# Patient Record
Sex: Female | Born: 1955 | Race: White | Hispanic: No | Smoking: Former smoker
Health system: Southern US, Community
[De-identification: ages and names within clinical notes are randomized; demographics above are authoritative.]

## PROBLEM LIST (undated history)

## (undated) DIAGNOSIS — E119 Type 2 diabetes mellitus without complications: Secondary | ICD-10-CM

## (undated) DIAGNOSIS — B019 Varicella without complication: Secondary | ICD-10-CM

## (undated) DIAGNOSIS — F32A Depression, unspecified: Secondary | ICD-10-CM

## (undated) DIAGNOSIS — I10 Essential (primary) hypertension: Secondary | ICD-10-CM

## (undated) DIAGNOSIS — E785 Hyperlipidemia, unspecified: Secondary | ICD-10-CM

## (undated) DIAGNOSIS — R32 Unspecified urinary incontinence: Secondary | ICD-10-CM

## (undated) DIAGNOSIS — I639 Cerebral infarction, unspecified: Secondary | ICD-10-CM

## (undated) DIAGNOSIS — K219 Gastro-esophageal reflux disease without esophagitis: Secondary | ICD-10-CM

## (undated) DIAGNOSIS — Z85528 Personal history of other malignant neoplasm of kidney: Secondary | ICD-10-CM

## (undated) DIAGNOSIS — F329 Major depressive disorder, single episode, unspecified: Secondary | ICD-10-CM

## (undated) DIAGNOSIS — C801 Malignant (primary) neoplasm, unspecified: Secondary | ICD-10-CM

## (undated) DIAGNOSIS — K635 Polyp of colon: Secondary | ICD-10-CM

## (undated) HISTORY — DX: Hyperlipidemia, unspecified: E78.5

## (undated) HISTORY — DX: Malignant (primary) neoplasm, unspecified: C80.1

## (undated) HISTORY — DX: Gastro-esophageal reflux disease without esophagitis: K21.9

## (undated) HISTORY — DX: Varicella without complication: B01.9

## (undated) HISTORY — DX: Type 2 diabetes mellitus without complications: E11.9

## (undated) HISTORY — DX: Depression, unspecified: F32.A

## (undated) HISTORY — DX: Unspecified urinary incontinence: R32

## (undated) HISTORY — DX: Major depressive disorder, single episode, unspecified: F32.9

## (undated) HISTORY — DX: Personal history of other malignant neoplasm of kidney: Z85.528

## (undated) HISTORY — DX: Polyp of colon: K63.5

## (undated) HISTORY — DX: Essential (primary) hypertension: I10

## (undated) HISTORY — PX: NISSEN FUNDOPLICATION: SHX2091

## (undated) HISTORY — DX: Cerebral infarction, unspecified: I63.9

## (undated) HISTORY — PX: KNEE SURGERY: SHX244

---

## 1999-09-03 HISTORY — PX: CHOLECYSTECTOMY: SHX55

## 2000-08-05 ENCOUNTER — Inpatient Hospital Stay (HOSPITAL_COMMUNITY)
Admission: RE | Admit: 2000-08-05 | Discharge: 2000-08-12 | Payer: Self-pay | Admitting: Physical Medicine & Rehabilitation

## 2003-07-14 ENCOUNTER — Other Ambulatory Visit: Payer: Self-pay

## 2004-12-19 ENCOUNTER — Ambulatory Visit: Payer: Self-pay

## 2005-02-06 ENCOUNTER — Ambulatory Visit: Payer: Self-pay

## 2005-02-21 ENCOUNTER — Ambulatory Visit: Payer: Self-pay

## 2005-03-29 ENCOUNTER — Other Ambulatory Visit: Payer: Self-pay

## 2005-04-05 ENCOUNTER — Ambulatory Visit: Payer: Self-pay | Admitting: General Practice

## 2005-07-17 ENCOUNTER — Ambulatory Visit: Payer: Self-pay

## 2005-08-16 ENCOUNTER — Ambulatory Visit: Payer: Self-pay | Admitting: General Practice

## 2006-01-27 ENCOUNTER — Ambulatory Visit: Payer: Self-pay | Admitting: Gastroenterology

## 2006-07-10 ENCOUNTER — Ambulatory Visit: Payer: Self-pay

## 2006-08-11 ENCOUNTER — Inpatient Hospital Stay: Payer: Self-pay | Admitting: General Practice

## 2006-09-04 ENCOUNTER — Encounter: Payer: Self-pay | Admitting: General Practice

## 2007-07-01 ENCOUNTER — Ambulatory Visit: Payer: Self-pay

## 2007-07-14 ENCOUNTER — Ambulatory Visit: Payer: Self-pay | Admitting: Internal Medicine

## 2007-08-28 ENCOUNTER — Ambulatory Visit: Payer: Self-pay | Admitting: General Practice

## 2007-09-01 ENCOUNTER — Ambulatory Visit: Payer: Self-pay | Admitting: General Practice

## 2007-09-08 ENCOUNTER — Inpatient Hospital Stay: Payer: Self-pay | Admitting: General Practice

## 2007-09-30 ENCOUNTER — Encounter: Payer: Self-pay | Admitting: General Practice

## 2007-10-04 ENCOUNTER — Encounter: Payer: Self-pay | Admitting: General Practice

## 2007-11-01 ENCOUNTER — Encounter: Payer: Self-pay | Admitting: General Practice

## 2008-04-06 ENCOUNTER — Ambulatory Visit: Payer: Self-pay | Admitting: Internal Medicine

## 2008-08-30 ENCOUNTER — Ambulatory Visit: Payer: Self-pay | Admitting: Internal Medicine

## 2010-05-16 ENCOUNTER — Ambulatory Visit: Payer: Self-pay | Admitting: Family Medicine

## 2010-07-16 ENCOUNTER — Ambulatory Visit: Payer: Self-pay | Admitting: Gastroenterology

## 2010-07-18 LAB — PATHOLOGY REPORT

## 2011-06-25 ENCOUNTER — Ambulatory Visit: Payer: Self-pay | Admitting: Family Medicine

## 2012-06-26 ENCOUNTER — Ambulatory Visit (INDEPENDENT_AMBULATORY_CARE_PROVIDER_SITE_OTHER): Payer: 59 | Admitting: Internal Medicine

## 2012-06-26 ENCOUNTER — Encounter: Payer: Self-pay | Admitting: Internal Medicine

## 2012-06-26 VITALS — BP 140/80 | HR 64 | Temp 98.7°F | Ht 69.0 in | Wt 259.5 lb

## 2012-06-26 DIAGNOSIS — K589 Irritable bowel syndrome without diarrhea: Secondary | ICD-10-CM

## 2012-06-26 DIAGNOSIS — R739 Hyperglycemia, unspecified: Secondary | ICD-10-CM

## 2012-06-26 DIAGNOSIS — Z139 Encounter for screening, unspecified: Secondary | ICD-10-CM

## 2012-06-26 DIAGNOSIS — E119 Type 2 diabetes mellitus without complications: Secondary | ICD-10-CM

## 2012-06-26 DIAGNOSIS — I10 Essential (primary) hypertension: Secondary | ICD-10-CM

## 2012-06-26 DIAGNOSIS — R5383 Other fatigue: Secondary | ICD-10-CM

## 2012-06-26 DIAGNOSIS — K219 Gastro-esophageal reflux disease without esophagitis: Secondary | ICD-10-CM

## 2012-06-26 DIAGNOSIS — E7800 Pure hypercholesterolemia, unspecified: Secondary | ICD-10-CM

## 2012-06-26 DIAGNOSIS — R7309 Other abnormal glucose: Secondary | ICD-10-CM

## 2012-06-26 DIAGNOSIS — R5381 Other malaise: Secondary | ICD-10-CM

## 2012-06-26 DIAGNOSIS — Z23 Encounter for immunization: Secondary | ICD-10-CM

## 2012-06-26 NOTE — Patient Instructions (Signed)
It was nice meeting you today.  We are going to get an appt scheduled to follow up with Dr. Bluford Kaufmann.  Also, we will get your labs scheduled.  We will notify you of the results once they become available.

## 2012-06-28 ENCOUNTER — Encounter: Payer: Self-pay | Admitting: Internal Medicine

## 2012-06-28 DIAGNOSIS — E7800 Pure hypercholesterolemia, unspecified: Secondary | ICD-10-CM | POA: Insufficient documentation

## 2012-06-28 DIAGNOSIS — E119 Type 2 diabetes mellitus without complications: Secondary | ICD-10-CM | POA: Insufficient documentation

## 2012-06-28 DIAGNOSIS — K589 Irritable bowel syndrome without diarrhea: Secondary | ICD-10-CM | POA: Insufficient documentation

## 2012-06-28 DIAGNOSIS — K219 Gastro-esophageal reflux disease without esophagitis: Secondary | ICD-10-CM | POA: Insufficient documentation

## 2012-06-28 DIAGNOSIS — I10 Essential (primary) hypertension: Secondary | ICD-10-CM | POA: Insufficient documentation

## 2012-06-28 NOTE — Assessment & Plan Note (Signed)
With persistent diarrhea.  States bowels are stable.  Continues follow up with Dr Bluford Kaufmann.

## 2012-06-28 NOTE — Assessment & Plan Note (Signed)
Blood pressure elevated.  Have her spot check her pressures.  Get her back in soon to reassess.  If persistent elevation - will adjust meds.  Check metabolic panel.

## 2012-06-28 NOTE — Assessment & Plan Note (Signed)
States she is a "pre diabetic".  Low carb diet.  Weight loss.  Check met b and a1c.

## 2012-06-28 NOTE — Assessment & Plan Note (Signed)
Severe reflux and regurgitation - persistent.  Has seen Dr Bluford Kaufmann.  Has desired to postpone surgery.  Discussed at length with her today.  She desires referral back to Dr Bluford Kaufmann to discuss treatment.  Continues on Protonix.  Referral made.

## 2012-06-28 NOTE — Progress Notes (Signed)
Subjective:    Patient ID: Jacqueline Nichols, female    DOB: 04-08-1956, 56 y.o.   MRN: [id number]  HPI 56 year old female with past history of brainstem CVA, hypertension, "borderline diabetes", GERD and hypercholesterolemia who comes in today to follow up on these issues as well as to establish care.  She was previously being followed at [facility] by Gearldine Bienenstock.  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  Still some residual right sided changes, but overall has done well.  Has a lot of trouble with GERD and regurgitation.  Has been worked up extensively by Dr Bluford Kaufmann.  Needs surgery.  She has elected to postpone.  States the problem occurs with most every meal.  A lot of trouble with food regurgitation.  Also is lactose intolerant and has issues with IBS.  States has diarrhea - with 4-5 stools per day.  Unchanged.   Also sees Dr Ernest Pine.  Has had multiple knee surgeries.  Takes Mobic daily.  Discussed need to stop antiinflammatories.  Discussed using Tylenol instead.  She also describes a left side pain - upper left abdomen/flank.  States has been present since 1990.  Past Medical History  Diagnosis Date  . Cancer     skin  . Depression   . Diabetes mellitus   . GERD (gastroesophageal reflux disease)   . Hypertension   . Hyperlipidemia   . Chicken pox   . Colon polyps   . Stroke     brainstem  . Urinary incontinence     Review of Systems Patient denies any headache, lightheadedness or dizziness.  No chest pain, tightness or palpitations. No increased shortness of breath, cough or congestion.  No nausea or vomiting, but does have increased acid reflux and regurgitation as outlined above.  Abdomen and left side pain as outlined.  Loose stool and bowel issues as outlined.  No urine change.   Last period in her late 13s.  Is due for mammogram in November.  No previous abnormal paps or mammograms.       Objective:   Physical Exam Filed Vitals:   06/26/12 1521  BP: 140/80  Pulse:  64  Temp: 98.7 F (10.19 C)   56 year old female in no acute distress.   HEENT:  Nares - clear.  OP- without lesions or erythema.  NECK:  Supple, nontender.  No audible carotid bruit.   HEART:  Appears to be regular. LUNGS:  Without crackles or wheezing audible.  Respirations even and unlabored.   RADIAL PULSE:  Equal bilaterally.  ABDOMEN:  Soft, nontender.  No audible abdominal bruit.  No significant pain to palpation left side.   EXTREMITIES:  No increased edema to be present.                     Assessment & Plan:  GYN.  Postmenopausal.  Up to date with pap smears.  Will schedule for a pelvic/pap next visit.   ORTHO.  Knee pain as outlined.  Sees Dr Ernest Pine.  Is s/p eight knee surgeries.  Takes Mobic.  Discussed need to stop antiinflammatories.  Tylenol prn as directed.    LEFT SIDE PAIN.  No abnormality noted on exam.  Present for years.  Discussed possible abdominal ultrasound.  Will follow.  Hold on further w/up at this point.  Check routine labs.    FATIGUE.  Check cbc, met c and tsh.   HEALTH MAINTENANCE.  Schedule a physical for next visit.  Check cholesterol.  Schedule a mammogram.  States up to date with her colonoscopy.

## 2012-06-28 NOTE — Assessment & Plan Note (Signed)
On Simvastatin.  Check lipid panel and liver function.  Low cholesterol diet.

## 2012-06-30 ENCOUNTER — Telehealth: Payer: Self-pay | Admitting: Internal Medicine

## 2012-06-30 NOTE — Telephone Encounter (Signed)
Refill request for clonazepam 0.5 mg tab #60 Sig: take one tablet twice daily as needed Patient was seen on 06/26/12

## 2012-06-30 NOTE — Telephone Encounter (Signed)
Ok x 2  

## 2012-07-01 ENCOUNTER — Other Ambulatory Visit: Payer: Self-pay | Admitting: *Deleted

## 2012-07-01 MED ORDER — CLONAZEPAM 0.5 MG PO TABS: 0.5000 mg | ORAL_TABLET | Freq: Two times a day (BID) | ORAL | Status: DC | PRN

## 2012-07-01 NOTE — Telephone Encounter (Signed)
Refill request complete 

## 2012-07-02 ENCOUNTER — Telehealth: Payer: Self-pay | Admitting: *Deleted

## 2012-07-02 NOTE — Telephone Encounter (Signed)
Called prescription in to patient's regular pharmacy [facility] [address]. However Patient wanted her prescription for clonazepam called in to [facility]. I canceled with [facility] and called into [facility]. Patient informed.

## 2012-07-02 NOTE — Telephone Encounter (Signed)
Called patient to let her know that we are mailing her [facility] form to her home address.

## 2012-07-07 ENCOUNTER — Other Ambulatory Visit: Payer: Self-pay | Admitting: Internal Medicine

## 2012-07-08 LAB — COMPREHENSIVE METABOLIC PANEL
ALT: 15 IU/L (ref 0–32)
Albumin/Globulin Ratio: 1.7 (ref 1.1–2.5)
CO2: 22 mmol/L (ref 19–28)
Calcium: 9.1 mg/dL (ref 8.7–10.2)
Chloride: 100 mmol/L (ref 97–108)
GFR calc non Af Amer: 78 mL/min/{1.73_m2} (ref >59)
Glucose: 98 mg/dL (ref 65–99)
Potassium: 4 mmol/L (ref 3.5–5.2)
Total Protein: 6.3 g/dL (ref 6.0–8.5)

## 2012-07-08 LAB — CBC WITH DIFFERENTIAL
Basos: 0 % (ref 0–3)
Eosinophils Absolute: 0.1 10*3/uL (ref 0.0–0.4)
Hemoglobin: 10.5 g/dL — ABNORMAL LOW (ref 11.1–15.9)
Immature Grans (Abs): 0 10*3/uL (ref 0.0–0.1)
Immature Granulocytes: 0 % (ref 0–2)
Lymphs: 25 % (ref 14–46)
MCHC: 31.4 g/dL — ABNORMAL LOW (ref 31.5–35.7)
Neutrophils Relative %: 69 % (ref 40–74)
Platelets: 276 10*3/uL (ref 155–379)
RBC: 4.67 x10E6/uL (ref 3.77–5.28)

## 2012-07-08 LAB — TSH: TSH: 1.18 u[IU]/mL (ref 0.450–4.500)

## 2012-07-08 LAB — MICROALBUMIN / CREATININE URINE RATIO
Microalb Creat Ratio: 5.9 mg/g creat (ref 0.0–30.0)
Microalbumin, Urine: 9.6 ug/mL (ref 0.0–17.0)

## 2012-07-08 LAB — LIPID PANEL W/O CHOL/HDL RATIO
Cholesterol, Total: 242 mg/dL — ABNORMAL HIGH (ref 100–199)
HDL: 41 mg/dL (ref >39)
VLDL Cholesterol Cal: 33 mg/dL (ref 5–40)

## 2012-07-08 LAB — URINALYSIS, ROUTINE W REFLEX MICROSCOPIC
Bilirubin, UA: NEGATIVE
Nitrite, UA: NEGATIVE
Specific Gravity, UA: 1.022 (ref 1.005–1.030)
Urobilinogen, Ur: 0.2 mg/dL (ref 0.0–1.9)
pH, UA: 8.5 — ABNORMAL HIGH (ref 5.0–7.5)

## 2012-07-08 LAB — MICROSCOPIC EXAMINATION: Bacteria, UA: NONE SEEN

## 2012-07-11 ENCOUNTER — Other Ambulatory Visit: Payer: Self-pay | Admitting: Internal Medicine

## 2012-07-11 DIAGNOSIS — D649 Anemia, unspecified: Secondary | ICD-10-CM

## 2012-07-13 ENCOUNTER — Telehealth: Payer: Self-pay | Admitting: Internal Medicine

## 2012-07-13 MED ORDER — SIMVASTATIN 20 MG PO TABS: 20.0000 mg | ORAL_TABLET | Freq: Every day | ORAL | Status: DC

## 2012-07-13 NOTE — Telephone Encounter (Signed)
Pt takes 20 mg's once daily of the Symbestatin ??

## 2012-07-13 NOTE — Telephone Encounter (Signed)
Added med to med list.  Pt states she did not need refills.

## 2012-07-14 ENCOUNTER — Ambulatory Visit: Payer: Self-pay | Admitting: Internal Medicine

## 2012-07-17 ENCOUNTER — Ambulatory Visit: Payer: Self-pay | Admitting: Gastroenterology

## 2012-07-22 ENCOUNTER — Telehealth: Payer: Self-pay | Admitting: Internal Medicine

## 2012-07-22 NOTE — Telephone Encounter (Signed)
Lab order form completed and placed on your desk.  Also notify pt that I received her mammogram results and it is ok.

## 2012-07-22 NOTE — Telephone Encounter (Signed)
Patient needing an order for lab work at [facility].

## 2012-07-22 NOTE — Telephone Encounter (Signed)
Called patient to let her know. Patient wants [place] to fax lab to [facility]. Faxed and mailed a copy to patients home address. Patient notified.

## 2012-07-24 ENCOUNTER — Telehealth: Payer: Self-pay | Admitting: *Deleted

## 2012-07-24 ENCOUNTER — Other Ambulatory Visit: Payer: Self-pay | Admitting: Internal Medicine

## 2012-07-24 DIAGNOSIS — D649 Anemia, unspecified: Secondary | ICD-10-CM

## 2012-07-24 NOTE — Telephone Encounter (Signed)
Patient called and stated that [facility] did not receive fax and patient has not received in mail as of today

## 2012-07-31 ENCOUNTER — Encounter: Payer: Self-pay | Admitting: Internal Medicine

## 2012-08-04 NOTE — Telephone Encounter (Signed)
Encounter complete. 

## 2012-08-18 ENCOUNTER — Encounter: Payer: 59 | Admitting: Internal Medicine

## 2012-08-19 ENCOUNTER — Telehealth: Payer: Self-pay | Admitting: Internal Medicine

## 2012-08-19 NOTE — Telephone Encounter (Signed)
Notify pt hgb is stable.  Still low but stable.  Iron low.  Needs to start iron supplements if not already taking.  Can start ferrous sulfate 325mg  q day.  Would like to recheck hgb and ferritin in one month.   I will sign order for lab.  She gets her labs at [facility].

## 2012-08-20 ENCOUNTER — Ambulatory Visit: Payer: Self-pay | Admitting: Internal Medicine

## 2012-08-20 ENCOUNTER — Telehealth: Payer: Self-pay | Admitting: Internal Medicine

## 2012-08-20 NOTE — Telephone Encounter (Signed)
Called patient to let her know. Will mail lab request to patient

## 2012-08-20 NOTE — Telephone Encounter (Signed)
Plavix 75 mg  90 supply

## 2012-08-21 MED ORDER — CLOPIDOGREL BISULFATE 75 MG PO TABS: 75.0000 mg | ORAL_TABLET | Freq: Every day | ORAL | Status: DC

## 2012-08-21 NOTE — Telephone Encounter (Signed)
Called prescription in to pharmacy 

## 2012-09-09 ENCOUNTER — Ambulatory Visit: Payer: Self-pay | Admitting: Surgery

## 2012-09-22 ENCOUNTER — Emergency Department: Payer: Self-pay | Admitting: Emergency Medicine

## 2012-09-22 LAB — COMPREHENSIVE METABOLIC PANEL
Albumin: 3.9 g/dL (ref 3.4–5.0)
Alkaline Phosphatase: 136 U/L (ref 50–136)
Anion Gap: 9 (ref 7–16)
BUN: 18 mg/dL (ref 7–18)
Bilirubin,Total: 0.5 mg/dL (ref 0.2–1.0)
Calcium, Total: 8.9 mg/dL (ref 8.5–10.1)
Chloride: 102 mmol/L (ref 98–107)
Creatinine: 0.87 mg/dL (ref 0.60–1.30)
Osmolality: 271 (ref 275–301)
Total Protein: 7.6 g/dL (ref 6.4–8.2)

## 2012-09-22 LAB — URINALYSIS, COMPLETE
Bilirubin,UR: NEGATIVE
Nitrite: NEGATIVE
Protein: NEGATIVE
RBC,UR: <1 /HPF (ref 0–5)
Specific Gravity: 1.003 (ref 1.003–1.030)
Transitional Epi: <1
WBC UR: NONE SEEN /HPF (ref 0–5)

## 2012-09-22 LAB — CK TOTAL AND CKMB (NOT AT ARMC)
CK, Total: 91 U/L (ref 21–215)
CK-MB: <0.5 ng/mL — ABNORMAL LOW (ref 0.5–3.6)

## 2012-09-22 LAB — CBC
HCT: 35.6 % (ref 35.0–47.0)
Platelet: 361 10*3/uL (ref 150–440)
RDW: 17.7 % — ABNORMAL HIGH (ref 11.5–14.5)

## 2012-10-21 ENCOUNTER — Other Ambulatory Visit (HOSPITAL_COMMUNITY)
Admission: RE | Admit: 2012-10-21 | Discharge: 2012-10-21 | Disposition: A | Discharge status: Home / Self Care | Payer: 59 | Source: Ambulatory Visit | Attending: Internal Medicine | Admitting: Internal Medicine

## 2012-10-21 ENCOUNTER — Ambulatory Visit (INDEPENDENT_AMBULATORY_CARE_PROVIDER_SITE_OTHER): Payer: 59 | Admitting: Internal Medicine

## 2012-10-21 ENCOUNTER — Encounter: Payer: Self-pay | Admitting: Internal Medicine

## 2012-10-21 VITALS — BP 128/70 | HR 55 | Temp 98.3°F | Ht 69.0 in | Wt 253.2 lb

## 2012-10-21 DIAGNOSIS — K219 Gastro-esophageal reflux disease without esophagitis: Secondary | ICD-10-CM

## 2012-10-21 DIAGNOSIS — I10 Essential (primary) hypertension: Secondary | ICD-10-CM

## 2012-10-21 DIAGNOSIS — N841 Polyp of cervix uteri: Secondary | ICD-10-CM

## 2012-10-21 DIAGNOSIS — R000 Tachycardia, unspecified: Secondary | ICD-10-CM

## 2012-10-21 DIAGNOSIS — E119 Type 2 diabetes mellitus without complications: Secondary | ICD-10-CM

## 2012-10-21 DIAGNOSIS — D649 Anemia, unspecified: Secondary | ICD-10-CM

## 2012-10-21 DIAGNOSIS — Z1151 Encounter for screening for human papillomavirus (HPV): Secondary | ICD-10-CM | POA: Insufficient documentation

## 2012-10-21 DIAGNOSIS — K589 Irritable bowel syndrome without diarrhea: Secondary | ICD-10-CM

## 2012-10-21 DIAGNOSIS — Z0000 Encounter for general adult medical examination without abnormal findings: Secondary | ICD-10-CM

## 2012-10-21 DIAGNOSIS — Z01419 Encounter for gynecological examination (general) (routine) without abnormal findings: Secondary | ICD-10-CM | POA: Insufficient documentation

## 2012-10-21 DIAGNOSIS — E7800 Pure hypercholesterolemia, unspecified: Secondary | ICD-10-CM

## 2012-10-21 DIAGNOSIS — N390 Urinary tract infection, site not specified: Secondary | ICD-10-CM

## 2012-10-21 LAB — POCT URINALYSIS DIPSTICK
Blood, UA: NEGATIVE
Glucose, UA: NEGATIVE
Nitrite, UA: NEGATIVE
Protein, UA: NEGATIVE
Spec Grav, UA: 1.015
Urobilinogen, UA: 1
pH, UA: 7

## 2012-10-21 MED ORDER — CLOTRIMAZOLE-BETAMETHASONE 1-0.05 % EX CREA: TOPICAL_CREAM | Freq: Two times a day (BID) | CUTANEOUS | Status: DC

## 2012-10-23 ENCOUNTER — Ambulatory Visit: Payer: Self-pay | Admitting: Internal Medicine

## 2012-10-25 ENCOUNTER — Encounter: Payer: Self-pay | Admitting: Internal Medicine

## 2012-10-25 DIAGNOSIS — D649 Anemia, unspecified: Secondary | ICD-10-CM | POA: Insufficient documentation

## 2012-10-25 DIAGNOSIS — R000 Tachycardia, unspecified: Secondary | ICD-10-CM | POA: Insufficient documentation

## 2012-10-25 NOTE — Assessment & Plan Note (Signed)
Low carb diet.  Weight loss.  A1c 07/07/12 - 5.5.  Follow.

## 2012-10-25 NOTE — Assessment & Plan Note (Signed)
Severe reflux and regurgitation - persistent.  Has seen Dr Bluford Kaufmann.  Referred to Dr Katrinka Blazing.  See above.  Planning for lap hernia repair and fundoplication.  Awaiting manometry results.  Continue protonix.

## 2012-10-25 NOTE — Assessment & Plan Note (Signed)
Fasting lipid profile 07/07/12 revealed total cholesterol 242, triglycerides 167, HDL 41 and LDL 168.  Back on cholesterol medication now.  Follow lipid profile and liver function.

## 2012-10-25 NOTE — Assessment & Plan Note (Signed)
Blood pressure doing well now.  Continue to monitor.  Same medication regimen.

## 2012-10-25 NOTE — Assessment & Plan Note (Signed)
Last hgb 10.5.  Recheck cbc and iron studies to confirm stable.

## 2012-10-25 NOTE — Assessment & Plan Note (Signed)
Increased heart rate as outlined.  Seeing Dr Juliann Pares.  Obtain records.  Planning for stress test - end of this week.  Continue metoprolol.

## 2012-10-25 NOTE — Progress Notes (Signed)
Subjective:    Patient ID: Jacqueline Nichols, female    DOB: 1956-08-26, 57 y.o.   MRN: [id number]  HPI 57 year old female with past history of brainstem CVA, hypertension, "borderline diabetes", GERD and hypercholesterolemia who comes in today to follow up on these issues as well as for her complete physical exam.   Has had a lot of issues with GERD and swallowing.  See last note for details.  Was referred back to GI.  They referred her to Dr Katrinka Blazing.  Dr Katrinka Blazing ordered an UGI which revealed a small hiatal hernia and no acid reflux.  Gastric emptying was normal.  He recommended a lap hernia repair with fundoplication.  Scheduled a pre op manometry and a gastric emptying study.   She has had both.  Do not have results.  States she is waiting to hear from the manometry.  Does plan to follow through with the surgery.  On protonix.  Breathing stable.  Bowels stable.  No vaginal bleeding or problems.     Past Medical History  Diagnosis Date  . Cancer     skin  . Depression   . Diabetes mellitus   . GERD (gastroesophageal reflux disease)   . Hypertension   . Hyperlipidemia   . Chicken pox   . Colon polyps   . Stroke     brainstem  . Urinary incontinence     Current Outpatient Prescriptions on File Prior to Visit  Medication Sig Dispense Refill  . bisoprolol-hydrochlorothiazide (ZIAC) 2.5-6.25 MG per tablet Take 1 tablet by mouth daily.      . citalopram (CELEXA) 40 MG tablet Take 40 mg by mouth daily.      . clonazePAM (KLONOPIN) 0.5 MG tablet Take 1 tablet (0.5 mg total) by mouth 2 (two) times daily as needed.  30 tablet  2  . clopidogrel (PLAVIX) 75 MG tablet Take 1 tablet (75 mg total) by mouth daily.  90 tablet  1  . fluticasone (VERAMYST) 27.5 MCG/SPRAY nasal spray Place 2 sprays into the nose as needed.      . Misc Natural Products (ALLERGY RELEAF SYSTEM PO) Take by mouth. 24 hour relief      . pantoprazole (PROTONIX) 40 MG tablet Take 40 mg by mouth 2 (two) times daily.      . simvastatin  (ZOCOR) 20 MG tablet Take 1 tablet (20 mg total) by mouth at bedtime.  90 tablet  3  . torsemide (DEMADEX) 20 MG tablet Take 20 mg by mouth daily. As needed       No current facility-administered medications on file prior to visit.    Review of Systems Patient denies any headache, lightheadedness or dizziness.  While at work recently, she noticed increased palpitations.  Couldn't get a deep breath.  Lasted for a few hours.  Started feeling some heaviness.  Jaw discomfort.  Went to ER.  They got her rate under control  (rate on arrival - 160).  Sounds like she was given adenosine.  Followed up with Dr Juliann Pares.  Had ECHO.  Due to have stress test at the end of this week.   Started on metoprolol.  Having no acute symptoms now.  No increased shortness of breath now.  No cough or congestion.  No nausea or vomiting, but does have increased acid reflux and regurgitation as outlined above.  Bowels - she feels are stable.  No urine change.   Last period in her late 30s.  No vaginal bleeding or problems.  No previous abnormal paps or mammograms.       Objective:   Physical Exam  Filed Vitals:   10/21/12 1004  BP: 128/70  Pulse: 55  Temp: 98.3 F (36.8 C)   Blood pressure recheck:  122/72, pulse 63-30  57 year old female in no acute distress.   HEENT:  Nares- clear.  Oropharynx - without lesions. NECK:  Supple.  Nontender.  No audible bruit.  HEART:  Appears to be regular. LUNGS:  No crackles or wheezing audible.  Respirations even and unlabored.  RADIAL PULSE:  Equal bilaterally.    BREASTS:  No nipple discharge or nipple retraction present.  Could not appreciate any distinct nodules or axillary adenopathy.  ABDOMEN:  Soft, nontender.  Bowel sounds present and normal.  No audible abdominal bruit.  GU:  Normal external genitalia.  Vaginal vault without lesions.  Cervix identified.  Pap performed. Cervical polyp present.  Could not appreciate any adnexal masses or tenderness.   RECTAL:  Heme  negative.   EXTREMITIES:  No increased edema present.  DP pulses palpable and equal bilaterally.            Assessment & Plan:  GYN.  Postmenopausal.  No vaginal bleeding or spotting.  Pap today.  Cervical polyp present.  Refer to gyn for removal.     ORTHO.  Knee pain as outlined in previous note.  Sees Dr Ernest Pine.  Is s/p eight knee surgeries.  Appears to be off mobic.  Tylenol prn as directed.      HEALTH MAINTENANCE.  Physical today.  Pap today.  Mammogram 07/14/12 - BiRADS II.  States up to date with her colonoscopy.

## 2012-10-25 NOTE — Assessment & Plan Note (Signed)
Bowels stable currently.  Follow.  Sees GI.

## 2012-10-27 ENCOUNTER — Telehealth: Payer: Self-pay | Admitting: *Deleted

## 2012-10-27 MED ORDER — AMOXICILLIN 875 MG PO TABS: 875.0000 mg | ORAL_TABLET | Freq: Two times a day (BID) | ORAL | Status: DC

## 2012-10-27 NOTE — Telephone Encounter (Signed)
Patient informed of lab results and rx sent to pharmacy on file per patient request.

## 2012-10-27 NOTE — Telephone Encounter (Signed)
Message copied by Theola Sequin on Tue Oct 27, 2012  4:27 PM ------      Message from: Charm Barges      Created: Mon Oct 26, 2012  3:17 AM       Notify pt urine cx positive.  Confirm no allergy to penicillin or amoxicillin.  If no, then amoxicillin 875mg  bid x 1 weeks.  Also pap ok. ------

## 2012-11-03 ENCOUNTER — Telehealth: Payer: Self-pay | Admitting: Internal Medicine

## 2012-11-03 NOTE — Telephone Encounter (Signed)
simvastatin (ZOCOR) 20 MG tablet  #90   torsemide (DEMADEX) 20 MG tablet  #90

## 2012-11-04 ENCOUNTER — Telehealth: Payer: Self-pay | Admitting: Internal Medicine

## 2012-11-04 ENCOUNTER — Other Ambulatory Visit: Payer: Self-pay | Admitting: *Deleted

## 2012-11-04 MED ORDER — TORSEMIDE 20 MG PO TABS: 20.0000 mg | ORAL_TABLET | Freq: Every day | ORAL | Status: DC

## 2012-11-04 MED ORDER — SIMVASTATIN 20 MG PO TABS: 20.0000 mg | ORAL_TABLET | Freq: Every day | ORAL | Status: DC

## 2012-11-04 NOTE — Telephone Encounter (Signed)
Called to confirm Rx for simvastatin and torsemide.  Did confirm from med list.

## 2012-11-04 NOTE — Telephone Encounter (Signed)
Already sent in to pharmacy. 

## 2012-11-04 NOTE — Telephone Encounter (Signed)
Script for zocor and torsemide filled

## 2012-11-04 NOTE — Telephone Encounter (Signed)
Noted  

## 2012-11-12 ENCOUNTER — Encounter: Payer: Self-pay | Admitting: Internal Medicine

## 2012-11-18 ENCOUNTER — Telehealth: Payer: Self-pay | Admitting: Internal Medicine

## 2012-11-18 ENCOUNTER — Other Ambulatory Visit: Payer: Self-pay | Admitting: Internal Medicine

## 2012-11-18 NOTE — Telephone Encounter (Signed)
Pt states she was seen a couple weeks ago and has a UTI.  Pt states she has finished her antibiotic but does not think it worked.  Pt asking if we can call in something different to [facility] today because this has been going on for a good while.

## 2012-11-18 NOTE — Telephone Encounter (Signed)
The urine culture was obtained on 2/19.  This has been a month.  If she is still having symptoms - she needs reevaluated.  If urgent, sx - eval today.  If not, will need to find a work in spot where I can see her for this problem.

## 2012-11-19 NOTE — Telephone Encounter (Signed)
Pt returned call 3/19 at 5:pm.  Pt states she cannot come in until Friday and has to be 1:30 p.m. Or later.   Please advise if Dr. Lorin Picket can work her in and I can add her to the schedule.

## 2012-11-19 NOTE — Telephone Encounter (Signed)
Please advise 

## 2012-11-19 NOTE — Telephone Encounter (Signed)
I can see her at 1:30 tomorrow - Friday (11/20/12).  Work in for this.  Will need to give urine sample prior to me seeing

## 2012-11-19 NOTE — Telephone Encounter (Signed)
Pt aware of appointment 

## 2012-11-20 ENCOUNTER — Encounter: Payer: Self-pay | Admitting: Internal Medicine

## 2012-11-20 ENCOUNTER — Ambulatory Visit (INDEPENDENT_AMBULATORY_CARE_PROVIDER_SITE_OTHER): Payer: 59 | Admitting: Internal Medicine

## 2012-11-20 VITALS — BP 110/70 | HR 56 | Temp 98.6°F | Ht 69.0 in | Wt 256.5 lb

## 2012-11-20 DIAGNOSIS — K219 Gastro-esophageal reflux disease without esophagitis: Secondary | ICD-10-CM

## 2012-11-20 DIAGNOSIS — I10 Essential (primary) hypertension: Secondary | ICD-10-CM

## 2012-11-20 DIAGNOSIS — E119 Type 2 diabetes mellitus without complications: Secondary | ICD-10-CM

## 2012-11-20 DIAGNOSIS — N390 Urinary tract infection, site not specified: Secondary | ICD-10-CM

## 2012-11-20 DIAGNOSIS — L989 Disorder of the skin and subcutaneous tissue, unspecified: Secondary | ICD-10-CM

## 2012-11-20 DIAGNOSIS — D649 Anemia, unspecified: Secondary | ICD-10-CM

## 2012-11-20 LAB — POCT URINALYSIS DIPSTICK
Bilirubin, UA: NEGATIVE
Ketones, UA: NEGATIVE
Protein, UA: NEGATIVE
pH, UA: 6.5

## 2012-11-20 MED ORDER — NITROFURANTOIN MONOHYD MACRO 100 MG PO CAPS: 100.0000 mg | ORAL_CAPSULE | Freq: Two times a day (BID) | ORAL | Status: DC

## 2012-11-22 ENCOUNTER — Encounter: Payer: Self-pay | Admitting: Internal Medicine

## 2012-11-22 LAB — URINE CULTURE

## 2012-11-22 NOTE — Assessment & Plan Note (Signed)
Severe reflux and regurgitation - persistent.  Has seen Dr Bluford Kaufmann.  Referred to Dr Katrinka Blazing.  See above.  Planning for lap hernia repair and fundoplication.   Continue protonix.

## 2012-11-22 NOTE — Assessment & Plan Note (Signed)
Blood pressure doing well now.  Continue to monitor.  Same medication regimen.

## 2012-11-22 NOTE — Assessment & Plan Note (Signed)
Low carb diet.  Weight loss.  A1c 07/07/12 - 5.5.  Follow.

## 2012-11-22 NOTE — Assessment & Plan Note (Signed)
Last hgb 10.5.  Recheck cbc and iron studies to confirm stable.

## 2012-11-22 NOTE — Progress Notes (Addendum)
Subjective:    Patient ID: Jacqueline Nichols, female    DOB: [birth year], 57 y.o.   MRN: [id number]  Urinary Tract Infection   57 year old female with past history of brainstem CVA, hypertension, "borderline diabetes", GERD and hypercholesterolemia who comes in today as a work in with concerns regarding a urinary tract infection.  Evaluated by Dr Katrinka Blazing previously.  He recommended a lap hernia repair with fundoplication. On protonix.  Breathing stable.  Bowels stable.  No vaginal bleeding or problems.  She does report that starting approximately five days ago she noticed increased odor.  Urine cloudy.  Then developed dysuria.  Some increased discomfort over her bladder.  Drinking cranberry juice.  Has been a little better the last couple of days.  Is planning for the upcoming surgery and wants to get this cleared prior to the surgery.    Past Medical History  Diagnosis Date  . Cancer     skin  . Depression   . Diabetes mellitus   . GERD (gastroesophageal reflux disease)   . Hypertension   . Hyperlipidemia   . Chicken pox   . Colon polyps   . Stroke     brainstem  . Urinary incontinence     Current Outpatient Prescriptions on File Prior to Visit  Medication Sig Dispense Refill  . amoxicillin (AMOXIL) 875 MG tablet Take 1 tablet (875 mg total) by mouth 2 (two) times daily. Take 1 tablet twice a day for 7 days  14 tablet  0  . bisoprolol-hydrochlorothiazide (ZIAC) 2.5-6.25 MG per tablet Take 1 tablet by mouth daily.      . citalopram (CELEXA) 40 MG tablet Take 40 mg by mouth daily.      . clonazePAM (KLONOPIN) 0.5 MG tablet Take 1 tablet (0.5 mg total) by mouth 2 (two) times daily as needed.  30 tablet  2  . clopidogrel (PLAVIX) 75 MG tablet Take 1 tablet (75 mg total) by mouth daily.  90 tablet  1  . clotrimazole-betamethasone (LOTRISONE) cream Apply topically 2 (two) times daily.  30 g  0  . fluticasone (VERAMYST) 27.5 MCG/SPRAY nasal spray Place 2 sprays into the nose as needed.      .  Misc Natural Products (ALLERGY RELEAF SYSTEM PO) Take by mouth. 24 hour relief      . pantoprazole (PROTONIX) 40 MG tablet Take 40 mg by mouth 2 (two) times daily.      . simvastatin (ZOCOR) 20 MG tablet Take 1 tablet (20 mg total) by mouth at bedtime.  90 tablet  3  . torsemide (DEMADEX) 20 MG tablet Take 1 tablet (20 mg total) by mouth daily. As needed  30 tablet  2   No current facility-administered medications on file prior to visit.    Review of Systems Patient denies any headache, lightheadedness or dizziness.  No sinus or allergy symptoms.  Seeing Dr Juliann Pares for her palpitations.  See last note for details.  States her cardiac w/up checked out fine.  Does report the urinary symptoms as outlined.  No vaginal itching or discharge.  Now bowel change.  No abdominal pain or cramping.  She does report the suprapubic discomfort.  Better after AZO.  No back pain.       Objective:   Physical Exam  Filed Vitals:   11/20/12 1337  BP: 110/70  Pulse: 56  Temp: 98.6 F (98 C)   57 year old female in no acute distress. HEART:  Appears to be regular. LUNGS:  No crackles or wheezing audible.  Respirations even and unlabored.  RADIAL PULSE:  Equal bilaterally.     ABDOMEN:  Soft, nontender.  Bowel sounds present and normal.  No audible abdominal bruit.  BACK:  No CVA tenderness.  No back pain with palpation.   EXTREMITIES:  No increased edema present.  DP pulses palpable and equal bilaterally.            Assessment & Plan:  UTI.  Some changes on the urine dip c/w uti.   Stay hydrated.  Send for culture.  Treat with macrobid.  Follow.  Discussed possible need for urology referral if persistent reoccurring urinary tract infections.   GYN.  Postmenopausal.  No vaginal bleeding or spotting.  Was referred to GYN for cervical polyp.  Obtain records.    ORTHO.  Knee pain as outlined in previous note.  Sees Dr Ernest Pine.  Is s/p eight knee surgeries.  Appears to be off mobic.  Tylenol prn as directed.     DERMATOLOGY.  Persistent right leg lesion.  Refer to dermatology for evaluation.   HEALTH MAINTENANCE.  Physical last visit.  Mammogram 07/14/12 - BiRADS II.  States up to date with her colonoscopy.

## 2012-11-23 ENCOUNTER — Other Ambulatory Visit: Payer: Self-pay | Admitting: Internal Medicine

## 2012-11-23 DIAGNOSIS — N390 Urinary tract infection, site not specified: Secondary | ICD-10-CM

## 2012-11-23 NOTE — Progress Notes (Signed)
Order placed for follow up urine 

## 2012-12-04 ENCOUNTER — Other Ambulatory Visit (INDEPENDENT_AMBULATORY_CARE_PROVIDER_SITE_OTHER): Payer: 59

## 2012-12-04 DIAGNOSIS — N390 Urinary tract infection, site not specified: Secondary | ICD-10-CM

## 2012-12-04 LAB — POCT URINALYSIS DIPSTICK
Nitrite, UA: NEGATIVE
Protein, UA: NEGATIVE
Urobilinogen, UA: 0.2
pH, UA: 5.5

## 2012-12-06 LAB — URINE CULTURE: Organism ID, Bacteria: NO GROWTH

## 2012-12-14 ENCOUNTER — Telehealth: Payer: Self-pay

## 2012-12-14 NOTE — Telephone Encounter (Signed)
Patient was notified about her lab results... Hemoglobin has improved and to continue iron. Total and Body Cholesterol increased and patient stated that she is still taking her simvastatin 20 mg. Dr. Lorin Picket did you want to change to Lipitor 20 mg q day. She also wanted to know if we could send her lab results to Dr. Malissa Hippo at the [facility] office patient is suppose to have surgery. She uses [facility]

## 2012-12-15 MED ORDER — ATORVASTATIN CALCIUM 20 MG PO TABS: 20.0000 mg | ORAL_TABLET | Freq: Every day | ORAL | Status: DC

## 2012-12-15 NOTE — Telephone Encounter (Signed)
Notify pt I sent a prescription to [facility] for generic lipitor.  I want her to take this instead of her simvastatin.  Will need a follow up liver panel check in 6 weeks since starting a new cholesterol medication. [facility] form on desk for order.

## 2012-12-15 NOTE — Telephone Encounter (Signed)
Pt was told about order and she said she would like for me to mail it to her.

## 2012-12-15 NOTE — Addendum Note (Signed)
Addended by: Charm Barges on: 12/15/2012 04:04 AM   Modules accepted: Orders, Medications

## 2012-12-15 NOTE — Telephone Encounter (Signed)
Order on your desk to send to her or for her to pick up.

## 2012-12-15 NOTE — Telephone Encounter (Signed)
Called pt to let her know her medication of the Lipitor has been sent to her pharmacy [facility]. And i also told her that she will need a liver panel check in 6 weeks, the patient said she would set up her own lab appointment.

## 2013-01-07 ENCOUNTER — Encounter: Payer: Self-pay | Admitting: Internal Medicine

## 2013-01-19 ENCOUNTER — Ambulatory Visit: Payer: Self-pay | Admitting: Surgery

## 2013-02-01 ENCOUNTER — Other Ambulatory Visit: Payer: Self-pay | Admitting: Internal Medicine

## 2013-02-01 ENCOUNTER — Other Ambulatory Visit: Payer: Self-pay | Admitting: *Deleted

## 2013-02-01 MED ORDER — PANTOPRAZOLE SODIUM 40 MG PO TBEC: 40.0000 mg | DELAYED_RELEASE_TABLET | Freq: Two times a day (BID) | ORAL | Status: DC

## 2013-02-01 MED ORDER — CLONAZEPAM 0.5 MG PO TABS: 0.5000 mg | ORAL_TABLET | Freq: Two times a day (BID) | ORAL | Status: DC | PRN

## 2013-02-01 NOTE — Telephone Encounter (Signed)
Ok to refill 

## 2013-02-01 NOTE — Telephone Encounter (Signed)
Refilled clonazepam .5mg #30 with one refill 

## 2013-02-01 NOTE — Telephone Encounter (Signed)
Rx faxed to pharmacy  

## 2013-02-09 ENCOUNTER — Ambulatory Visit: Payer: Self-pay | Admitting: Surgery

## 2013-02-26 ENCOUNTER — Ambulatory Visit: Payer: 59 | Admitting: Internal Medicine

## 2013-04-16 ENCOUNTER — Ambulatory Visit (INDEPENDENT_AMBULATORY_CARE_PROVIDER_SITE_OTHER): Payer: 59 | Admitting: Internal Medicine

## 2013-04-16 ENCOUNTER — Other Ambulatory Visit: Payer: Self-pay | Admitting: Internal Medicine

## 2013-04-16 ENCOUNTER — Encounter: Payer: Self-pay | Admitting: Internal Medicine

## 2013-04-16 VITALS — BP 130/80 | HR 59 | Temp 98.8°F | Ht 69.0 in | Wt 255.2 lb

## 2013-04-16 DIAGNOSIS — K589 Irritable bowel syndrome without diarrhea: Secondary | ICD-10-CM

## 2013-04-16 DIAGNOSIS — I10 Essential (primary) hypertension: Secondary | ICD-10-CM

## 2013-04-16 DIAGNOSIS — R000 Tachycardia, unspecified: Secondary | ICD-10-CM

## 2013-04-16 DIAGNOSIS — K219 Gastro-esophageal reflux disease without esophagitis: Secondary | ICD-10-CM

## 2013-04-16 DIAGNOSIS — E119 Type 2 diabetes mellitus without complications: Secondary | ICD-10-CM

## 2013-04-16 DIAGNOSIS — D649 Anemia, unspecified: Secondary | ICD-10-CM

## 2013-04-16 DIAGNOSIS — E7800 Pure hypercholesterolemia, unspecified: Secondary | ICD-10-CM

## 2013-04-16 DIAGNOSIS — R197 Diarrhea, unspecified: Secondary | ICD-10-CM

## 2013-04-18 ENCOUNTER — Encounter: Payer: Self-pay | Admitting: Internal Medicine

## 2013-04-18 DIAGNOSIS — R197 Diarrhea, unspecified: Secondary | ICD-10-CM | POA: Insufficient documentation

## 2013-04-18 NOTE — Assessment & Plan Note (Signed)
Recheck cbc and iron studies to confirm stable.

## 2013-04-18 NOTE — Assessment & Plan Note (Signed)
Low carb diet.  Weight loss.  A1c 07/07/12 - 5.5.  Follow.

## 2013-04-18 NOTE — Assessment & Plan Note (Signed)
Blood pressure doing well now.  Continue to monitor.  Same medication regimen.

## 2013-04-18 NOTE — Assessment & Plan Note (Signed)
Seeing Dr Juliann Pares.  Continue metoprolol.  Stable.

## 2013-04-18 NOTE — Assessment & Plan Note (Signed)
S/p surgery.  Acid reflux and swallowing better.  Continue protonix.   

## 2013-04-18 NOTE — Assessment & Plan Note (Signed)
Persistent.  Check stool for C. Diff, O&P, routine culture and wbc's.  Also check routine labs including liver panel, cbc and metabolic panel.  Refer back to GI for evaluation.

## 2013-04-18 NOTE — Progress Notes (Signed)
Subjective:    Patient ID: Jacqueline Nichols, female    DOB: 1956/07/03, 57 y.o.   MRN: [id number]  HPI 57 year old female with past history of brainstem CVA, hypertension, "borderline diabetes", GERD and hypercholesterolemia who comes in today for a scheduled follow up.  Has had a lot of issues with GERD and swallowing.  See previous notes for details.  Was referred back to GI.  They referred her to Dr Katrinka Blazing.  Dr Katrinka Blazing ordered an UGI which revealed a small hiatal hernia and no acid reflux.  Gastric emptying was normal.  He recommended a lap hernia repair with fundoplication.  She is s/p surgery - 6/14.  She states she has had increased pain and nausea.  This has improved.  Still will occasionally feel things are getting stuck, but is better.  Her main complaint is that of diarrhea.  Persistent.  Described as watery diarrhea.  Multiple episodes per day.  Is eating and drinking.  No vomiting.  Taking immodium.  Dr Katrinka Blazing aware.  Gave her dicyclomine.  Helps some to "relax" her bowel.      Past Medical History  Diagnosis Date  . Cancer     skin  . Depression   . Diabetes mellitus   . GERD (gastroesophageal reflux disease)   . Hypertension   . Hyperlipidemia   . Chicken pox   . Colon polyps   . Stroke     brainstem  . Urinary incontinence     Current Outpatient Prescriptions on File Prior to Visit  Medication Sig Dispense Refill  . atorvastatin (LIPITOR) 20 MG tablet Take 1 tablet (20 mg total) by mouth daily.  30 tablet  2  . bisoprolol-hydrochlorothiazide (ZIAC) 2.5-6.25 MG per tablet Take 1 tablet by mouth daily.      . citalopram (CELEXA) 40 MG tablet Take 40 mg by mouth daily.      . clonazePAM (KLONOPIN) 0.5 MG tablet Take 1 tablet (0.5 mg total) by mouth 2 (two) times daily as needed.  30 tablet  1  . clopidogrel (PLAVIX) 75 MG tablet TAKE ONE (1) TABLET EACH DAY  90 tablet  1  . fluticasone (VERAMYST) 27.5 MCG/SPRAY nasal spray Place 2 sprays into the nose as needed.      . metoprolol  succinate (TOPROL-XL) 25 MG 24 hr tablet Take 25 mg by mouth 2 (two) times daily.      . Misc Natural Products (ALLERGY RELEAF SYSTEM PO) Take by mouth. 24 hour relief      . pantoprazole (PROTONIX) 40 MG tablet Take 1 tablet (40 mg total) by mouth 2 (two) times daily.  60 tablet  5  . torsemide (DEMADEX) 20 MG tablet TAKE ONE (1) TABLET EACH DAY AS NEEDED  90 tablet  1   No current facility-administered medications on file prior to visit.    Review of Systems Patient denies any headache, lightheadedness or dizziness. No sinus or allergy symptoms.  No chest pain, tightness or palpitations.  No increased shortness of breath, cough or congestion.  Reflux has improved.  Swallowing has improved.  See above.  No significant abdominal pain or cramping.  Does report increased persistent watery diarrhea.  No BRBPR or melana.  No urine change.        Objective:   Physical Exam  Filed Vitals:   04/16/13 1159  BP: 130/80  Pulse: 59  Temp: 98.8 F (12.33 C)   57 year old female in no acute distress.   HEENT:  Nares-  clear.  Oropharynx - without lesions. NECK:  Supple.  Nontender.  No audible bruit.  HEART:  Appears to be regular. LUNGS:  No crackles or wheezing audible.  Respirations even and unlabored.  RADIAL PULSE:  Equal bilaterally.  ABDOMEN:  Soft, nontender.  Bowel sounds present and normal.  No audible abdominal bruit.    EXTREMITIES:  No increased edema present.  DP pulses palpable and equal bilaterally.            Assessment & Plan:  GYN.  Postmenopausal.  No vaginal bleeding or spotting.  Pap last visit.  Cervical polyp present.  Referred to gyn for removal.     ORTHO.  Knee pain as outlined in previous note.  Sees Dr Ernest Pine.  Is s/p eight knee surgeries.  Appears to be off mobic.  Tylenol prn as directed.      HEALTH MAINTENANCE.  Physical last visit.  Mammogram 07/14/12 - BiRADS II.  States up to date with her colonoscopy.

## 2013-04-18 NOTE — Assessment & Plan Note (Signed)
Diarrhea as outlined.  W/up planned as outlined.  Refer back to GI.

## 2013-04-18 NOTE — Assessment & Plan Note (Signed)
Low cholesterol diet.  Back on cholesterol medication now.  Follow lipid profile and liver function.

## 2013-04-19 LAB — BASIC METABOLIC PANEL
BUN: 11 mg/dL (ref 6–24)
Calcium: 9.5 mg/dL (ref 8.7–10.2)
Chloride: 101 mmol/L (ref 97–108)
GFR calc non Af Amer: 91 mL/min/{1.73_m2} (ref >59)
Glucose: 89 mg/dL (ref 65–99)
Potassium: 4.3 mmol/L (ref 3.5–5.2)

## 2013-04-19 LAB — CBC WITH DIFFERENTIAL
Basos: 0 % (ref 0–3)
Eosinophils Absolute: 0.2 10*3/uL (ref 0.0–0.4)
Hemoglobin: 13.1 g/dL (ref 11.1–15.9)
Immature Grans (Abs): 0 10*3/uL (ref 0.0–0.1)
Lymphs: 38 % (ref 14–46)
MCHC: 34.5 g/dL (ref 31.5–35.7)
Monocytes: 7 % (ref 4–12)
Neutrophils Relative %: 52 % (ref 40–74)
RBC: 4.75 x10E6/uL (ref 3.77–5.28)
WBC: 6.4 10*3/uL (ref 3.4–10.8)

## 2013-04-19 LAB — HEPATIC FUNCTION PANEL
ALT: 16 IU/L (ref 0–32)
Alkaline Phosphatase: 133 IU/L — ABNORMAL HIGH (ref 39–117)
Bilirubin, Direct: 0.13 mg/dL (ref 0.00–0.40)

## 2013-04-19 LAB — TSH: TSH: 1.16 u[IU]/mL (ref 0.450–4.500)

## 2013-04-20 ENCOUNTER — Encounter: Payer: Self-pay | Admitting: *Deleted

## 2013-04-26 ENCOUNTER — Telehealth: Payer: Self-pay | Admitting: Internal Medicine

## 2013-04-26 NOTE — Telephone Encounter (Signed)
She needs eval if weak and going 17 times.  Probably needs fluids.  Needs to let Dr Katrinka Blazing know - since occurred s/p her surgery.  I have not seen her lab results.

## 2013-04-26 NOTE — Telephone Encounter (Signed)
Pt notified of lab results

## 2013-04-26 NOTE — Telephone Encounter (Signed)
Spoke with pt & she stated that she has been to the restroom 17 times since yesterday & really needs some relief. So feels weak. Pt states that she is staying hydrated. She also was hoping to get the results from her stool test done through [facility] (I have not seen them & told her I would check with you before requesting them)

## 2013-04-26 NOTE — Telephone Encounter (Signed)
The patient is wanting a call from Dr. Lorin Picket.

## 2013-04-26 NOTE — Telephone Encounter (Signed)
Also aware that we are waiting on the final results from the C. Diff test. Pt also informed of Dr. Marina Goodell recommendations

## 2013-04-28 ENCOUNTER — Telehealth: Payer: Self-pay | Admitting: *Deleted

## 2013-04-28 NOTE — Telephone Encounter (Signed)
Pt informed that C-diff was not ran & that she needs to have it done. She would like to have ot done through [facility] again. New order on your desk

## 2013-04-28 NOTE — Telephone Encounter (Signed)
See lab note.  They ran all other stool tests and these were negative.  C. Diff not run.  She does need to have this test done.  Does she want to get here?  Let me know and I will place order.

## 2013-04-28 NOTE — Telephone Encounter (Signed)
Final report received and placed on your desk

## 2013-04-28 NOTE — Telephone Encounter (Signed)
Order signed and placed in your box.   

## 2013-04-29 LAB — OVA AND PARASITE EXAMINATION

## 2013-04-29 LAB — FECAL LEUKOCYTES

## 2013-04-29 LAB — CLOSTRIDIUM DIFFICILE BY PCR

## 2013-04-29 NOTE — Telephone Encounter (Signed)
Faxed to [address]

## 2013-04-30 NOTE — Telephone Encounter (Signed)
Error, note already started

## 2013-05-05 ENCOUNTER — Encounter: Payer: Self-pay | Admitting: *Deleted

## 2013-05-05 NOTE — Telephone Encounter (Signed)
Noted.  I called GI and they are gong to see her next Tuesday (I think pt is already aware).  Let me know if she needs anything more from me.

## 2013-05-05 NOTE — Telephone Encounter (Signed)
Pt asking for call from Dr. Lorin Picket.  States she received her results via email, negative.  Pt states something is wrong with her.  Pt states she cannot eat anything without having to run to the bathroom with diarrhea.

## 2013-05-05 NOTE — Telephone Encounter (Signed)
Gave pt number to try check & see if they have had any cancellations. If no luck, pt advised to call us back

## 2013-05-05 NOTE — Telephone Encounter (Signed)
appt is not until 9/15 @ 9am with GI-pt will try calling today to move appt up, if not luck she will give [place] another call to see if we can move it up

## 2013-05-05 NOTE — Telephone Encounter (Signed)
See below

## 2013-05-05 NOTE — Telephone Encounter (Signed)
Please notify pt that I am not in the office this pm.  I do not mind calling her.  Need to know if she has seen GI yet.  We referred her for persistent diarrhea.  Thanks.  Would like to know this before calling her.

## 2013-05-14 ENCOUNTER — Ambulatory Visit: Payer: Self-pay | Admitting: Gastroenterology

## 2013-05-17 LAB — PATHOLOGY REPORT

## 2013-05-19 ENCOUNTER — Encounter: Payer: Self-pay | Admitting: Internal Medicine

## 2013-05-21 ENCOUNTER — Encounter: Payer: Self-pay | Admitting: Internal Medicine

## 2013-05-21 DIAGNOSIS — K589 Irritable bowel syndrome without diarrhea: Secondary | ICD-10-CM

## 2013-05-31 ENCOUNTER — Telehealth: Payer: Self-pay | Admitting: *Deleted

## 2013-05-31 MED ORDER — CITALOPRAM HYDROBROMIDE 40 MG PO TABS: 40.0000 mg | ORAL_TABLET | Freq: Every day | ORAL | Status: DC

## 2013-05-31 MED ORDER — BISOPROLOL-HYDROCHLOROTHIAZIDE 2.5-6.25 MG PO TABS: 1.0000 | ORAL_TABLET | Freq: Every day | ORAL | Status: DC

## 2013-05-31 NOTE — Telephone Encounter (Signed)
I am confused.  Need to clarify with pharmacy.  This is first message I can find for refill.  Also, it appears that some of these (i.e. Plavix) that she has refills.  I do not prescribe the meloxicam.  She is on plavix and has a lot of GI issues and I do not recommend her taking the mobic.  Would use tylenol instead.  If needs refills I am ok with refilling the other three medications x 4 months.

## 2013-05-31 NOTE — Telephone Encounter (Signed)
Pharmacist stated they have faxed the office 3 times with no response for refill on the following medications.   Plavix  Meloxicam   Citalopram  Bisoprolol-HCTZ

## 2013-05-31 NOTE — Telephone Encounter (Signed)
Spoke with pharmacist, pt picked up Plavix on 9/25 #90. Sent in a refill on the Citalopram & Bisoprolol. Did not send in Meloxicam (see below for reason)

## 2013-06-01 ENCOUNTER — Encounter: Payer: Self-pay | Admitting: *Deleted

## 2013-06-03 ENCOUNTER — Encounter: Payer: Self-pay | Admitting: Internal Medicine

## 2013-06-03 DIAGNOSIS — K589 Irritable bowel syndrome without diarrhea: Secondary | ICD-10-CM

## 2013-06-04 ENCOUNTER — Encounter: Payer: Self-pay | Admitting: Internal Medicine

## 2013-07-08 ENCOUNTER — Other Ambulatory Visit: Payer: Self-pay

## 2013-07-21 ENCOUNTER — Ambulatory Visit: Payer: 59 | Admitting: Internal Medicine

## 2013-08-20 ENCOUNTER — Ambulatory Visit (INDEPENDENT_AMBULATORY_CARE_PROVIDER_SITE_OTHER): Payer: 59 | Admitting: Internal Medicine

## 2013-08-20 ENCOUNTER — Encounter: Payer: Self-pay | Admitting: Internal Medicine

## 2013-08-20 VITALS — BP 110/70 | HR 46 | Temp 98.6°F | Ht 69.0 in | Wt 236.0 lb

## 2013-08-20 DIAGNOSIS — I498 Other specified cardiac arrhythmias: Secondary | ICD-10-CM

## 2013-08-20 DIAGNOSIS — E119 Type 2 diabetes mellitus without complications: Secondary | ICD-10-CM

## 2013-08-20 DIAGNOSIS — K219 Gastro-esophageal reflux disease without esophagitis: Secondary | ICD-10-CM

## 2013-08-20 DIAGNOSIS — I10 Essential (primary) hypertension: Secondary | ICD-10-CM

## 2013-08-20 DIAGNOSIS — R001 Bradycardia, unspecified: Secondary | ICD-10-CM

## 2013-08-20 DIAGNOSIS — R197 Diarrhea, unspecified: Secondary | ICD-10-CM

## 2013-08-20 DIAGNOSIS — D649 Anemia, unspecified: Secondary | ICD-10-CM

## 2013-08-20 DIAGNOSIS — E7800 Pure hypercholesterolemia, unspecified: Secondary | ICD-10-CM

## 2013-08-20 DIAGNOSIS — K589 Irritable bowel syndrome without diarrhea: Secondary | ICD-10-CM

## 2013-08-20 NOTE — Patient Instructions (Signed)
Decrease your Toprol to 1/2 tablet per day

## 2013-08-20 NOTE — Progress Notes (Signed)
Pre-visit discussion using our clinic review tool. No additional management support is needed unless otherwise documented below in the visit note.  

## 2013-08-20 NOTE — Progress Notes (Signed)
Subjective:    Patient ID: Jacqueline Nichols, female    DOB: [birth year], 56 y.o.   MRN: [id number]  HPI 57 year old female with past history of brainstem CVA, hypertension, "borderline diabetes", GERD and hypercholesterolemia who comes in today for a scheduled follow up.  Has had a lot of issues with GERD and swallowing.  See previous notes for details.  Was referred back to GI.  They referred her to Dr Katrinka Blazing.  Dr Katrinka Blazing ordered an UGI which revealed a small hiatal hernia and no acid reflux.  Gastric emptying was normal.  He recommended a lap hernia repair with fundoplication.  She is s/p surgery - 6/14.  Her main complaint now is that of diarrhea.  Persistent.  Described as watery diarrhea.  Is better.  Has seen GI.  Taking immodium and hyoscyamine.   Is eating and drinking.  No vomiting.  Pulse rate low.  Discussed with her today.  Some fatigue.  No light headedness.  No dizziness.  No syncope or near syncopal episodes.       Past Medical History  Diagnosis Date  . Cancer     skin  . Depression   . Diabetes mellitus   . GERD (gastroesophageal reflux disease)   . Hypertension   . Hyperlipidemia   . Chicken pox   . Colon polyps   . Stroke     brainstem  . Urinary incontinence     Current Outpatient Prescriptions on File Prior to Visit  Medication Sig Dispense Refill  . atorvastatin (LIPITOR) 20 MG tablet Take 1 tablet (20 mg total) by mouth daily.  30 tablet  2  . bisoprolol-hydrochlorothiazide (ZIAC) 2.5-6.25 MG per tablet Take 1 tablet by mouth daily.  90 tablet  0  . citalopram (CELEXA) 40 MG tablet Take 1 tablet (40 mg total) by mouth daily.  90 tablet  0  . clonazePAM (KLONOPIN) 0.5 MG tablet Take 1 tablet (0.5 mg total) by mouth 2 (two) times daily as needed.  30 tablet  1  . clopidogrel (PLAVIX) 75 MG tablet TAKE ONE (1) TABLET EACH DAY  90 tablet  1  . fluticasone (VERAMYST) 27.5 MCG/SPRAY nasal spray Place 2 sprays into the nose as needed.      . metoprolol succinate (TOPROL-XL) 25  MG 24 hr tablet Take 25 mg by mouth 2 (two) times daily.      . Misc Natural Products (ALLERGY RELEAF SYSTEM PO) Take by mouth. 24 hour relief      . torsemide (DEMADEX) 20 MG tablet TAKE ONE (1) TABLET EACH DAY AS NEEDED  90 tablet  1   No current facility-administered medications on file prior to visit.    Review of Systems Patient denies any headache, lightheadedness or dizziness. No sinus or allergy symptoms.  No chest pain, tightness or palpitations.  No increased shortness of breath, cough or congestion.  Reflux has improved.  Swallowing has improved  No significant abdominal pain or cramping.  Does report increased persistent watery diarrhea.  No BRBPR or melana.  No urine change.  Some fatigue.       Objective:   Physical Exam  Filed Vitals:   08/20/13 1436  BP: 110/70  Pulse: 46  Temp: 98.6 F (37 C)   Pulse recheck;  47  57 year old female in no acute distress.   HEENT:  Nares- clear.  Oropharynx - without lesions. NECK:  Supple.  Nontender.  No audible bruit.  HEART:  Appears to be regular. LUNGS:  No crackles or wheezing audible.  Respirations even and unlabored.  RADIAL PULSE:  Equal bilaterally.  ABDOMEN:  Soft, nontender.  Bowel sounds present and normal.  No audible abdominal bruit.    EXTREMITIES:  No increased edema present.  DP pulses palpable and equal bilaterally.            Assessment & Plan:  GYN.  Postmenopausal.  No vaginal bleeding or spotting.  Was found to have a cervical polyp  On my physical exam.  Saw Dr Greggory Keen.  S/p polyp removal.  Benign.      ORTHO.  Sees Dr Ernest Pine.  Is s/p eight knee surgeries.  Appears to be off mobic.  Tylenol prn as directed.      HEALTH MAINTENANCE.  Physical 10/21/12.  Mammogram 07/14/12 - BiRADS II.  Due f/u mammogram.  Up to date with her colonoscopy.

## 2013-08-22 ENCOUNTER — Encounter: Payer: Self-pay | Admitting: Internal Medicine

## 2013-08-22 DIAGNOSIS — R001 Bradycardia, unspecified: Secondary | ICD-10-CM | POA: Insufficient documentation

## 2013-08-22 NOTE — Assessment & Plan Note (Signed)
Low cholesterol diet and exercise.   Follow lipid profile and liver function.   

## 2013-08-22 NOTE — Assessment & Plan Note (Addendum)
Blood pressure doing well now.  Continue to monitor.  Adjust the toprol as outlined.

## 2013-08-22 NOTE — Assessment & Plan Note (Signed)
Noted on exam.  Will decrease the toprol to 25mg  q day.  Follow.  Follow blood pressure and pulse.  Get her back in soon to reassess.

## 2013-08-22 NOTE — Assessment & Plan Note (Signed)
Persistent.  Better.  Seeing GI.  Taking immodium and hyoscyamine.  Follow.     

## 2013-08-22 NOTE — Assessment & Plan Note (Signed)
Recheck cbc and iron studies to confirm stable.

## 2013-08-22 NOTE — Assessment & Plan Note (Signed)
Low carb diet.  Weight loss.  Follow met b and a1c.    

## 2013-08-22 NOTE — Assessment & Plan Note (Signed)
S/p surgery.  Acid reflux and swallowing better.  Continue protonix.   

## 2013-08-22 NOTE — Assessment & Plan Note (Signed)
Diarrhea as outlined.  Has been worked up by GI.  Taking immodium and hyoscyamine.  Some better.  Follow.    

## 2013-09-21 ENCOUNTER — Other Ambulatory Visit: Payer: Self-pay | Admitting: Internal Medicine

## 2013-10-12 ENCOUNTER — Encounter: Payer: Self-pay | Admitting: Internal Medicine

## 2013-10-12 ENCOUNTER — Ambulatory Visit (INDEPENDENT_AMBULATORY_CARE_PROVIDER_SITE_OTHER): Payer: 59 | Admitting: Internal Medicine

## 2013-10-12 VITALS — BP 110/80 | HR 67 | Temp 98.1°F | Ht 69.0 in | Wt 232.0 lb

## 2013-10-12 DIAGNOSIS — Z1239 Encounter for other screening for malignant neoplasm of breast: Secondary | ICD-10-CM

## 2013-10-12 DIAGNOSIS — K219 Gastro-esophageal reflux disease without esophagitis: Secondary | ICD-10-CM

## 2013-10-12 DIAGNOSIS — R001 Bradycardia, unspecified: Secondary | ICD-10-CM

## 2013-10-12 DIAGNOSIS — I498 Other specified cardiac arrhythmias: Secondary | ICD-10-CM

## 2013-10-12 DIAGNOSIS — R000 Tachycardia, unspecified: Secondary | ICD-10-CM

## 2013-10-12 DIAGNOSIS — K589 Irritable bowel syndrome without diarrhea: Secondary | ICD-10-CM

## 2013-10-12 DIAGNOSIS — D649 Anemia, unspecified: Secondary | ICD-10-CM

## 2013-10-12 DIAGNOSIS — R197 Diarrhea, unspecified: Secondary | ICD-10-CM

## 2013-10-12 DIAGNOSIS — E119 Type 2 diabetes mellitus without complications: Secondary | ICD-10-CM

## 2013-10-12 DIAGNOSIS — I10 Essential (primary) hypertension: Secondary | ICD-10-CM

## 2013-10-12 DIAGNOSIS — E7800 Pure hypercholesterolemia, unspecified: Secondary | ICD-10-CM

## 2013-10-12 DIAGNOSIS — R748 Abnormal levels of other serum enzymes: Secondary | ICD-10-CM

## 2013-10-12 NOTE — Progress Notes (Signed)
Subjective:    Patient ID: Jacqueline Nichols, female    DOB: Apr 18, 1956, 57 y.o.   MRN: [id number]  HPI 58 year old female with past history of brainstem CVA, hypertension, "borderline diabetes", GERD and hypercholesterolemia who comes in today for a scheduled follow up.  Has had a lot of issues with GERD and swallowing. See previous notes for details.  Was referred back to GI.  They referred her to Dr Tamala Julian.  Dr Tamala Julian ordered an UGI which revealed a small hiatal hernia and no acid reflux.  Gastric emptying was normal.  He recommended a lap hernia repair with fundoplication.  She is s/p surgery - 6/14.  Her main complaint now is that of diarrhea.  Persistent.  Is some better.   Has seen GI.  Taking immodium and hyoscyamine.   Is eating and drinking.  No vomiting.  Pulse rate was low.  We adjusted her beta blocker last visit.  Pulse rate better.  No light headedness.  No dizziness.  No syncope or near syncopal episodes.  Feels she is handling stress relatively well.     Past Medical History  Diagnosis Date  . Cancer     skin  . Depression   . Diabetes mellitus   . GERD (gastroesophageal reflux disease)   . Hypertension   . Hyperlipidemia   . Chicken pox   . Colon polyps   . Stroke     brainstem  . Urinary incontinence     Current Outpatient Prescriptions on File Prior to Visit  Medication Sig Dispense Refill  . atorvastatin (LIPITOR) 20 MG tablet Take 1 tablet (20 mg total) by mouth daily.  30 tablet  2  . bisoprolol-hydrochlorothiazide (ZIAC) 2.5-6.25 MG per tablet Take 1 tablet by mouth daily.  90 tablet  0  . citalopram (CELEXA) 40 MG tablet Take 1 tablet (40 mg total) by mouth daily.  90 tablet  0  . clonazePAM (KLONOPIN) 0.5 MG tablet Take 1 tablet (0.5 mg total) by mouth 2 (two) times daily as needed.  30 tablet  1  . clopidogrel (PLAVIX) 75 MG tablet TAKE ONE (1) TABLET EACH DAY  90 tablet  1  . fluticasone (VERAMYST) 27.5 MCG/SPRAY nasal spray Place 2 sprays into the nose as needed.       . hyoscyamine (ANASPAZ) 0.125 MG TBDP disintergrating tablet Place 0.125 mg under the tongue every 4 (four) hours as needed.      . Loperamide HCl (IMODIUM PO) Take by mouth as needed.      . metoprolol succinate (TOPROL-XL) 25 MG 24 hr tablet Take 25 mg by mouth daily.      . Misc Natural Products (ALLERGY RELEAF SYSTEM PO) Take by mouth. 24 hour relief      . torsemide (DEMADEX) 20 MG tablet TAKE ONE (1) TABLET EACH DAY AS NEEDED  90 tablet  1   No current facility-administered medications on file prior to visit.    Review of Systems Patient denies any headache, lightheadedness or dizziness. No sinus or allergy symptoms.  No chest pain, tightness or palpitations.  No increased shortness of breath, cough or congestion.  Reflux has improved.  Swallowing has improved  No significant abdominal pain or cramping.  Diarrhea better.  Still taking immodium.  No BRBPR or melana.  No urine change.        Objective:   Physical Exam  Filed Vitals:   10/12/13 1504  BP: 110/80  Pulse: 67  Temp: 98.1 F (36.7 C)  Blood pressure recheck:  14/82  58 year old female in no acute distress.   HEENT:  Nares- clear.  Oropharynx - without lesions. NECK:  Supple.  Nontender.  No audible bruit.  HEART:  Appears to be regular. LUNGS:  No crackles or wheezing audible.  Respirations even and unlabored.  RADIAL PULSE:  Equal bilaterally.  ABDOMEN:  Soft, nontender.  Bowel sounds present and normal.  No audible abdominal bruit.    EXTREMITIES:  No increased edema present.  DP pulses palpable and equal bilaterally.            Assessment & Plan:  GYN.  Postmenopausal.  No vaginal bleeding or spotting.  Was found to have a cervical polyp  On my physical exam.  Saw Dr Enzo Bi.  S/p polyp removal.  Benign.      ORTHO.  Sees Dr Marry Guan.  Is s/p eight knee surgeries.  Appears to be off mobic.  Tylenol prn as directed.      HEALTH MAINTENANCE.  Physical 10/21/12.  Mammogram 07/14/12 - BiRADS II.  Due f/u  mammogram.  Up to date with her colonoscopy.   Schedule f/u mammogram.

## 2013-10-12 NOTE — Assessment & Plan Note (Addendum)
Low carb diet.  Weight loss.  Follow met b and a1c.  A1c 10/12/13 - 5.6.

## 2013-10-13 ENCOUNTER — Encounter: Payer: Self-pay | Admitting: Emergency Medicine

## 2013-10-17 ENCOUNTER — Encounter: Payer: Self-pay | Admitting: Internal Medicine

## 2013-10-17 DIAGNOSIS — R748 Abnormal levels of other serum enzymes: Secondary | ICD-10-CM | POA: Insufficient documentation

## 2013-10-17 NOTE — Assessment & Plan Note (Signed)
Persistent.  Better.  Seeing GI.  Taking immodium and hyoscyamine.  Follow.

## 2013-10-17 NOTE — Assessment & Plan Note (Signed)
Blood pressure doing well now.  Continue to monitor.    

## 2013-10-17 NOTE — Assessment & Plan Note (Signed)
Hgb just checked - 12.9.  Follow.

## 2013-10-17 NOTE — Assessment & Plan Note (Signed)
Diarrhea as outlined.  Has been worked up by GI.  Taking immodium and hyoscyamine.  Some better.  Follow.

## 2013-10-17 NOTE — Assessment & Plan Note (Signed)
Alkaline phosphatase level elevated.  Recheck.  If persistent elevation, will need to fractionate and pursue further w/up.

## 2013-10-17 NOTE — Assessment & Plan Note (Signed)
Low cholesterol diet and exercise.   Follow lipid profile and liver function.

## 2013-10-17 NOTE — Assessment & Plan Note (Signed)
S/p surgery.  Acid reflux and swallowing better.  Continue protonix.   

## 2013-10-17 NOTE — Assessment & Plan Note (Signed)
On toprol 25mg  q day.  Pulse better.  Follow.  Follow blood pressure and pulse.

## 2013-10-17 NOTE — Assessment & Plan Note (Signed)
Seeing Dr Clayborn Bigness.  Continue metoprolol on the lower dose.  Follow.

## 2013-10-25 LAB — COMPREHENSIVE METABOLIC PANEL
ALBUMIN: 4.1 g/dL (ref 3.5–5.5)
ALK PHOS: 131 IU/L — AB (ref 39–117)
ALT: 12 IU/L (ref 0–32)
AST: 14 IU/L (ref 0–40)
Albumin/Globulin Ratio: 2 (ref 1.1–2.5)
BUN / CREAT RATIO: 15 (ref 9–23)
BUN: 12 mg/dL (ref 6–24)
CHLORIDE: 105 mmol/L (ref 97–108)
CO2: 21 mmol/L (ref 18–29)
Calcium: 9.7 mg/dL (ref 8.7–10.2)
Creatinine, Ser: 0.78 mg/dL (ref 0.57–1.00)
GFR calc Af Amer: 99 mL/min/{1.73_m2} (ref >59)
GFR calc non Af Amer: 86 mL/min/{1.73_m2} (ref >59)
Globulin, Total: 2.1 g/dL (ref 1.5–4.5)
Glucose: 101 mg/dL — ABNORMAL HIGH (ref 65–99)
Potassium: 4.2 mmol/L (ref 3.5–5.2)
SODIUM: 141 mmol/L (ref 134–144)
Total Bilirubin: 0.5 mg/dL (ref 0.0–1.2)
Total Protein: 6.2 g/dL (ref 6.0–8.5)

## 2013-10-25 LAB — CBC WITH DIFFERENTIAL
BASOS ABS: 0 10*3/uL (ref 0.0–0.2)
Basos: 0 %
Eos: 2 %
Eosinophils Absolute: 0.1 10*3/uL (ref 0.0–0.4)
HCT: 37.6 % (ref 34.0–46.6)
Hemoglobin: 12.9 g/dL (ref 11.1–15.9)
IMMATURE GRANULOCYTES: 0 %
Immature Grans (Abs): 0 10*3/uL (ref 0.0–0.1)
LYMPHS ABS: 1.4 10*3/uL (ref 0.7–3.1)
LYMPHS: 25 %
MCH: 27.7 pg (ref 26.6–33.0)
MCHC: 34.3 g/dL (ref 31.5–35.7)
MCV: 81 fL (ref 79–97)
Monocytes Absolute: 0.3 10*3/uL (ref 0.1–0.9)
Monocytes: 6 %
Neutrophils Absolute: 3.7 10*3/uL (ref 1.4–7.0)
Neutrophils Relative %: 67 %
Platelets: 216 10*3/uL (ref 150–379)
RBC: 4.66 x10E6/uL (ref 3.77–5.28)
RDW: 14 % (ref 12.3–15.4)
WBC: 5.5 10*3/uL (ref 3.4–10.8)

## 2013-10-25 LAB — HGB A1C W/O EAG: Hgb A1c MFr Bld: 5.6 % (ref 4.8–5.6)

## 2013-10-25 LAB — LIPID PANEL W/O CHOL/HDL RATIO
CHOLESTEROL TOTAL: 172 mg/dL (ref 100–199)
HDL: 39 mg/dL — ABNORMAL LOW (ref >39)
LDL Calculated: 99 mg/dL (ref 0–99)
TRIGLYCERIDES: 169 mg/dL — AB (ref 0–149)
VLDL Cholesterol Cal: 34 mg/dL (ref 5–40)

## 2013-10-25 LAB — TSH: TSH: 1.58 u[IU]/mL (ref 0.450–4.500)

## 2013-10-25 LAB — VITAMIN B12: VITAMIN B 12: 291 pg/mL (ref 211–946)

## 2013-11-01 ENCOUNTER — Telehealth: Payer: Self-pay | Admitting: Internal Medicine

## 2013-11-01 NOTE — Telephone Encounter (Signed)
Pt states she was told if she needed more Fioricet to let Dr. Nicki Reaper know.  Pt states she needs it.  Does not have any left.  States she has been taking tylenol all weekend and it has not helped.  [employer].

## 2013-11-01 NOTE — Telephone Encounter (Signed)
LMTCB asked pt to leave specific details on voicemail (see below)

## 2013-11-01 NOTE — Telephone Encounter (Signed)
Need more information.  How often does she take fioricet and what does she take it for.  Not listed on her medication list.   Thanks.  Who has previously been prescribing?

## 2013-11-02 ENCOUNTER — Telehealth: Payer: Self-pay | Admitting: *Deleted

## 2013-11-02 ENCOUNTER — Other Ambulatory Visit: Payer: Self-pay | Admitting: *Deleted

## 2013-11-02 NOTE — Telephone Encounter (Signed)
See previous message.  Need to know who has been prescribing and what takes it for and how long on.  Thanks.

## 2013-11-02 NOTE — Telephone Encounter (Signed)
Refill Request  APAP/Butalbital/Caff 325-50-40 mg tab  #30   As needed for headaches

## 2013-11-02 NOTE — Telephone Encounter (Signed)
Okay to fill? 

## 2013-11-03 ENCOUNTER — Encounter: Payer: Self-pay | Admitting: *Deleted

## 2013-11-03 ENCOUNTER — Ambulatory Visit: Payer: Self-pay | Admitting: Internal Medicine

## 2013-11-03 LAB — HM MAMMOGRAPHY: HM Mammogram: NEGATIVE

## 2013-11-03 NOTE — Telephone Encounter (Signed)
Sent mychart message with questions regarding medication

## 2013-11-05 ENCOUNTER — Other Ambulatory Visit: Payer: Self-pay | Admitting: *Deleted

## 2013-11-05 MED ORDER — BUTALBITAL-APAP-CAFFEINE 50-325-40 MG PO TABS: 1.0000 | ORAL_TABLET | Freq: Every day | ORAL | Status: DC | PRN

## 2013-11-05 NOTE — Telephone Encounter (Signed)
Rx faxed to [facility] & pt notified via mychart

## 2013-11-05 NOTE — Telephone Encounter (Signed)
Pt returned my call after receiving the mychart message. Left a voicemail stating that she talked to you about this before & doesn't see what the problem is. Her last 2 previous physicians had no problem refilling this for her. She states the she ONLY takes it for headache & she hasn't taken one in about a 1 1/2 years. Pt states that if there is going to be a problem refilling this, please let her know.

## 2013-11-05 NOTE — Telephone Encounter (Signed)
Ok to refill fioricet one q day prn #20 with no refills.

## 2013-11-06 ENCOUNTER — Encounter: Payer: Self-pay | Admitting: Internal Medicine

## 2013-11-08 ENCOUNTER — Other Ambulatory Visit: Payer: Self-pay | Admitting: Internal Medicine

## 2013-11-08 ENCOUNTER — Other Ambulatory Visit: Payer: Self-pay | Admitting: *Deleted

## 2013-11-08 MED ORDER — METOPROLOL SUCCINATE ER 25 MG PO TB24: 25.0000 mg | ORAL_TABLET | Freq: Every day | ORAL | Status: DC

## 2014-01-26 ENCOUNTER — Ambulatory Visit (INDEPENDENT_AMBULATORY_CARE_PROVIDER_SITE_OTHER): Payer: 59 | Admitting: Internal Medicine

## 2014-01-26 ENCOUNTER — Encounter: Payer: Self-pay | Admitting: Internal Medicine

## 2014-01-26 ENCOUNTER — Encounter: Payer: 59 | Admitting: Internal Medicine

## 2014-01-26 VITALS — BP 110/80 | HR 53 | Temp 98.6°F | Ht 69.5 in | Wt 233.2 lb

## 2014-01-26 DIAGNOSIS — R748 Abnormal levels of other serum enzymes: Secondary | ICD-10-CM

## 2014-01-26 DIAGNOSIS — K219 Gastro-esophageal reflux disease without esophagitis: Secondary | ICD-10-CM

## 2014-01-26 DIAGNOSIS — R001 Bradycardia, unspecified: Secondary | ICD-10-CM

## 2014-01-26 DIAGNOSIS — K589 Irritable bowel syndrome without diarrhea: Secondary | ICD-10-CM

## 2014-01-26 DIAGNOSIS — D649 Anemia, unspecified: Secondary | ICD-10-CM

## 2014-01-26 DIAGNOSIS — J3489 Other specified disorders of nose and nasal sinuses: Secondary | ICD-10-CM

## 2014-01-26 DIAGNOSIS — R197 Diarrhea, unspecified: Secondary | ICD-10-CM

## 2014-01-26 DIAGNOSIS — I10 Essential (primary) hypertension: Secondary | ICD-10-CM

## 2014-01-26 DIAGNOSIS — I498 Other specified cardiac arrhythmias: Secondary | ICD-10-CM

## 2014-01-26 DIAGNOSIS — E7800 Pure hypercholesterolemia, unspecified: Secondary | ICD-10-CM

## 2014-01-26 DIAGNOSIS — R000 Tachycardia, unspecified: Secondary | ICD-10-CM

## 2014-01-26 DIAGNOSIS — E119 Type 2 diabetes mellitus without complications: Secondary | ICD-10-CM

## 2014-01-26 NOTE — Assessment & Plan Note (Addendum)
Has seen Dr Clayborn Bigness.  Recent episodes of increased heart rate.  Have been intermittent as outlined.  Unable to increase the metoprolol secondary to bradycardia.  Will refer back to cardiology for further evaluation and treatment recommendations.  Of note, EKG today revealed SR/SB with ventricular rate of 50.  No acute ischemic changes.  Follow.

## 2014-01-26 NOTE — Progress Notes (Signed)
Pre visit review using our clinic review tool, if applicable. No additional management support is needed unless otherwise documented below in the visit note. 

## 2014-01-30 ENCOUNTER — Encounter: Payer: Self-pay | Admitting: Internal Medicine

## 2014-01-30 DIAGNOSIS — J3489 Other specified disorders of nose and nasal sinuses: Secondary | ICD-10-CM | POA: Insufficient documentation

## 2014-01-30 NOTE — Assessment & Plan Note (Signed)
Hgb last checked - wnl.  Follow.    

## 2014-01-30 NOTE — Assessment & Plan Note (Signed)
On toprol 25mg  q day.  Pulse had been better.  Recent episodes of increased heart rate as outlined.  Unable to increase metoprolol secondary to bradycardia.  Refer to cardiology as outlined.

## 2014-01-30 NOTE — Assessment & Plan Note (Signed)
Persistent.  Has not been using the bactroban inside the nose.  Will start.  Call with update.  If persistent, will refer to ENT.

## 2014-01-30 NOTE — Progress Notes (Signed)
Subjective:    Patient ID: Jacqueline Nichols, female    DOB: [birth year], 58 y.o.   MRN: [id number]  HPI 58 year old female with past history of brainstem CVA, hypertension, "borderline diabetes", GERD and hypercholesterolemia who comes in today to follow up on these issues as well as for a complete physical exam.   Has had a lot of issues with GERD and swallowing. See previous notes for details.  Was referred back to GI.  They referred her to Dr Tamala Julian.  Dr Tamala Julian ordered an UGI which revealed a small hiatal hernia and no acid reflux.  Gastric emptying was normal.  He recommended a lap hernia repair with fundoplication.  She is s/p surgery - 6/14.  Her main complaint now is that of diarrhea.  Persistent.  Is some better.   Has seen GI.  Taking immodium and hyoscyamine.   Is eating and drinking.  No vomiting.  Pulse rate was low.  We adjusted her beta blocker previously.  Pulse was doing better (as far as not being too low).  She reports that two weeks ago, she developed increased heart rate.  When this occurred, she had some jaw discomfort.  Lasted approximately 6 hours.  Was not evaluated.  States she has noticed some intermittent episodes.  May have a few episodes per week.  These usually only last for 5-10 minutes.  No other chest pain or tightness.  No light headedness.  No dizziness.  No syncope or near syncopal episodes.  Feels she is handling stress relatively well.  She does still have a persistent nasal lesion.      Past Medical History  Diagnosis Date  . Cancer     skin  . Depression   . Diabetes mellitus   . GERD (gastroesophageal reflux disease)   . Hypertension   . Hyperlipidemia   . Chicken pox   . Colon polyps   . Stroke     brainstem  . Urinary incontinence     Current Outpatient Prescriptions on File Prior to Visit  Medication Sig Dispense Refill  . atorvastatin (LIPITOR) 20 MG tablet TAKE ONE (1) TABLET EACH DAY  30 tablet  5  . bisoprolol-hydrochlorothiazide (ZIAC) 2.5-6.25 MG  per tablet TAKE ONE (1) TABLET EACH DAY FOR BLOOD PRESSURE  90 tablet  1  . butalbital-acetaminophen-caffeine (FIORICET, ESGIC) 50-325-40 MG per tablet Take 1 tablet by mouth daily as needed for headache.  20 tablet  0  . citalopram (CELEXA) 40 MG tablet TAKE ONE (1) TABLET EACH DAY ( FOR DEPRESSION)  90 tablet  0  . clonazePAM (KLONOPIN) 0.5 MG tablet Take 1 tablet (0.5 mg total) by mouth 2 (two) times daily as needed.  30 tablet  1  . clopidogrel (PLAVIX) 75 MG tablet TAKE ONE (1) TABLET EACH DAY  90 tablet  1  . fluticasone (VERAMYST) 27.5 MCG/SPRAY nasal spray Place 2 sprays into the nose as needed.      . hyoscyamine (ANASPAZ) 0.125 MG TBDP disintergrating tablet Place 0.125 mg under the tongue every 4 (four) hours as needed.      . Loperamide HCl (IMODIUM PO) Take by mouth as needed.      . metoprolol succinate (TOPROL-XL) 25 MG 24 hr tablet Take 1 tablet (25 mg total) by mouth daily.  30 tablet  5  . Misc Natural Products (ALLERGY RELEAF SYSTEM PO) Take by mouth. 24 hour relief      . torsemide (DEMADEX) 20 MG tablet TAKE ONE (1) TABLET EACH  DAY AS NEEDED  90 tablet  1   No current facility-administered medications on file prior to visit.    Review of Systems Patient denies any headache, lightheadedness or dizziness. No sinus or allergy symptoms.  Nasal lesion as outlined.  No chest pain or tightness.  Does report the increased heart rate (intermittently) as outlined.  No increased shortness of breath, cough or congestion.  Reflux has improved.  Swallowing has improved  No significant abdominal pain or cramping. Diarrhea persistent, but some better.  Still taking immodium.  No BRBPR or melana.  No urine change.        Objective:   Physical Exam  Filed Vitals:   01/26/14 0930  BP: 110/80  Pulse: 53  Temp: 98.6 F (37 C)   Blood pressure recheck:  37/91  58 year old female in no acute distress.   HEENT:  Nares- clear.  Oropharynx - without lesions. NECK:  Supple.  Nontender.   No audible bruit.  HEART:  Appears to be regular. LUNGS:  No crackles or wheezing audible.  Respirations even and unlabored.  RADIAL PULSE:  Equal bilaterally.    BREASTS:  No nipple discharge or nipple retraction present.  Could not appreciate any distinct nodules or axillary adenopathy.  ABDOMEN:  Soft, nontender.  Bowel sounds present and normal.  No audible abdominal bruit.  GU:  Not performed.     EXTREMITIES:  No increased edema present.  DP pulses palpable and equal bilaterally.      FEET:  No lesions.         Assessment & Plan:  GYN.  Postmenopausal.  No vaginal bleeding or spotting.  Was found to have a cervical polyp  On my physical exam.  Saw Dr Enzo Bi.  S/p polyp removal.  Benign.  Had pap last year - negative.      ORTHO.  Sees Dr Marry Guan.  Is s/p eight knee surgeries.  Appears to be off mobic.  Tylenol prn as directed.      HEALTH MAINTENANCE.  Physical today.  Mammogram 11/03/13 - BiRADS I.  Up to date with her colonoscopy.     I spent 25 minutes with the patient and more than 50% of the time was spent in consultation regarding the above.

## 2014-01-30 NOTE — Assessment & Plan Note (Signed)
Blood pressure doing well now.  Continue to monitor.    

## 2014-01-30 NOTE — Assessment & Plan Note (Signed)
Diarrhea as outlined.  Has been worked up by GI.  Taking immodium and hyoscyamine.  Some better.  Follow.

## 2014-01-30 NOTE — Assessment & Plan Note (Signed)
Persistent.  Better.  Seeing GI.  Taking immodium and hyoscyamine.  Follow.

## 2014-01-30 NOTE — Assessment & Plan Note (Signed)
S/p surgery.  Acid reflux and swallowing better.  Continue protonix.   

## 2014-01-30 NOTE — Assessment & Plan Note (Signed)
Low carb diet.  Weight loss.  Follow met b and a1c.  A1c 10/12/13 - 5.6.

## 2014-01-30 NOTE — Assessment & Plan Note (Signed)
Low cholesterol diet and exercise.   Follow lipid profile and liver function.

## 2014-01-30 NOTE — Assessment & Plan Note (Signed)
Alkaline phosphatase level elevated.  Recheck.  If persistent elevation, will need to fractionate and pursue further w/up.

## 2014-02-16 ENCOUNTER — Other Ambulatory Visit: Payer: Self-pay | Admitting: Internal Medicine

## 2014-02-16 NOTE — Telephone Encounter (Signed)
Last OV 5.27.15, last refill 3.9.15.  Please advise refill in Dr Bary Leriche absence.

## 2014-03-20 ENCOUNTER — Telehealth: Payer: Self-pay | Admitting: Internal Medicine

## 2014-03-20 NOTE — Telephone Encounter (Signed)
Pt notified of lab results via mychart. 

## 2014-03-23 ENCOUNTER — Encounter: Payer: Self-pay | Admitting: Internal Medicine

## 2014-05-27 ENCOUNTER — Ambulatory Visit (INDEPENDENT_AMBULATORY_CARE_PROVIDER_SITE_OTHER): Payer: 59 | Admitting: Internal Medicine

## 2014-05-27 ENCOUNTER — Encounter: Payer: Self-pay | Admitting: Internal Medicine

## 2014-05-27 VITALS — BP 140/72 | HR 56 | Resp 14 | Ht 69.5 in | Wt 229.8 lb

## 2014-05-27 DIAGNOSIS — E7800 Pure hypercholesterolemia, unspecified: Secondary | ICD-10-CM

## 2014-05-27 DIAGNOSIS — R748 Abnormal levels of other serum enzymes: Secondary | ICD-10-CM

## 2014-05-27 DIAGNOSIS — K219 Gastro-esophageal reflux disease without esophagitis: Secondary | ICD-10-CM

## 2014-05-27 DIAGNOSIS — I10 Essential (primary) hypertension: Secondary | ICD-10-CM

## 2014-05-27 DIAGNOSIS — Z23 Encounter for immunization: Secondary | ICD-10-CM

## 2014-05-27 DIAGNOSIS — D649 Anemia, unspecified: Secondary | ICD-10-CM

## 2014-05-27 DIAGNOSIS — E119 Type 2 diabetes mellitus without complications: Secondary | ICD-10-CM

## 2014-05-27 DIAGNOSIS — I498 Other specified cardiac arrhythmias: Secondary | ICD-10-CM

## 2014-05-27 DIAGNOSIS — K589 Irritable bowel syndrome without diarrhea: Secondary | ICD-10-CM

## 2014-05-27 DIAGNOSIS — R197 Diarrhea, unspecified: Secondary | ICD-10-CM

## 2014-05-27 DIAGNOSIS — R001 Bradycardia, unspecified: Secondary | ICD-10-CM

## 2014-05-27 NOTE — Progress Notes (Signed)
Pre visit review using our clinic review tool, if applicable. No additional management support is needed unless otherwise documented below in the visit note. 

## 2014-05-29 ENCOUNTER — Encounter: Payer: Self-pay | Admitting: Internal Medicine

## 2014-05-29 NOTE — Assessment & Plan Note (Signed)
Hgb last checked - wnl.  Follow.

## 2014-05-29 NOTE — Assessment & Plan Note (Signed)
Blood pressure doing well now.  Continue to monitor.

## 2014-05-29 NOTE — Progress Notes (Signed)
Subjective:    Patient ID: Jacqueline Nichols, female    DOB: [birth year], 58 y.o.   MRN: [id number]  HPI 58 year old female with past history of brainstem CVA, hypertension, "borderline diabetes", GERD and hypercholesterolemia who comes in today for a scheduled follow up.    Has had a lot of issues with GERD and swallowing. See previous notes for details.  Was referred back to GI.  They referred her to Dr Tamala Julian.  Dr Tamala Julian ordered an UGI which revealed a small hiatal hernia and no acid reflux.  Gastric emptying was normal.  He recommended a lap hernia repair with fundoplication.  She is s/p surgery - 6/14.  She continued to have diarrhea.  Saw Dr Allen Norris.  On Welchol.  Bowels better.   Is eating and drinking.  No vomiting.  Pulse rate was low.  We adjusted her beta blocker previously.  Pulse doing better.  No light headedness.  No dizziness.  No syncope or near syncopal episodes.  Feels she is handling stress relatively well.   Cholesterol improved.  Will stop lipitor and see if she can maintain her cholesterol levels - just on welchol.     Past Medical History  Diagnosis Date  . Cancer     skin  . Depression   . Diabetes mellitus   . GERD (gastroesophageal reflux disease)   . Hypertension   . Hyperlipidemia   . Chicken pox   . Colon polyps   . Stroke     brainstem  . Urinary incontinence     Current Outpatient Prescriptions on File Prior to Visit  Medication Sig Dispense Refill  . atorvastatin (LIPITOR) 20 MG tablet TAKE ONE (1) TABLET EACH DAY  30 tablet  5  . bisoprolol-hydrochlorothiazide (ZIAC) 2.5-6.25 MG per tablet TAKE ONE (1) TABLET EACH DAY FOR BLOOD PRESSURE  90 tablet  1  . butalbital-acetaminophen-caffeine (FIORICET, ESGIC) 50-325-40 MG per tablet Take 1 tablet by mouth daily as needed for headache.  20 tablet  0  . citalopram (CELEXA) 40 MG tablet TAKE ONE (1) TABLET EACH DAY ( FOR DEPRESSION)  90 tablet  1  . clonazePAM (KLONOPIN) 0.5 MG tablet Take 1 tablet (0.5 mg total) by  mouth 2 (two) times daily as needed.  30 tablet  1  . clopidogrel (PLAVIX) 75 MG tablet TAKE ONE (1) TABLET EACH DAY  90 tablet  1  . fluticasone (VERAMYST) 27.5 MCG/SPRAY nasal spray Place 2 sprays into the nose as needed.      . hyoscyamine (ANASPAZ) 0.125 MG TBDP disintergrating tablet Place 0.125 mg under the tongue every 4 (four) hours as needed.      . Loperamide HCl (IMODIUM PO) Take by mouth as needed.      . metoprolol succinate (TOPROL-XL) 25 MG 24 hr tablet Take 1 tablet (25 mg total) by mouth daily.  30 tablet  5  . Misc Natural Products (ALLERGY RELEAF SYSTEM PO) Take by mouth. 24 hour relief      . torsemide (DEMADEX) 20 MG tablet TAKE ONE (1) TABLET EACH DAY AS NEEDED  90 tablet  1   No current facility-administered medications on file prior to visit.    Review of Systems Patient denies any headache, lightheadedness or dizziness. No sinus or allergy symptoms.   No chest pain or tightness.  No increased shortness of breath, cough or congestion.  Reflux has improved.  Swallowing has improved  No significant abdominal pain or cramping. Diarrhea better.  On welchol.  No BRBPR or melana.  No urine change.        Objective:   Physical Exam  Filed Vitals:   05/27/14 1427  BP: 140/72  Pulse: 56  Resp: 14   Blood pressure recheck:  74/73  58 year old female in no acute distress.   HEENT:  Nares- clear.  Oropharynx - without lesions. NECK:  Supple.  Nontender.  No audible bruit.  HEART:  Appears to be regular. LUNGS:  No crackles or wheezing audible.  Respirations even and unlabored.  RADIAL PULSE:  Equal bilaterally.  ABDOMEN:  Soft, nontender.  Bowel sounds present and normal.  No audible abdominal bruit.     EXTREMITIES:  No increased edema present.  DP pulses palpable and equal bilaterally.      FEET:  No lesions.         Assessment & Plan:  GYN.  Postmenopausal.  No vaginal bleeding or spotting.  Was found to have a cervical polyp  On my physical exam.  Saw Dr  Enzo Bi.  S/p polyp removal.  Benign.  Had pap last year - negative.      ORTHO.  Sees Dr Marry Guan.  Is s/p eight knee surgeries.  Appears to be off mobic.  Tylenol prn as directed.      HEALTH MAINTENANCE.  Physical 01/26/14.  Mammogram 11/03/13 - BiRADS I.  Up to date with her colonoscopy.

## 2014-05-29 NOTE — Assessment & Plan Note (Addendum)
Low cholesterol diet and exercise.   Follow lipid profile and liver function.  Recent cholesterol levels wnl.  Will stop lipitor.  Continue welchol.  See if can maintain cholesterol levels only on welchol.

## 2014-05-29 NOTE — Assessment & Plan Note (Signed)
Low carb diet.  Weight loss.  Follow met b and a1c.

## 2014-05-29 NOTE — Assessment & Plan Note (Signed)
Alkaline phosphatase level slightly elevated.  Follow.

## 2014-05-29 NOTE — Assessment & Plan Note (Signed)
Diarrhea improved.  On welchol.  Follow.  Seeing Dr Allen Norris.

## 2014-05-29 NOTE — Assessment & Plan Note (Signed)
On toprol 25mg  q day.  Pulse better.

## 2014-05-29 NOTE — Assessment & Plan Note (Signed)
S/p surgery.  Acid reflux and swallowing better.  Continue protonix.

## 2014-05-29 NOTE — Assessment & Plan Note (Signed)
Better on welchol.  Follow.

## 2014-06-17 ENCOUNTER — Other Ambulatory Visit: Payer: Self-pay

## 2014-07-01 ENCOUNTER — Other Ambulatory Visit: Payer: Self-pay | Admitting: Internal Medicine

## 2014-07-04 ENCOUNTER — Other Ambulatory Visit: Payer: Self-pay

## 2014-07-04 NOTE — Telephone Encounter (Signed)
The patient called and is hoping to get her Fioricet medication refilled.

## 2014-07-05 NOTE — Telephone Encounter (Signed)
Left message on identified VM for pt to return call.

## 2014-09-07 ENCOUNTER — Encounter: Payer: Self-pay | Admitting: Nurse Practitioner

## 2014-09-07 ENCOUNTER — Ambulatory Visit (INDEPENDENT_AMBULATORY_CARE_PROVIDER_SITE_OTHER): Payer: 59 | Admitting: Nurse Practitioner

## 2014-09-07 VITALS — BP 130/78 | HR 54 | Temp 98.1°F | Resp 14 | Wt 227.4 lb

## 2014-09-07 DIAGNOSIS — J0110 Acute frontal sinusitis, unspecified: Secondary | ICD-10-CM

## 2014-09-07 DIAGNOSIS — H6593 Unspecified nonsuppurative otitis media, bilateral: Secondary | ICD-10-CM

## 2014-09-07 MED ORDER — AMOXICILLIN-POT CLAVULANATE 875-125 MG PO TABS: 1.0000 | ORAL_TABLET | Freq: Two times a day (BID) | ORAL | Status: DC

## 2014-09-07 NOTE — Patient Instructions (Signed)
Please take all of prescribed antibiotic. Taking a probiotic with the antibiotic is helpful to keep "good bacteria" in your system. These are found in any drug store.   Call us if worsening or not improved in a week.   Sinusitis Sinusitis is redness, soreness, and puffiness (inflammation) of the air pockets in the bones of your face (sinuses). The redness, soreness, and puffiness can cause air and mucus to get trapped in your sinuses. This can allow germs to grow and cause an infection.  HOME CARE   Drink enough fluids to keep your pee (urine) clear or pale yellow.  Use a humidifier in your home.  Run a hot shower to create steam in the bathroom. Sit in the bathroom with the door closed. Breathe in the steam 3-4 times a day.  Put a warm, moist washcloth on your face 3-4 times a day, or as told by your doctor.  Use salt water sprays (saline sprays) to wet the thick fluid in your nose. This can help the sinuses drain.  Only take medicine as told by your doctor. GET HELP RIGHT AWAY IF:   Your pain gets worse.  You have very bad headaches.  You are sick to your stomach (nauseous).  You throw up (vomit).  You are very sleepy (drowsy) all the time.  Your face is puffy (swollen).  Your vision changes.  You have a stiff neck.  You have trouble breathing. MAKE SURE YOU:   Understand these instructions.  Will watch your condition.  Will get help right away if you are not doing well or get worse. Document Released: 02/05/2008 Document Revised: 05/13/2012 Document Reviewed: 03/24/2012 [facility] Patient Information 2015 [place], [place]. This information is not intended to replace advice given to you by your health care provider. Make sure you discuss any questions you have with your health care provider.

## 2014-09-07 NOTE — Progress Notes (Signed)
Subjective:    Patient ID: Jacqueline Nichols, female    DOB: 03-20-1956, 59 y.o.   MRN: [id number]  HPI  Jacqueline Nichols is a 59 yo female with a CC of frontal sinus pain and rhinorrhea x 7 days.   1) Frontal sinus- bilateral pain, same on both sides. Pt reports she woke up last wed. With this, reports ears are stopped up, really dry nose with yellow/bloody streaked drainage. Worsening tenderness/headache.  Tylenol is not helpful  Saline- helpful    Review of Systems  Constitutional: Negative for fever, chills, diaphoresis and fatigue.  HENT: Positive for congestion, postnasal drip, rhinorrhea, sinus pressure, sneezing, sore throat and voice change. Negative for ear discharge and ear pain.   Eyes: Negative for pain, redness, itching and visual disturbance.  Respiratory: Negative for cough, shortness of breath and wheezing.   Cardiovascular: Negative for chest pain, palpitations and leg swelling.  Gastrointestinal: Negative for nausea, vomiting and diarrhea.  Musculoskeletal: Negative for myalgias.  Skin: Negative for rash.  Neurological: Positive for headaches. Negative for dizziness and weakness.   Past Medical History  Diagnosis Date  . Cancer     skin  . Depression   . Diabetes mellitus   . GERD (gastroesophageal reflux disease)   . Hypertension   . Hyperlipidemia   . Chicken pox   . Colon polyps   . Stroke     brainstem  . Urinary incontinence     History   Social History  . Marital Status: Single    Spouse Name: N/A    Number of Children: N/A  . Years of Education: N/A   Occupational History  . Not on file.   Social History Main Topics  . Smoking status: Former Research scientist (life sciences)  . Smokeless tobacco: Never Used     Comment: Quit Feb 2014  . Alcohol Use: Yes     Comment: social drinking  . Drug Use: No  . Sexual Activity: Not on file   Other Topics Concern  . Not on file   Social History Narrative    Past Surgical History  Procedure Laterality Date  .  Cholecystectomy  2001  . Knee surgery  2003-2009    s/p 8 knee surgeries (with 3 knee replacements)    Family History  Problem Relation Age of Onset  . Colon cancer Mother     died - age 2  . Heart disease Father   . Hypertension Father   . Arthritis/Rheumatoid Sister   . Lung cancer Brother     smoked  . Arthritis/Rheumatoid Paternal Grandmother     Allergies  Allergen Reactions  . Codeine Nausea Only  . Vicodin [Hydrocodone-Acetaminophen] Itching    Current Outpatient Prescriptions on File Prior to Visit  Medication Sig Dispense Refill  . atorvastatin (LIPITOR) 20 MG tablet TAKE ONE (1) TABLET EACH DAY 30 tablet 5  . bisoprolol-hydrochlorothiazide (ZIAC) 2.5-6.25 MG per tablet TAKE ONE (1) TABLET EACH DAY FOR BLOOD PRESSURE 90 tablet 1  . butalbital-acetaminophen-caffeine (FIORICET, ESGIC) 50-325-40 MG per tablet TAKE 1 TABLET BY MOUTH DAILY AS NEEDED FOR HEADACHE 20 tablet 0  . citalopram (CELEXA) 40 MG tablet TAKE ONE (1) TABLET EACH DAY ( FOR DEPRESSION) 90 tablet 1  . clonazePAM (KLONOPIN) 0.5 MG tablet Take 1 tablet (0.5 mg total) by mouth 2 (two) times daily as needed. 30 tablet 1  . clopidogrel (PLAVIX) 75 MG tablet TAKE ONE (1) TABLET EACH DAY 90 tablet 1  . fluticasone (VERAMYST) 27.5 MCG/SPRAY nasal spray  Place 2 sprays into the nose as needed.    . hyoscyamine (ANASPAZ) 0.125 MG TBDP disintergrating tablet Place 0.125 mg under the tongue every 4 (four) hours as needed.    . Loperamide HCl (IMODIUM PO) Take by mouth as needed.    . metoprolol succinate (TOPROL-XL) 25 MG 24 hr tablet Take 1 tablet (25 mg total) by mouth daily. 30 tablet 5  . Misc Natural Products (ALLERGY RELEAF SYSTEM PO) Take by mouth. 24 hour relief    . torsemide (DEMADEX) 20 MG tablet TAKE ONE (1) TABLET EACH DAY AS NEEDED 90 tablet 1  . WELCHOL 625 MG tablet 6 tablets daily     No current facility-administered medications on file prior to visit.      Objective:   Physical Exam    Constitutional: She is oriented to person, place, and time. She appears well-developed and well-nourished. No distress.  HENT:  Head: Normocephalic and atraumatic.  Mouth/Throat: No oropharyngeal exudate.  TM's injected bilaterally. Tender frontal sinus points, maxiallary points non-tender to palpation.   Eyes: Conjunctivae and EOM are normal. Pupils are equal, round, and reactive to light. Right eye exhibits no discharge. Left eye exhibits no discharge. No scleral icterus.  Cardiovascular: Normal rate and regular rhythm.   Pulmonary/Chest: Effort normal and breath sounds normal.  Neurological: She is alert and oriented to person, place, and time.  Skin: Skin is warm and dry. No rash noted. She is not diaphoretic.  Psychiatric: She has a normal mood and affect. Her behavior is normal. Judgment and thought content normal.   BP 130/78 mmHg  Pulse 54  Temp(Src) 98.1 F (36.7 C) (Oral)  Resp 14  Wt 227 lb 6.4 oz (103.148 kg)  SpO2 97%     Assessment & Plan:

## 2014-09-07 NOTE — Progress Notes (Signed)
Pre visit review using our clinic review tool, if applicable. No additional management support is needed unless otherwise documented below in the visit note. 

## 2014-09-08 DIAGNOSIS — H6590 Unspecified nonsuppurative otitis media, unspecified ear: Secondary | ICD-10-CM | POA: Insufficient documentation

## 2014-09-08 DIAGNOSIS — J0110 Acute frontal sinusitis, unspecified: Secondary | ICD-10-CM | POA: Insufficient documentation

## 2014-09-08 NOTE — Assessment & Plan Note (Signed)
Stable. Augmentin was given for sinusitis, amoxicillin should cover if there are active ear infections. Will FU as needed (not improving, worsening, or fever of 101).

## 2014-09-08 NOTE — Assessment & Plan Note (Signed)
Worsening. Rx for augmentin 875-125 mg for 5 days. FU as needed.

## 2014-10-03 ENCOUNTER — Emergency Department: Payer: Self-pay | Admitting: Emergency Medicine

## 2014-10-03 LAB — BASIC METABOLIC PANEL
ANION GAP: 11 (ref 7–16)
BUN: 12 mg/dL (ref 7–18)
CO2: 26 mmol/L (ref 21–32)
Calcium, Total: 9.8 mg/dL (ref 8.5–10.1)
Chloride: 99 mmol/L (ref 98–107)
Creatinine: 1.02 mg/dL (ref 0.60–1.30)
GFR CALC NON AF AMER: 60 — AB
Glucose: 107 mg/dL — ABNORMAL HIGH (ref 65–99)
Osmolality: 272 (ref 275–301)
POTASSIUM: 3.1 mmol/L — AB (ref 3.5–5.1)
Sodium: 136 mmol/L (ref 136–145)

## 2014-10-03 LAB — CBC
HCT: 43.3 % (ref 35.0–47.0)
HGB: 14.6 g/dL (ref 12.0–16.0)
MCH: 28.6 pg (ref 26.0–34.0)
MCHC: 33.8 g/dL (ref 32.0–36.0)
MCV: 85 fL (ref 80–100)
Platelet: 280 10*3/uL (ref 150–440)
RBC: 5.12 10*6/uL (ref 3.80–5.20)
RDW: 13.7 % (ref 11.5–14.5)
WBC: 9.9 10*3/uL (ref 3.6–11.0)

## 2014-10-03 LAB — TROPONIN I: Troponin-I: <0.02 ng/mL

## 2014-10-07 ENCOUNTER — Ambulatory Visit: Payer: 59 | Admitting: Internal Medicine

## 2014-12-23 ENCOUNTER — Ambulatory Visit
Admit: 2014-12-23 | Disposition: A | Discharge status: Home / Self Care | Payer: Self-pay | Attending: Internal Medicine | Admitting: Internal Medicine

## 2014-12-23 LAB — HM MAMMOGRAPHY: HM Mammogram: NEGATIVE

## 2014-12-23 NOTE — Op Note (Signed)
PATIENT NAME:  Jacqueline Nichols, Jacqueline Nichols MR#:  [id number] DATE OF BIRTH:  02/21/1956  DATE OF PROCEDURE:  02/09/2013  PREOPERATIVE DIAGNOSIS: Hiatus hernia with chronic gastroesophageal reflux.   POSTOPERATIVE DIAGNOSIS: Chronic gastroesophageal reflux.   PROCEDURE: Laparoscopic Nissen fundoplication.  ANESTHESIA: General.   SURGEON: Loreli Dollar, MD   INDICATION: This 59 year old female has a 15-year history of chronic gastroesophageal reflux with heartburn. She had a barium swallow suggestive of a small hiatus hernia, and she had failed medical treatment and unable to lose any more weight, and surgery was recommended for definitive treatment.   DESCRIPTION OF PROCEDURE: The patient was placed on the operating table in the supine position under general endotracheal anesthesia. The abdomen was prepared with ChloraPrep and draped in a sterile manner.   A short incision was made approximately 2 inches cephalad to the umbilicus and carried down to the deep fascia, which was grasped with laryngeal hook and elevated. A Veress needle was inserted, aspirated and irrigated with a saline solution. Next, peritoneal cavity was inflated with carbon dioxide. The Veress needle was removed. The 10 mm cannula was inserted. The 10 mm, 30 degree laparoscope was inserted to view the peritoneal cavity. The liver appeared normal. Another incision was made in the subxiphoid position and oriented longitudinally to insert an 11 mm cannula. Another incision was made in the left upper quadrant at the costal margin at the anterior axillary line to introduce an 11 mm cannula. Another incision was made in the right upper quadrant at the costal margin at the midclavicular line to insert an 11 mm cannula and another midway between that site and the camera site, and ultimately a sixth in the right upper quadrant at the anterior axillary line for a total of 6 cannulas. The patient was placed in the reverse Trendelenburg position. The  5-finger fan retractor was introduced to retract the left lobe of the liver and was held in place with the Bookwalter mechanical arm. Babcock clamp was used to apply traction to the stomach, demonstrating the location of the hiatus. There was no hiatus hernia seen. It appeared that the esophagus extended down through the hiatus. Next, the Harmonic scalpel was used to incise the gastrohepatic ligament, and this incision was carried just anterior to the esophagus and over to the left of the esophagus until the left crus was reached. There was a somewhat dense portion of the gastrohepatic ligament which appeared it may contain a vessel and was controlled with double Endoclips proximally and distally and divided with the Harmonic scalpel. The right crus was identified, and Kitner's were used to dissect between the right crus and the esophagus, and a pathway was made posterior to the esophagus, and then anterior to the left crus, a Babcock clamp was inserted from the right and passed posterior to the esophagus and brought out anterior to the left crus of the diaphragm. This pathway was triangulated with the Kitner's and the [place], demonstrating adequate room for passage of the fundus. Next, the fundus was examined, and portions of the gastrosplenic ligament were taken down with the Harmonic scalpel, dividing several of the smaller short gastric vessels. Following this, the fundus was satisfactorily floppy and was passed from left to right posterior to the esophagus, holding it in place with Babcock clamps. More anterior portion of the fundus was brought adjacent to this. Next, the fundoplication was carried out with 0 Surgidac sutures using the Endo Stitch, and placed a total of 4 sutures so that the first  suture was 2 cm from the last. The wrap was satisfactorily floppy. About 0.25 mL of blood was aspirated. Hemostasis was intact. The cannulas were removed, seeing no significant bleeding from the cannula sites. Carbon  dioxide was allowed to escape from the peritoneal cavity. Several subcutaneous bleeding points were cauterized. The wounds were closed with interrupted 4-0 Monocryl subcuticular suture and Dermabond. The patient tolerated surgery satisfactorily and was then prepared for transfer to the recovery room.    ____________________________ Lenna Sciara. Rochel Brome, MD jws:OSi D: 02/09/2013 12:38:10 ET T: 02/09/2013 13:08:11 ET JOB#: [id number]  cc: Loreli Dollar, MD, <Dictator> Loreli Dollar MD ELECTRONICALLY SIGNED 02/10/2013 8:41

## 2014-12-23 NOTE — Discharge Summary (Signed)
PATIENT NAME:  Jacqueline Nichols, Jacqueline Nichols MR#:  [id number] DATE OF BIRTH:  [birth year]  DATE OF ADMISSION:  02/09/2013 DATE OF DISCHARGE:  02/11/2013  HISTORY OF PRESENT ILLNESS: This 59 year old female was admitted electively for laparoscopic Nissen fundoplication. She does have a 15 year history of chronic gastroesophageal reflux.   Details of the history and physical were recorded on the typed [facility] [place]: She came in through the outpatient surgery department and had a laparoscopic Nissen fundoplication and was kept in the hospital for 2 nights for a period of observation. She was initially begun on clear liquid diet, later advanced to a full liquid diet, and had some moderate epigastric discomfort.   FINAL DIAGNOSIS:  Chronic gastroesophageal reflux.   OPERATION:  Laparoscopic Nissen fundoplication.   PLAN:  Take regular strength Tylenol 1 or 2 p.o. q.4 hours p.r.n. for pain. Continue her pantoprazole 40 mg daily for 1 more week. Continue her other home medicines, and we are going to make plans to follow up in the office.    ____________________________ J. Rochel Brome, MD jws:dmm D: 02/11/2013 09:20:34 ET T: 02/11/2013 11:51:44 ET JOB#: [id number]  cc: Loreli Dollar, MD, <Dictator> Loreli Dollar MD ELECTRONICALLY SIGNED 02/22/2013 18:34

## 2014-12-27 ENCOUNTER — Encounter: Payer: Self-pay | Admitting: *Deleted

## 2014-12-30 ENCOUNTER — Other Ambulatory Visit: Payer: Self-pay | Admitting: Internal Medicine

## 2014-12-30 NOTE — Telephone Encounter (Signed)
Last refill 10.30.15.  Please advise refill

## 2015-01-01 NOTE — Telephone Encounter (Signed)
Please confirm with pt - how she is taking the medication.  Refills do not match with instructions.

## 2015-01-02 NOTE — Telephone Encounter (Signed)
Spoke with pt she states she takes Celexa once daily and sometimes on weekends does not take it at all.  Please advise

## 2015-01-02 NOTE — Telephone Encounter (Signed)
rx ok'd for citalopram #90 with no refills.

## 2015-01-02 NOTE — Telephone Encounter (Signed)
Left message on pts work VM to return call

## 2015-01-04 ENCOUNTER — Other Ambulatory Visit: Payer: Self-pay | Admitting: Internal Medicine

## 2015-02-09 ENCOUNTER — Encounter: Payer: 59 | Admitting: Internal Medicine

## 2015-02-27 ENCOUNTER — Other Ambulatory Visit: Payer: Self-pay

## 2015-03-07 ENCOUNTER — Telehealth: Payer: Self-pay

## 2015-03-07 NOTE — Telephone Encounter (Signed)
Pt has upcoming CPE on 04/30/25.  Failed A1C to be addressed at this time.

## 2015-03-10 ENCOUNTER — Telehealth: Payer: Self-pay

## 2015-03-10 NOTE — Telephone Encounter (Signed)
Patient states she gets her lab work drawn at [facility] for free and would like to pick up an order for A1C (and any other lab work) prior to appointment 05/01/15.  If okay, she will be available to come by on 03/17/15.  Please advise.

## 2015-03-13 ENCOUNTER — Encounter: Payer: Self-pay | Admitting: *Deleted

## 2015-03-13 NOTE — Telephone Encounter (Signed)
Order placed in your box.

## 2015-03-13 NOTE — Telephone Encounter (Signed)
Pt voicemail not setup-sent patient a mychart message & placed [facility] order up front for pickup

## 2015-03-24 NOTE — Telephone Encounter (Signed)
Pt called to request [facility] form be mailed. Mail on 7/22.msn

## 2015-04-08 ENCOUNTER — Other Ambulatory Visit: Payer: Self-pay | Admitting: Internal Medicine

## 2015-04-10 ENCOUNTER — Encounter: Payer: Self-pay | Admitting: *Deleted

## 2015-04-12 ENCOUNTER — Encounter: Payer: Self-pay | Admitting: Internal Medicine

## 2015-04-12 LAB — HEPATIC FUNCTION PANEL
ALT: 13 U/L (ref 7–35)
AST: 14 U/L (ref 13–35)
Alkaline Phosphatase: 134 U/L — AB (ref 25–125)
BILIRUBIN DIRECT: 0.1 mg/dL (ref 0.01–0.4)
BILIRUBIN, TOTAL: 0.4 mg/dL

## 2015-04-12 LAB — HEMOGLOBIN A1C: Hgb A1c MFr Bld: 5.5 % (ref 4.0–6.0)

## 2015-04-12 LAB — BASIC METABOLIC PANEL
BUN: 16 mg/dL (ref 4–21)
CREATININE: 0.8 mg/dL (ref 0.5–1.1)
GLUCOSE: 96 mg/dL
POTASSIUM: 4.1 mmol/L (ref 3.4–5.3)
Sodium: 141 mmol/L (ref 137–147)

## 2015-04-12 LAB — LIPID PANEL
Cholesterol: 212 mg/dL — AB (ref 0–200)
HDL: 42 mg/dL (ref 35–70)
LDL Cholesterol: 128 mg/dL
Triglycerides: 209 mg/dL — AB (ref 40–160)

## 2015-04-12 LAB — TSH: TSH: 1.52 u[IU]/mL (ref 0.41–5.90)

## 2015-04-17 ENCOUNTER — Encounter: Payer: Self-pay | Admitting: *Deleted

## 2015-04-18 NOTE — Telephone Encounter (Signed)
[facility] form mailed at pt's request

## 2015-04-18 NOTE — Telephone Encounter (Signed)
Lab order from in your box.  Please mail.  Thanks.

## 2015-04-19 LAB — BASIC METABOLIC PANEL: Glucose: 94 mg/dL

## 2015-04-19 LAB — LIPID PANEL
Cholesterol: 239 mg/dL — AB (ref 0–200)
HDL: 45 mg/dL (ref 35–70)
LDL Cholesterol: 151 mg/dL
TRIGLYCERIDES: 216 mg/dL — AB (ref 40–160)

## 2015-04-19 LAB — HEMOGLOBIN A1C: HEMOGLOBIN A1C: 5.9 % (ref 4.0–6.0)

## 2015-05-01 ENCOUNTER — Encounter: Payer: Self-pay | Admitting: Internal Medicine

## 2015-05-01 ENCOUNTER — Ambulatory Visit (INDEPENDENT_AMBULATORY_CARE_PROVIDER_SITE_OTHER): Payer: 59 | Admitting: Internal Medicine

## 2015-05-01 VITALS — BP 110/80 | HR 54 | Temp 98.3°F | Ht 69.5 in | Wt 214.4 lb

## 2015-05-01 DIAGNOSIS — I10 Essential (primary) hypertension: Secondary | ICD-10-CM

## 2015-05-01 DIAGNOSIS — Z23 Encounter for immunization: Secondary | ICD-10-CM | POA: Diagnosis not present

## 2015-05-01 DIAGNOSIS — K219 Gastro-esophageal reflux disease without esophagitis: Secondary | ICD-10-CM

## 2015-05-01 DIAGNOSIS — Z0000 Encounter for general adult medical examination without abnormal findings: Secondary | ICD-10-CM

## 2015-05-01 DIAGNOSIS — D649 Anemia, unspecified: Secondary | ICD-10-CM

## 2015-05-01 DIAGNOSIS — E7800 Pure hypercholesterolemia, unspecified: Secondary | ICD-10-CM

## 2015-05-01 DIAGNOSIS — R5383 Other fatigue: Secondary | ICD-10-CM

## 2015-05-01 DIAGNOSIS — R748 Abnormal levels of other serum enzymes: Secondary | ICD-10-CM

## 2015-05-01 DIAGNOSIS — E119 Type 2 diabetes mellitus without complications: Secondary | ICD-10-CM

## 2015-05-01 DIAGNOSIS — K589 Irritable bowel syndrome without diarrhea: Secondary | ICD-10-CM

## 2015-05-01 MED ORDER — BUTALBITAL-APAP-CAFFEINE 50-325-40 MG PO TABS: ORAL_TABLET | ORAL | Status: DC

## 2015-05-01 MED ORDER — METOPROLOL SUCCINATE ER 25 MG PO TB24: ORAL_TABLET | ORAL | Status: DC

## 2015-05-01 MED ORDER — BISOPROLOL-HYDROCHLOROTHIAZIDE 2.5-6.25 MG PO TABS: ORAL_TABLET | ORAL | Status: DC

## 2015-05-01 MED ORDER — CITALOPRAM HYDROBROMIDE 40 MG PO TABS: ORAL_TABLET | ORAL | Status: DC

## 2015-05-01 MED ORDER — CLOPIDOGREL BISULFATE 75 MG PO TABS: ORAL_TABLET | ORAL | Status: DC

## 2015-05-01 MED ORDER — CLONAZEPAM 0.5 MG PO TABS: 0.5000 mg | ORAL_TABLET | Freq: Two times a day (BID) | ORAL | Status: DC | PRN

## 2015-05-01 NOTE — Progress Notes (Signed)
Pre visit review using our clinic review tool, if applicable. No additional management support is needed unless otherwise documented below in the visit note. 

## 2015-05-01 NOTE — Progress Notes (Signed)
Patient ID: Jacqueline Nichols, female   DOB: [birth year], 59 y.o.   MRN: [id number]   Subjective:    Patient ID: Jacqueline Nichols, female    DOB: 1955-11-25, 59 y.o.   MRN: [id number]  HPI  Patient here to follow up on her current medical issues as well as for a complete physical exam.  She reports some increased fatigue.  Has lost weight.  Is eating.  States she  Does not eat if she knows she is going to be out.  Will have bowel movement after eating.  Overall she feels her bowels are better.  No cardiac symptoms with increased activity or exertion.  No sob.  No acid reflux reported.  Handling stress.    Past Medical History  Diagnosis Date  . Cancer     skin  . Depression   . Diabetes mellitus   . GERD (gastroesophageal reflux disease)   . Hypertension   . Hyperlipidemia   . Chicken pox   . Colon polyps   . Stroke     brainstem  . Urinary incontinence    Past Surgical History  Procedure Laterality Date  . Cholecystectomy  2001  . Knee surgery  2003-2009    s/p 8 knee surgeries (with 3 knee replacements)   Family History  Problem Relation Age of Onset  . Colon cancer Mother     died - age 89  . Heart disease Father   . Hypertension Father   . Arthritis/Rheumatoid Sister   . Lung cancer Brother     smoked  . Arthritis/Rheumatoid Paternal Grandmother    Social History   Social History  . Marital Status: Divorced    Spouse Name: N/A  . Number of Children: N/A  . Years of Education: N/A   Social History Main Topics  . Smoking status: Former Research scientist (life sciences)  . Smokeless tobacco: Never Used     Comment: Quit Feb 2014  . Alcohol Use: 0.0 oz/week    0 Standard drinks or equivalent per week     Comment: social drinking  . Drug Use: No  . Sexual Activity: Not Asked   Other Topics Concern  . None   Social History Narrative    Outpatient Encounter Prescriptions as of 05/01/2015  Medication Sig  . atorvastatin (LIPITOR) 20 MG tablet TAKE ONE (1) TABLET EACH DAY  .  bisoprolol-hydrochlorothiazide (ZIAC) 2.5-6.25 MG per tablet TAKE ONE (1) TABLET EACH DAY FOR BLOOD PRESSURE  . butalbital-acetaminophen-caffeine (FIORICET, ESGIC) 50-325-40 MG per tablet TAKE ONE (1) TABLET EACH DAY AS NEEDED FOR HEADACHE  . citalopram (CELEXA) 40 MG tablet TAKE ONE (1) TABLET EACH DAY  . clonazePAM (KLONOPIN) 0.5 MG tablet Take 1 tablet (0.5 mg total) by mouth 2 (two) times daily as needed.  . clopidogrel (PLAVIX) 75 MG tablet TAKE ONE (1) TABLET EACH DAY  . fluticasone (VERAMYST) 27.5 MCG/SPRAY nasal spray Place 2 sprays into the nose as needed.  . hyoscyamine (ANASPAZ) 0.125 MG TBDP disintergrating tablet Place 0.125 mg under the tongue every 4 (four) hours as needed.  . Loperamide HCl (IMODIUM PO) Take by mouth as needed.  . metoprolol succinate (TOPROL-XL) 25 MG 24 hr tablet TAKE ONE (1) TABLET EACH DAY  . Misc Natural Products (ALLERGY RELEAF SYSTEM PO) Take by mouth. 24 hour relief  . torsemide (DEMADEX) 20 MG tablet TAKE ONE (1) TABLET EACH DAY AS NEEDED  . WELCHOL 625 MG tablet 6 tablets daily  . [DISCONTINUED] amoxicillin-clavulanate (AUGMENTIN) 875-125 MG per tablet Take  1 tablet by mouth 2 (two) times daily.  . [DISCONTINUED] bisoprolol-hydrochlorothiazide (ZIAC) 2.5-6.25 MG per tablet TAKE ONE (1) TABLET EACH DAY FOR BLOOD PRESSURE  . [DISCONTINUED] butalbital-acetaminophen-caffeine (FIORICET, ESGIC) 50-325-40 MG per tablet TAKE ONE (1) TABLET EACH DAY AS NEEDED FOR HEADACHE  . [DISCONTINUED] citalopram (CELEXA) 40 MG tablet TAKE ONE (1) TABLET EACH DAY  . [DISCONTINUED] clonazePAM (KLONOPIN) 0.5 MG tablet Take 1 tablet (0.5 mg total) by mouth 2 (two) times daily as needed.  . [DISCONTINUED] clopidogrel (PLAVIX) 75 MG tablet TAKE ONE (1) TABLET EACH DAY  . [DISCONTINUED] metoprolol succinate (TOPROL-XL) 25 MG 24 hr tablet TAKE ONE (1) TABLET EACH DAY   No facility-administered encounter medications on file as of 05/01/2015.    Review of Systems  Constitutional:  Positive for fatigue.       Some weight loss.   HENT: Negative for congestion and sinus pressure.   Eyes: Negative for pain and visual disturbance.  Respiratory: Negative for cough, chest tightness and shortness of breath.   Cardiovascular: Negative for chest pain, palpitations and leg swelling.  Gastrointestinal: Negative for nausea, vomiting, abdominal pain and diarrhea.  Genitourinary: Negative for dysuria and difficulty urinating.  Musculoskeletal: Negative for back pain and joint swelling.  Skin: Negative for color change and rash.  Neurological: Negative for dizziness, light-headedness and headaches.  Hematological: Negative for adenopathy. Does not bruise/bleed easily.  Psychiatric/Behavioral: Negative for dysphoric mood and agitation.       Objective:    Physical Exam  Constitutional: She is oriented to person, place, and time. She appears well-developed and well-nourished.  HENT:  Nose: Nose normal.  Mouth/Throat: Oropharynx is clear and moist.  Eyes: Right eye exhibits no discharge. Left eye exhibits no discharge. No scleral icterus.  Neck: Neck supple. No thyromegaly present.  Cardiovascular: Normal rate and regular rhythm.   Pulmonary/Chest: Breath sounds normal. No accessory muscle usage. No tachypnea. No respiratory distress. She has no decreased breath sounds. She has no wheezes. She has no rhonchi. Right breast exhibits no inverted nipple, no mass, no nipple discharge and no tenderness (no axillary adenopathy). Left breast exhibits no inverted nipple, no mass, no nipple discharge and no tenderness (no axilarry adenopathy).  Abdominal: Soft. Bowel sounds are normal. There is no tenderness.  Musculoskeletal: She exhibits no edema or tenderness.  Lymphadenopathy:    She has no cervical adenopathy.  Neurological: She is alert and oriented to person, place, and time.  Skin: Skin is warm. No rash noted. No erythema.  Psychiatric: She has a normal mood and affect. Her  behavior is normal.    BP 110/80 mmHg  Pulse 54  Temp(Src) 98.3 F (36.8 C) (Oral)  Ht 5' 9.5" (1.765 m)  Wt 214 lb 6 oz (97.24 kg)  BMI 31.21 kg/m2  SpO2 94% Wt Readings from Last 3 Encounters:  05/01/15 214 lb 6 oz (97.24 kg)  09/07/14 227 lb 6.4 oz (103.148 kg)  05/27/14 229 lb 12 oz (104.214 kg)     Lab Results  Component Value Date   WBC 9.9 10/03/2014   HGB 14.6 10/03/2014   HCT 43.3 10/03/2014   PLT 280 10/03/2014   GLUCOSE 107* 10/03/2014   CHOL 239* 04/19/2015   TRIG 216* 04/19/2015   HDL 45 04/19/2015   LDLCALC 151 04/19/2015   ALT 13 04/12/2015   AST 14 04/12/2015   NA 141 04/12/2015   K 4.1 04/12/2015   CL 99 10/03/2014   CREATININE 0.8 04/12/2015   BUN 16 04/12/2015  CO2 26 10/03/2014   TSH 1.52 04/12/2015   HGBA1C 5.9 04/19/2015       Assessment & Plan:   Problem List Items Addressed This Visit    Anemia    Follow cbc.       Diabetes mellitus    Has lost weight.  Low carb diet and exercise.  Follow met b and a1c.   Lab Results  Component Value Date   HGBA1C 5.9 04/19/2015        Relevant Medications   bisoprolol-hydrochlorothiazide (ZIAC) 2.5-6.25 MG per tablet   Elevated alkaline phosphatase level    Alkaline phosphatase slightly elevated.  Will fractionate.        Fatigue    Discussed diet and exercise.  Follow.       GERD (gastroesophageal reflux disease)    S/p surgery.  Acid reflux controlled.  Continue protonix.        Health care maintenance    Physical today 05/01/15.  PAP 2014.  Mammogram 12/26/2014 - birads I.        Hypercholesteremia    Low cholesterol diet and exercise.  Taking welchol.  Follow.        Relevant Medications   bisoprolol-hydrochlorothiazide (ZIAC) 2.5-6.25 MG per tablet   metoprolol succinate (TOPROL-XL) 25 MG 24 hr tablet   Hypertension    Blood pressure under good control.  Continue same medication regimen.  Follow pressures.  Follow metabolic panel.        Relevant Medications    bisoprolol-hydrochlorothiazide (ZIAC) 2.5-6.25 MG per tablet   metoprolol succinate (TOPROL-XL) 25 MG 24 hr tablet   IBS (irritable bowel syndrome)    Colonoscopy 05/14/13 - normal.  Recommend f/u colonoscopy in 05/2016.  Bowels stable.  She actually feels better.         Other Visit Diagnoses    Encounter for immunization    -  Primary        Einar Pheasant, MD

## 2015-05-02 ENCOUNTER — Encounter: Payer: Self-pay | Admitting: Internal Medicine

## 2015-05-02 LAB — HEPATIC FUNCTION PANEL: ALK PHOS: 150 U/L — AB (ref 25–125)

## 2015-05-05 LAB — HEPATIC FUNCTION PANEL: Alkaline Phosphatase: 149 U/L — AB (ref 25–125)

## 2015-05-07 ENCOUNTER — Encounter: Payer: Self-pay | Admitting: Internal Medicine

## 2015-05-07 ENCOUNTER — Other Ambulatory Visit: Payer: Self-pay | Admitting: Gastroenterology

## 2015-05-07 DIAGNOSIS — R5383 Other fatigue: Secondary | ICD-10-CM | POA: Insufficient documentation

## 2015-05-07 DIAGNOSIS — Z0000 Encounter for general adult medical examination without abnormal findings: Secondary | ICD-10-CM | POA: Insufficient documentation

## 2015-05-07 DIAGNOSIS — R197 Diarrhea, unspecified: Secondary | ICD-10-CM

## 2015-05-07 NOTE — Assessment & Plan Note (Signed)
Blood pressure under good control.  Continue same medication regimen.  Follow pressures.  Follow metabolic panel.   

## 2015-05-07 NOTE — Assessment & Plan Note (Signed)
Alkaline phosphatase slightly elevated.  Will fractionate.

## 2015-05-07 NOTE — Assessment & Plan Note (Signed)
Has lost weight.  Low carb diet and exercise.  Follow met b and a1c.   Lab Results  Component Value Date   HGBA1C 5.9 04/19/2015

## 2015-05-07 NOTE — Assessment & Plan Note (Signed)
Low cholesterol diet and exercise.  Taking welchol.  Follow.

## 2015-05-07 NOTE — Assessment & Plan Note (Signed)
Discussed diet and exercise.  Follow.  

## 2015-05-07 NOTE — Assessment & Plan Note (Signed)
Colonoscopy 05/14/13 - normal.  Recommend f/u colonoscopy in 05/2016.  Bowels stable.  She actually feels better.

## 2015-05-07 NOTE — Assessment & Plan Note (Signed)
S/p surgery.  Acid reflux controlled.  Continue protonix.

## 2015-05-07 NOTE — Assessment & Plan Note (Signed)
Follow cbc.  

## 2015-05-07 NOTE — Assessment & Plan Note (Signed)
Physical today 05/01/15.  PAP 2014.  Mammogram 12/26/2014 - birads I.

## 2015-05-09 ENCOUNTER — Encounter: Payer: Self-pay | Admitting: Internal Medicine

## 2015-05-10 ENCOUNTER — Telehealth: Payer: Self-pay | Admitting: Internal Medicine

## 2015-05-10 DIAGNOSIS — R748 Abnormal levels of other serum enzymes: Secondary | ICD-10-CM

## 2015-05-10 NOTE — Telephone Encounter (Signed)
Pt notified of elevated alkaline phos - persistent.  Schedule an abdominal ultrasound.

## 2015-05-11 NOTE — Addendum Note (Signed)
Addended by: Alisa Graff on: 05/11/2015 01:33 PM   Modules accepted: Orders

## 2015-05-11 NOTE — Telephone Encounter (Signed)
Order placed for ultrasound.  Pt notified via my chart.

## 2015-05-15 ENCOUNTER — Encounter: Payer: Self-pay | Admitting: Internal Medicine

## 2015-05-17 ENCOUNTER — Ambulatory Visit
Admission: RE | Admit: 2015-05-17 | Discharge: 2015-05-17 | Disposition: A | Discharge status: Home / Self Care | Payer: 59 | Source: Ambulatory Visit | Attending: Internal Medicine | Admitting: Internal Medicine

## 2015-05-17 DIAGNOSIS — R748 Abnormal levels of other serum enzymes: Secondary | ICD-10-CM | POA: Diagnosis not present

## 2015-05-17 DIAGNOSIS — N289 Disorder of kidney and ureter, unspecified: Secondary | ICD-10-CM | POA: Insufficient documentation

## 2015-05-18 ENCOUNTER — Other Ambulatory Visit: Payer: Self-pay | Admitting: Internal Medicine

## 2015-05-18 DIAGNOSIS — N2889 Other specified disorders of kidney and ureter: Secondary | ICD-10-CM

## 2015-05-18 NOTE — Progress Notes (Signed)
Order placed for labs.

## 2015-05-19 LAB — HEPATIC FUNCTION PANEL: Alkaline Phosphatase: 149 U/L — AB (ref 25–125)

## 2015-05-22 ENCOUNTER — Encounter: Payer: Self-pay | Admitting: Internal Medicine

## 2015-05-30 ENCOUNTER — Encounter: Payer: Self-pay | Admitting: Internal Medicine

## 2015-06-02 DIAGNOSIS — N2889 Other specified disorders of kidney and ureter: Secondary | ICD-10-CM | POA: Insufficient documentation

## 2015-06-02 DIAGNOSIS — N3946 Mixed incontinence: Secondary | ICD-10-CM | POA: Insufficient documentation

## 2015-06-12 ENCOUNTER — Encounter: Payer: Self-pay | Admitting: Internal Medicine

## 2015-06-12 DIAGNOSIS — Z85528 Personal history of other malignant neoplasm of kidney: Secondary | ICD-10-CM | POA: Insufficient documentation

## 2015-06-12 DIAGNOSIS — C649 Malignant neoplasm of unspecified kidney, except renal pelvis: Secondary | ICD-10-CM | POA: Insufficient documentation

## 2015-06-23 ENCOUNTER — Telehealth: Payer: Self-pay | Admitting: Internal Medicine

## 2015-06-23 NOTE — Telephone Encounter (Signed)
Placed in red folder  

## 2015-06-23 NOTE — Telephone Encounter (Signed)
Pt came in an dropped off a health screening form to be filled out.. Placed in Dr. Bary Leriche box.. Please advise when ready.Marland Kitchen

## 2015-06-25 NOTE — Telephone Encounter (Signed)
I can see her 06/27/15 at 8:30 to discuss and for form completion.

## 2015-06-27 NOTE — Telephone Encounter (Signed)
Pt states that she was just seen last month & she mentioned the form at that time & you told her to drop it off & you would complete it for her. Please advise when completed

## 2015-06-27 NOTE — Telephone Encounter (Signed)
Left pt a message on work number to come by office to sign &/or pick up form.

## 2015-06-27 NOTE — Telephone Encounter (Signed)
Form completed and placed in your box.  She has to complete her section of the form before can be faxed.

## 2015-07-04 ENCOUNTER — Telehealth: Payer: Self-pay | Admitting: Internal Medicine

## 2015-07-04 HISTORY — PX: NEPHRECTOMY RADICAL: SUR878

## 2015-07-04 NOTE — Telephone Encounter (Signed)
Please notify, I spoke to pt.  She is aware of risk of stopping the aspirin and plavix.  Her stroke was in 2001 and she has had no reoccurring episodes since.  States she has to be off for 7 days prior.  Recommend restarting the aspirin and plavix as soon as possible after the procedure.

## 2015-07-04 NOTE — Telephone Encounter (Signed)
Dr Natividad Brood called regarding pt, pt is going to the OR on the 15th for radical nephrectomy. Question is can she come off of Asprin and plavix? before the surgery due her history of stroke. Dr Natividad Brood [phone]. Thank You!

## 2015-07-05 NOTE — Telephone Encounter (Signed)
Spoke with Dr. Natividad Brood & notified of recommendation & faxed copy to office [phone])

## 2015-07-07 ENCOUNTER — Encounter: Payer: Self-pay | Admitting: Internal Medicine

## 2015-07-07 ENCOUNTER — Ambulatory Visit (INDEPENDENT_AMBULATORY_CARE_PROVIDER_SITE_OTHER): Payer: 59 | Admitting: Internal Medicine

## 2015-07-07 VITALS — BP 120/70 | HR 57 | Temp 98.6°F | Resp 18 | Ht 69.5 in | Wt 224.0 lb

## 2015-07-07 DIAGNOSIS — D649 Anemia, unspecified: Secondary | ICD-10-CM

## 2015-07-07 DIAGNOSIS — E119 Type 2 diabetes mellitus without complications: Secondary | ICD-10-CM

## 2015-07-07 DIAGNOSIS — I10 Essential (primary) hypertension: Secondary | ICD-10-CM | POA: Diagnosis not present

## 2015-07-07 DIAGNOSIS — K589 Irritable bowel syndrome without diarrhea: Secondary | ICD-10-CM | POA: Diagnosis not present

## 2015-07-07 DIAGNOSIS — E7800 Pure hypercholesterolemia, unspecified: Secondary | ICD-10-CM

## 2015-07-07 DIAGNOSIS — K219 Gastro-esophageal reflux disease without esophagitis: Secondary | ICD-10-CM

## 2015-07-07 DIAGNOSIS — N2889 Other specified disorders of kidney and ureter: Secondary | ICD-10-CM

## 2015-07-07 NOTE — Progress Notes (Signed)
Patient ID: Jacqueline Nichols, female   DOB: [birth year], 59 y.o.   MRN: [id number]   Subjective:    Patient ID: Jacqueline Nichols, female    DOB: 1956-08-08, 59 y.o.   MRN: [id number]  HPI  Patient with past history of depression, diabetes, GERD, hypertension, hypercholesterolemia and recently found to have a kidney mass.  She is planning to have a nephrectomy soon.  Just saw cardiology.  Planning to have a stress test next week.  Reports no chest pain or tightness.  No sob.  No acid reflux.  No abdominal pain or cramping.  Bowels stable.  Better with taking welchol and immodium.  Increased stress with the upcoming surgery.  Has had multiple doctor's appointments.  No acute issues.  No increased cough or congestion.  No urinary symptoms.    Past Medical History  Diagnosis Date  . Cancer ([place])     skin  . Depression   . Diabetes mellitus ([place])   . GERD (gastroesophageal reflux disease)   . Hypertension   . Hyperlipidemia   . Chicken pox   . Colon polyps   . Stroke [facility])     brainstem  . Urinary incontinence    Past Surgical History  Procedure Laterality Date  . Cholecystectomy  2001  . Knee surgery  2003-2009    s/p 8 knee surgeries (with 3 knee replacements)   Family History  Problem Relation Age of Onset  . Colon cancer Mother     died - age 37  . Heart disease Father   . Hypertension Father   . Arthritis/Rheumatoid Sister   . Lung cancer Brother     smoked  . Arthritis/Rheumatoid Paternal Grandmother    Social History   Social History  . Marital Status: Divorced    Spouse Name: N/A  . Number of Children: N/A  . Years of Education: N/A   Social History Main Topics  . Smoking status: Former Research scientist (life sciences)  . Smokeless tobacco: Never Used     Comment: Quit Feb 2014  . Alcohol Use: 0.0 oz/week    0 Standard drinks or equivalent per week     Comment: social drinking  . Drug Use: No  . Sexual Activity: Not Asked   Other Topics Concern  . None   Social History Narrative     Outpatient Encounter Prescriptions as of 07/07/2015  Medication Sig  . atorvastatin (LIPITOR) 20 MG tablet TAKE ONE (1) TABLET EACH DAY  . bisoprolol-hydrochlorothiazide (ZIAC) 2.5-6.25 MG per tablet TAKE ONE (1) TABLET EACH DAY FOR BLOOD PRESSURE  . butalbital-acetaminophen-caffeine (FIORICET, ESGIC) 50-325-40 MG per tablet TAKE ONE (1) TABLET EACH DAY AS NEEDED FOR HEADACHE  . citalopram (CELEXA) 40 MG tablet TAKE ONE (1) TABLET EACH DAY  . clonazePAM (KLONOPIN) 0.5 MG tablet Take 1 tablet (0.5 mg total) by mouth 2 (two) times daily as needed.  . clopidogrel (PLAVIX) 75 MG tablet TAKE ONE (1) TABLET EACH DAY  . fluticasone (VERAMYST) 27.5 MCG/SPRAY nasal spray Place 2 sprays into the nose as needed.  . hyoscyamine (ANASPAZ) 0.125 MG TBDP disintergrating tablet Place 0.125 mg under the tongue every 4 (four) hours as needed.  . Loperamide HCl (IMODIUM PO) Take by mouth as needed.  . metoprolol succinate (TOPROL-XL) 25 MG 24 hr tablet TAKE ONE (1) TABLET EACH DAY  . Misc Natural Products (ALLERGY RELEAF SYSTEM PO) Take by mouth. 24 hour relief  . torsemide (DEMADEX) 20 MG tablet TAKE ONE (1) TABLET EACH DAY AS NEEDED  .  WELCHOL 625 MG tablet Take 6 tablets by mouth  daily   No facility-administered encounter medications on file as of 07/07/2015.    Review of Systems  Constitutional: Negative for appetite change and unexpected weight change.  HENT: Negative for congestion and sinus pressure.   Eyes: Negative for pain and discharge.  Respiratory: Negative for cough, chest tightness and shortness of breath.   Cardiovascular: Negative for chest pain, palpitations and leg swelling.  Gastrointestinal: Negative for nausea, vomiting and abdominal pain.       Bowels better on wellchol and immodium.   Genitourinary: Negative for dysuria and difficulty urinating.  Musculoskeletal: Negative for joint swelling and neck stiffness.  Skin: Negative for color change and rash.  Neurological: Negative  for dizziness, light-headedness and headaches.  Psychiatric/Behavioral: Negative for dysphoric mood and agitation.       Increased stress with multiple doctor's appts and upcoming surgery.         Objective:    Physical Exam  Constitutional: She appears well-developed and well-nourished. No distress.  HENT:  Nose: Nose normal.  Mouth/Throat: Oropharynx is clear and moist.  Eyes: Conjunctivae are normal. Right eye exhibits no discharge. Left eye exhibits no discharge.  Neck: Neck supple. No thyromegaly present.  Cardiovascular: Normal rate and regular rhythm.   Pulmonary/Chest: Breath sounds normal. No respiratory distress. She has no wheezes.  Abdominal: Soft. Bowel sounds are normal. There is no tenderness.  Musculoskeletal: She exhibits no edema or tenderness.  Lymphadenopathy:    She has no cervical adenopathy.  Skin: No rash noted. No erythema.  Psychiatric: She has a normal mood and affect. Her behavior is normal.    BP 120/70 mmHg  Pulse 57  Temp(Src) 98.6 F (37 C) (Oral)  Resp 18  Ht 5' 9.5" (1.765 m)  Wt 224 lb (101.606 kg)  BMI 32.62 kg/m2  SpO2 96% Wt Readings from Last 3 Encounters:  07/07/15 224 lb (101.606 kg)  05/01/15 214 lb 6 oz (97.24 kg)  09/07/14 227 lb 6.4 oz (103.148 kg)     Lab Results  Component Value Date   WBC 9.9 10/03/2014   HGB 14.6 10/03/2014   HCT 43.3 10/03/2014   PLT 280 10/03/2014   GLUCOSE 107* 10/03/2014   CHOL 239* 04/19/2015   TRIG 216* 04/19/2015   HDL 45 04/19/2015   LDLCALC 151 04/19/2015   ALT 13 04/12/2015   AST 14 04/12/2015   NA 141 04/12/2015   K 4.1 04/12/2015   CL 99 10/03/2014   CREATININE 0.8 04/12/2015   BUN 16 04/12/2015   CO2 26 10/03/2014   TSH 1.52 04/12/2015   HGBA1C 5.9 04/19/2015    US Abdomen Complete  05/17/2015  CLINICAL DATA:  Elevated alkaline phosphatase. EXAM: ULTRASOUND ABDOMEN COMPLETE COMPARISON:  None. FINDINGS: Gallbladder: Cholecystectomy. Common bile duct: Diameter: 8.5 mm. Mild  prominence of the common bile duct can be seen following cholecystectomy. No obstructing lesions identified. Given elevated alkaline phosphatase MRCP can be obtained to further evaluate however. Liver: No focal lesion identified. Within normal limits in parenchymal echogenicity. IVC: No abnormality visualized. Pancreas: Normal where visualized. Spleen: Size and appearance within normal limits. Right Kidney: Length: 11.7 cm. Echogenicity within normal limits. No mass or hydronephrosis visualized. Left Kidney: Length: 11.8 cm. Echogenicity within normal limits. No hydronephrosis visualized. There is a large solid 7.9 x 10.6 x 9.3 cm mass in the left upper renal pole. This is suspicious for a renal cell carcinoma. Gadolinium-enhanced MRI of the abdomen suggested for further evaluation.  Abdominal aorta: No aneurysm visualized. Other findings: None. IMPRESSION: 1. Cholecystectomy . Common bile duct measures 8.5 mm. Mild biliary prominence can be seen following cholecystectomy. No obstructing lesion identified. Given patient's elevated alkaline phosphatase, MRCP can be obtained to further evaluate. 2. Large solid 7.9 x 2.6 x 9.3 cm mass left upper renal pole. This mass is suspicious for renal cell carcinoma. Gadolinium-enhanced MRI of the abdomen suggested for further evaluation . Electronically Signed   By: Marcello Moores  Register   On: 05/17/2015 10:25       Assessment & Plan:   Problem List Items Addressed This Visit    Anemia    hgb checked 10/03/14 - wnl.        Diabetes mellitus ([place])    Low carb diet and exercise.  Follow met b and a1c.       GERD (gastroesophageal reflux disease)    S/p surgery.  Acid reflux controlled.  Continue protonix.        Hypercholesteremia    On welchol.  Low cholesterol diet and exercise.  Follow lipid panel.        Hypertension - Primary    Blood pressure under good control.  Continue same medication regimen.  Follow pressures.  Follow metabolic panel.  Will need close  intra op and post op monitoring of her heart rate and blood pressure to avoid extremes.        IBS (irritable bowel syndrome)    Colonoscopy 05/14/13 - recommended f/u colonoscopy in 05/2016.  Bowels doing better on imodium and wellchol.  Follow.        Renal mass    Evaluated by Dr [name].  Planning for nephrectomy.  Saw cardiology recently.  Planning for stress test next week.  Cardiac clearance per cardiology.            Einar Pheasant, MD

## 2015-07-07 NOTE — Progress Notes (Signed)
Pre-visit discussion using our clinic review tool. No additional management support is needed unless otherwise documented below in the visit note.  

## 2015-07-09 ENCOUNTER — Encounter: Payer: Self-pay | Admitting: Internal Medicine

## 2015-07-09 NOTE — Assessment & Plan Note (Signed)
hgb checked 10/03/14 - wnl.

## 2015-07-09 NOTE — Assessment & Plan Note (Signed)
Colonoscopy 05/14/13 - recommended f/u colonoscopy in 05/2016.  Bowels doing better on imodium and wellchol.  Follow.

## 2015-07-09 NOTE — Assessment & Plan Note (Signed)
On welchol.  Low cholesterol diet and exercise.  Follow lipid panel.   

## 2015-07-09 NOTE — Assessment & Plan Note (Signed)
Low carb diet and exercise.  Follow met b and a1c.  

## 2015-07-09 NOTE — Assessment & Plan Note (Signed)
Evaluated by Dr [name].  Planning for nephrectomy.  Saw cardiology recently.  Planning for stress test next week.  Cardiac clearance per cardiology.

## 2015-07-09 NOTE — Assessment & Plan Note (Signed)
S/p surgery.  Acid reflux controlled.  Continue protonix.

## 2015-07-09 NOTE — Assessment & Plan Note (Signed)
Blood pressure under good control.  Continue same medication regimen.  Follow pressures.  Follow metabolic panel.  Will need close intra op and post op monitoring of her heart rate and blood pressure to avoid extremes.

## 2015-07-20 ENCOUNTER — Telehealth: Payer: Self-pay | Admitting: *Deleted

## 2015-07-20 NOTE — Telephone Encounter (Signed)
Patient wanted to [facility]  Dr Nicki Reaper that her surgery for her kidneys, went well.

## 2015-07-21 ENCOUNTER — Emergency Department: Payer: 59

## 2015-07-21 ENCOUNTER — Emergency Department
Admission: EM | Admit: 2015-07-21 | Discharge: 2015-07-21 | Disposition: A | Discharge status: Home / Self Care | Payer: 59 | Attending: Emergency Medicine | Admitting: Emergency Medicine

## 2015-07-21 ENCOUNTER — Encounter: Payer: Self-pay | Admitting: *Deleted

## 2015-07-21 DIAGNOSIS — Z87891 Personal history of nicotine dependence: Secondary | ICD-10-CM | POA: Diagnosis not present

## 2015-07-21 DIAGNOSIS — E119 Type 2 diabetes mellitus without complications: Secondary | ICD-10-CM | POA: Diagnosis not present

## 2015-07-21 DIAGNOSIS — Z7902 Long term (current) use of antithrombotics/antiplatelets: Secondary | ICD-10-CM | POA: Diagnosis not present

## 2015-07-21 DIAGNOSIS — Z79899 Other long term (current) drug therapy: Secondary | ICD-10-CM | POA: Diagnosis not present

## 2015-07-21 DIAGNOSIS — I10 Essential (primary) hypertension: Secondary | ICD-10-CM | POA: Insufficient documentation

## 2015-07-21 DIAGNOSIS — R079 Chest pain, unspecified: Secondary | ICD-10-CM | POA: Diagnosis present

## 2015-07-21 DIAGNOSIS — I471 Supraventricular tachycardia: Secondary | ICD-10-CM | POA: Diagnosis not present

## 2015-07-21 LAB — BASIC METABOLIC PANEL
ANION GAP: 5 (ref 5–15)
BUN: 13 mg/dL (ref 6–20)
CO2: 29 mmol/L (ref 22–32)
Calcium: 8.8 mg/dL — ABNORMAL LOW (ref 8.9–10.3)
Chloride: 102 mmol/L (ref 101–111)
Creatinine, Ser: 0.97 mg/dL (ref 0.44–1.00)
GLUCOSE: 107 mg/dL — AB (ref 65–99)
POTASSIUM: 4.2 mmol/L (ref 3.5–5.1)
Sodium: 136 mmol/L (ref 135–145)

## 2015-07-21 LAB — CBC
HEMATOCRIT: 38.1 % (ref 35.0–47.0)
Hemoglobin: 12.4 g/dL (ref 12.0–16.0)
MCH: 27.3 pg (ref 26.0–34.0)
MCHC: 32.7 g/dL (ref 32.0–36.0)
MCV: 83.4 fL (ref 80.0–100.0)
Platelets: 189 10*3/uL (ref 150–440)
RBC: 4.56 MIL/uL (ref 3.80–5.20)
RDW: 13.7 % (ref 11.5–14.5)
WBC: 8.2 10*3/uL (ref 3.6–11.0)

## 2015-07-21 LAB — MAGNESIUM: MAGNESIUM: 2.1 mg/dL (ref 1.7–2.4)

## 2015-07-21 LAB — TROPONIN I: Troponin I: 0.07 ng/mL — ABNORMAL HIGH (ref <0.031)

## 2015-07-21 MED ORDER — ACETAMINOPHEN 500 MG PO TABS
1000.0000 mg | ORAL_TABLET | Freq: Once | ORAL | Status: AC
  Administered 2015-07-21: 1000 mg via ORAL
  Filled 2015-07-21: qty 2

## 2015-07-21 NOTE — ED Notes (Signed)
Pt has left kidney removed Tuesday

## 2015-07-21 NOTE — Telephone Encounter (Signed)
Sent mychart message

## 2015-07-21 NOTE — Telephone Encounter (Signed)
Great.  Please call her and inform her to let us know if she needs anything and keep [place] posted.   Thanks

## 2015-07-21 NOTE — ED Notes (Signed)
Pt reports chest pain and heart racing this morning

## 2015-07-21 NOTE — ED Notes (Signed)
Pts HR decreased from 166 to 79 after patient began to cry. Pt continues to deny pain.

## 2015-07-21 NOTE — Discharge Instructions (Signed)
Please follow up closely with Dr. Clayborn Bigness. Return to the emergency room right away if you develop shortness of breath, any recurrence of any chest pain, your palpitations or racing heart return, or other new concerns arise.  Paroxysmal Supraventricular Tachycardia Paroxysmal supraventricular tachycardia (PSVT) is a type of abnormal heart rhythm. It causes your heart to beat very quickly and then suddenly stop beating so quickly. A normal heart rate is 60-100 beats per minute. During an episode of PSVT, your heart rate may be 150-250 beats per minute. This can make you feel light-headed and short of breath. An episode of PSVT can be frightening. It is usually not dangerous. The heart has four chambers. All chambers need to work together for the heart to beat effectively. A normal heartbeat usually starts in the right upper chamber of the heart (atrium) when an area (sinoatrial node) puts out an electrical signal that spreads to the other chambers. People with PSVT may have abnormal electrical pathways, or they may have other areas in the upper chambers that send out electrical signals. The result is a very rapid heartbeat. When your heart beats very quickly, it does not have time to fill completely with blood. When PSVT happens often or it lasts for long periods, it can lead to heart weakness and failure. Most people with PSVT do not have any other heart disease. CAUSES Abnormal electrical activity in the heart causes PSVT. It is not known why some people get PSVT and others do not. RISK FACTORS You may be more likely to have PSVT if:  You are 66-65 years old.  You are a woman. Other factors that may increase your chances of an attack include:  Stress.  Being tired.  Smoking.  Stimulant drugs.  Alcoholic drinks.  Caffeine.  Pregnancy. SIGNS AND SYMPTOMS A mild episode of PSVT may cause no symptoms. If you do have signs and symptoms, they may include:  A pounding heart.  Feeling of  skipped heartbeats (palpitations).  Weakness.  Shortness of breath.  Tightness or pain in your chest.  Light-headedness.  Anxiety.  Dizziness.  Sweating.  Nausea.  A fainting spell. DIAGNOSIS Your health care provider may suspect PSVT if you have symptoms that come and go. The health care provider will do a physical exam. If you are having an episode during the exam, the health care provider may be able to diagnose PSVT by listening to your heart and feeling your pulse. Tests may also be done, including:  An electrical study of your heart (electrocardiogram, or ECG).  A test in which you wear a portable ECG monitor all day (Holter monitor) or for several days (event monitor).  A test that involves taking an image of your heart using sound waves (echocardiogram) to rule out other causes of a fast heart rate. TREATMENT You may not need treatment if episodes of PSVT do not happen often or if they do not cause symptoms. If PSVT episodes do cause symptoms, your health care provider may first suggest trying a self-treatment called vagus nerve stimulation. The vagus nerve extends down from the brain. It regulates certain body functions. Stimulating this nerve can slow down the heart. Your health care provider can teach you ways to do this. You may need to try a few ways to find what works best for you. Options include:  Holding your breath and pushing, as though you are having a bowel movement.  Massaging an area on one side of your neck below your jaw.  Bending  forward with your head between your legs.  Bending forward with your head between your legs and coughing.  Massaging your eyeballs with your eyes closed. If vagus nerve stimulation does not work, other treatment options include:  Medicines to prevent an attack.  Being treated in the hospital with medicine or electric shock to stop an attack (cardioversion). This treatment can include:  Getting medicine through an IV  line.  Having a small electric shock delivered to your heart. You will be given medicine to make you sleep through this procedure.  If you have frequent episodes with symptoms, you may need a procedure to get rid of the faulty areas of your heart (radiofrequency ablation) and end the episodes of PSVT. In this procedure:  A long, thin tube (catheter) is passed through one of your veins into your heart.  Energy directed through the catheter eliminates the areas of your heart that are causing abnormal electric stimulation. HOME CARE INSTRUCTIONS  Take medicines only as directed by your health care provider.  Do not use caffeine in any form if caffeine triggers episodes of PSVT. Otherwise, consume caffeine in moderation. This means no more than a few cups of coffee or the equivalent each day.  Do not drink alcohol if alcohol triggers episodes of PSVT. Otherwise, limit alcohol intake to no more than 1 drink per day for nonpregnant women and 2 drinks per day for men. One drink equals 12 ounces of beer, 5 ounces of wine, or 1 ounces of hard liquor.  Do not use any tobacco products, including cigarettes, chewing tobacco, or electronic cigarettes. If you need help quitting, ask your health care provider.  Try to get at least 7 hours of sleep each night.  Find healthy ways to manage stress.  Perform vagus nerve stimulation as directed by your health care provider.  Maintain a healthy weight.  Get some exercise on most days. Ask your health care provider to suggest some good activities for you. SEEK MEDICAL CARE IF:  You are having episodes of PSVT more often, or they are lasting longer.  Vagus nerve stimulation is no longer helping.  You have new symptoms during an episode. SEEK IMMEDIATE MEDICAL CARE IF:  You have chest pain or trouble breathing.  You have an episode of PSVT that has lasted longer than 20 minutes.  You have passed out from an episode of PSVT. These symptoms may  represent a serious problem that is an emergency. Do not wait to see if the symptoms will go away. Get medical help right away. Call your local emergency services (911 in the [place] Do not drive yourself to the hospital.   This information is not intended to replace advice given to you by your health care provider. Make sure you discuss any questions you have with your health care provider.   Document Released: 08/19/2005 Document Revised: 09/09/2014 Document Reviewed: 01/27/2014 Elsevier Interactive Patient Education [employer].

## 2015-07-21 NOTE — ED Provider Notes (Signed)
[facility] Emergency Department Provider Note REMINDER - THIS NOTE IS NOT A FINAL MEDICAL RECORD UNTIL IT IS SIGNED. UNTIL THEN, THE CONTENT BELOW MAY REFLECT INFORMATION FROM A DOCUMENTATION TEMPLATE, NOT THE ACTUAL PATIENT VISIT. ____________________________________________  Time seen: Approximately 9:57 AM  I have reviewed the triage vital signs and the nursing notes.   HISTORY  Chief Complaint Chest Pain    HPI Jacqueline Nichols is a 59 y.o. female the previous history of arrhythmia, also renal cancer with a nephrectomy performed last week.  The patient presents today states that she woke up and her heart was racing, after about 30 minutes it started to hurt and cause some discomfort across her chest. She reports she has had this happen to her many times before, and she is receiving medicine the ER Fix it. Quite sure of the medication, but reports it was the medicine use to "stop her heart" first few seconds. She has tried an ice pack, Valsalva maneuvers but unable to improve her symptoms so she came to the ER after taking a metoprolol tablet  She denies any shortness of breath, she has not had a swelling in her legs, denies pain in her legs or thighs. She has no previous history of any blood clots. The present time she reports her heart feels like it's racing.   Past Medical History  Diagnosis Date  . Cancer ([place])     skin  . Depression   . Diabetes mellitus ([place])   . GERD (gastroesophageal reflux disease)   . Hypertension   . Hyperlipidemia   . Chicken pox   . Colon polyps   . 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 Urinary incontinence     Patient Active Problem List   Diagnosis Date Noted  . Renal mass 06/12/2015  . Fatigue 05/07/2015  . Health care maintenance 05/07/2015  . Non-suppurative otitis media 09/08/2014  . Sinusitis, acute frontal 09/08/2014  . Nasal lesion 01/30/2014  . Elevated alkaline phosphatase level 10/17/2013  . Bradycardia  08/22/2013  . Diarrhea 04/18/2013  . Tachycardia 10/25/2012  . Anemia 10/25/2012  . Diabetes mellitus ([place]) 06/28/2012  . Hypertension 06/28/2012  . Hypercholesteremia 06/28/2012  . GERD (gastroesophageal reflux disease) 06/28/2012  . IBS (irritable bowel syndrome) 06/28/2012    Past Surgical History  Procedure Laterality Date  . Cholecystectomy  2001  . Knee surgery  2003-2009    s/p 8 knee surgeries (with 3 knee replacements)    Current Outpatient Rx  Name  Route  Sig  Dispense  Refill  . atorvastatin (LIPITOR) 20 MG tablet      TAKE ONE (1) TABLET EACH DAY   30 tablet   5   . bisoprolol-hydrochlorothiazide (ZIAC) 2.5-6.25 MG per tablet      TAKE ONE (1) TABLET EACH DAY FOR BLOOD PRESSURE   30 tablet   2   . butalbital-acetaminophen-caffeine (FIORICET, ESGIC) 50-325-40 MG per tablet      TAKE ONE (1) TABLET EACH DAY AS NEEDED FOR HEADACHE   20 tablet   0   . citalopram (CELEXA) 40 MG tablet      TAKE ONE (1) TABLET EACH DAY   30 tablet   2   . clonazePAM (KLONOPIN) 0.5 MG tablet   Oral   Take 1 tablet (0.5 mg total) by mouth 2 (two) times daily as needed.   30 tablet   0   . clopidogrel (PLAVIX) 75 MG tablet  TAKE ONE (1) TABLET EACH DAY   90 tablet   1   . fluticasone (VERAMYST) 27.5 MCG/SPRAY nasal spray   Nasal   Place 2 sprays into the nose as needed.         . hyoscyamine (ANASPAZ) 0.125 MG TBDP disintergrating tablet   Sublingual   Place 0.125 mg under the tongue every 4 (four) hours as needed.         . Loperamide HCl (IMODIUM PO)   Oral   Take by mouth as needed.         . metoprolol succinate (TOPROL-XL) 25 MG 24 hr tablet      TAKE ONE (1) TABLET EACH DAY   90 tablet   1   . Misc Natural Products (ALLERGY RELEAF SYSTEM PO)   Oral   Take by mouth. 24 hour relief         . torsemide (DEMADEX) 20 MG tablet      TAKE ONE (1) TABLET EACH DAY AS NEEDED   90 tablet   1     PT WOULD LIKE A 90 DAY SUPPLY!!!!!   .  WELCHOL 625 MG tablet      Take 6 tablets by mouth  daily   540 tablet   3     Allergies Codeine and Vicodin  Family History  Problem Relation Age of Onset  . Colon cancer Mother     died - age 84  . Heart disease Father   . Hypertension Father   . Arthritis/Rheumatoid Sister   . Lung cancer Brother     smoked  . Arthritis/Rheumatoid Paternal Grandmother     Social History Social History  Substance Use Topics  . Smoking status: Former Research scientist (life sciences)  . Smokeless tobacco: Never Used     Comment: Quit Feb 2014  . Alcohol Use: 0.0 oz/week    0 Standard drinks or equivalent per week     Comment: social drinking    Review of Systems Constitutional: No fever/chills Eyes: No visual changes. ENT: No sore throat. Cardiovascular: See history of present illness Respiratory: Denies shortness of breath. Gastrointestinal: No abdominal pain.  No nausea, no vomiting.  No diarrhea.  No constipation. Reports she is recovering well from her surgery. Genitourinary: Negative for dysuria. Musculoskeletal: Negative for back pain. Skin: Negative for rash. Neurological: Negative for headaches, focal weakness or numbness.  10-point ROS otherwise negative.  ____________________________________________   PHYSICAL EXAM:  VITAL SIGNS: ED Triage Vitals  Enc Vitals Group     BP 07/21/15 0940 135/108 mmHg     Pulse Rate 07/21/15 0939 178     Resp 07/21/15 0939 24     Temp 07/21/15 0939 98 F (36.7 C)     Temp Source 07/21/15 0939 Oral     SpO2 07/21/15 0939 98 %     Weight 07/21/15 0939 220 lb (99.791 kg)     Height 07/21/15 0939 5\' 10"  (1.778 m)     Head Cir --      Peak Flow --      Pain Score 07/21/15 0939 1     Pain Loc --      Pain Edu? --      Excl. in [place]? --    Constitutional: Alert and oriented. Well appearing and in no acute distress. Eyes: Conjunctivae are normal. PERRL. EOMI. Head: Atraumatic. Nose: No congestion/rhinnorhea. Mouth/Throat: Mucous membranes are moist.   Oropharynx non-erythematous. Neck: No stridor.   Cardiovascular: Tachycardic rate, regular rhythm. Grossly normal  heart sounds.  Good peripheral circulation. Respiratory: Normal respiratory effort.  No retractions. Lungs CTAB. Gastrointestinal: Soft and nontender. No distention. No abdominal bruits. No CVA tenderness. Musculoskeletal: No lower extremity tenderness nor edema.  No joint effusions. Neurologic:  Normal speech and language. No gross focal neurologic deficits are appreciated. Skin:  Skin is warm, dry and intact. No rash noted. Psychiatric: Mood and affect are normal. Speech and behavior are normal.  ____________________________________________   LABS (all labs ordered are listed, but only abnormal results are displayed)  Labs Reviewed  TROPONIN I - Abnormal; Notable for the following:    Troponin I 0.07 (*)    All other components within normal limits  BASIC METABOLIC PANEL - Abnormal; Notable for the following:    Glucose, Bld 107 (*)    Calcium 8.8 (*)    All other components within normal limits  MAGNESIUM  CBC   ____________________________________________  EKG  Initial EKG reviewed and interpreted by me at 9:35 AM Supraventricular tachycardia, ventricular rate of approximately 170, no flutter waves are noted, no P waves  QTc 460 QRS 88 Possible very slight depressions of the T waves laterally, likely rate related but could represent ischemia, no ST elevation  And repeat CPK EKG performed after Valsalva, which patient's SVT resolved at 949 reviewed and interpreted as ventricular rate 73 Sinus rhythm with occasional PVCs No acute ischemic changes noted, QTC 340, PR interval 140 QRS 60  EKG #3 performed at 9:53 AM Ventricular rate 73 QRS 80 PR 180 QTC 410 Normal sinus rhythm, no ischemic changes, reviewed and interpreted as normal EKG ____________________________________________  RADIOLOGY  DG Chest 1 View (Final result) Result time: 07/21/15 10:20:03    Final result by Rad Results In Interface (07/21/15 10:20:03)   Narrative:   CLINICAL DATA: Tachycardia.  EXAM: CHEST 1 VIEW  COMPARISON: October 03, 2014.  FINDINGS: The heart size and mediastinal contours are within normal limits. Both lungs are clear. No pneumothorax or pleural effusion is noted. The visualized skeletal structures are unremarkable.  IMPRESSION: No acute cardiopulmonary abnormality seen.    ____________________________________________   PROCEDURES  Procedure(s) performed: None  Critical Care performed: No  ____________________________________________   INITIAL IMPRESSION / ASSESSMENT AND PLAN / ED COURSE  Pertinent labs & imaging results that were available during my care of the patient were reviewed by me and considered in my medical decision making (see chart for details).  She presents with supraventricular tachycardia. She has a history of this, and reports same symptoms previously with improvement after what appears to be adenosine. Today her presentation is likely, given the fact that she has a recent nephrectomy, so we will obtain labs to check electrolytes and kidney function, also obtain a chest x-ray as she is postop status to evaluate for infiltrates or congestive heart failure though she has no signs of these based on clinical exam. Her lungs are clear. Her symptoms broke, and thereafter her repeat EKG at 953 demonstrate normal sinus rhythm without any ischemic changes and her symptoms have resolved. I certainly do consider the possibility of pulmonary embolism given her recent surgery, however now that her arrhythmia has resolved she denies any symptoms in her she has no shortness of breath or chest pain. She has no clinical signs of DVT and pulmonary most would be extremely unlikely in this present setting with resolution of her symptoms and a history of SVT.  No evidence of ischemia other than some chest discomfort that occurred with her  arrhythmia and is  now resolved, I will send a single troponin and if this is negative I would feel quite reassured this does not represent acute coronary syndrome. Patient had recent provocative stress testing and an echo prior to her surgery by Dr. Clayborn Bigness.   ----------------------------------------- 1:29 PM on 07/21/2015 -----------------------------------------  Patient asymptomatic. Remains in normal sinus rhythm. Discussed the case with Dr. Clayborn Bigness no sore well, she had a recent stress test and echocardiogram. We discussed the patient's positive troponin in the setting of SVT, and also her recent surgery. Dr. Clayborn Bigness advises that she is a symptom back now with return of normal sinus rhythm that she can be safely discharged and should follow-up closely with him in the next week to discuss potentially setting up a ablation. Patient is agreeable, careful return precautions advised.  ____________________________________________   FINAL CLINICAL IMPRESSION(S) / ED DIAGNOSES  Final diagnoses:  SVT (supraventricular tachycardia) (H[facility])      Delman Kitten, MD 07/21/15 1330

## 2015-10-06 ENCOUNTER — Encounter: Payer: Self-pay | Admitting: Internal Medicine

## 2015-10-06 ENCOUNTER — Ambulatory Visit (INDEPENDENT_AMBULATORY_CARE_PROVIDER_SITE_OTHER): Payer: 59 | Admitting: Internal Medicine

## 2015-10-06 ENCOUNTER — Telehealth: Payer: Self-pay | Admitting: Internal Medicine

## 2015-10-06 VITALS — BP 118/80 | HR 54 | Temp 97.8°F | Resp 18 | Ht 69.5 in | Wt 229.0 lb

## 2015-10-06 DIAGNOSIS — R5383 Other fatigue: Secondary | ICD-10-CM

## 2015-10-06 DIAGNOSIS — E119 Type 2 diabetes mellitus without complications: Secondary | ICD-10-CM

## 2015-10-06 DIAGNOSIS — R000 Tachycardia, unspecified: Secondary | ICD-10-CM

## 2015-10-06 DIAGNOSIS — K589 Irritable bowel syndrome without diarrhea: Secondary | ICD-10-CM | POA: Diagnosis not present

## 2015-10-06 DIAGNOSIS — I10 Essential (primary) hypertension: Secondary | ICD-10-CM | POA: Diagnosis not present

## 2015-10-06 DIAGNOSIS — E7800 Pure hypercholesterolemia, unspecified: Secondary | ICD-10-CM

## 2015-10-06 DIAGNOSIS — K219 Gastro-esophageal reflux disease without esophagitis: Secondary | ICD-10-CM | POA: Diagnosis not present

## 2015-10-06 DIAGNOSIS — D649 Anemia, unspecified: Secondary | ICD-10-CM

## 2015-10-06 DIAGNOSIS — R001 Bradycardia, unspecified: Secondary | ICD-10-CM

## 2015-10-06 DIAGNOSIS — C642 Malignant neoplasm of left kidney, except renal pelvis: Secondary | ICD-10-CM

## 2015-10-06 NOTE — Progress Notes (Signed)
Pre-visit discussion using our clinic review tool. No additional management support is needed unless otherwise documented below in the visit note.  

## 2015-10-06 NOTE — Telephone Encounter (Signed)
Please advise 

## 2015-10-06 NOTE — Telephone Encounter (Signed)
Pt is checking out and she states she will get her labs done at [place]. Can the requisition be mailed to her at home? Call pt @ [phone]. Thank you!

## 2015-10-06 NOTE — Telephone Encounter (Signed)
Lab slip placed in mail box

## 2015-10-06 NOTE — Telephone Encounter (Signed)
Lab order completed and placed in your box.

## 2015-10-06 NOTE — Progress Notes (Signed)
Patient ID: Jacqueline Nichols, female   DOB: Mar 24, 1956, 60 y.o.   MRN: [id number]   Subjective:    Patient ID: Jacqueline Nichols, female    DOB: 1955-09-09, 60 y.o.   MRN: [id number]  HPI  Patient with past history of diabetes, GERD, hypertension and hypercholesterolemia.  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  S/p left radical nephrectomy and partial adrenalectomy.  Has done well since her surgery.  Plans to f/u with Dr [name].  Planning for 6 month f/u CT scan.  She is eating and drinking.  Tries to stay hydrated.  Discussed diet and exercise.  No chest pain.  Does report increased heart rate and palpitations.  Went to ER in 07/2015.  Has not followed up with cardiology.   Would like to hold on f/u at this time.  Feels is stable.  Bowels overall relatively stable.  Does report stool is "thin".  Has been like this for a while.  Due f/u colonoscopy in 05/2016.  Feels handling stress relatively well.  Increased stress at work.     Past Medical History  Diagnosis Date  . Cancer ([place])     skin  . Depression   . Diabetes mellitus ([place])   . GERD (gastroesophageal reflux disease)   . Hypertension   . Hyperlipidemia   . Chicken pox   . Colon polyps   . 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 Urinary incontinence    Past Surgical History  Procedure Laterality Date  . Cholecystectomy  2001  . Knee surgery  2003-2009    s/p 8 knee surgeries (with 3 knee replacements)   Family History  Problem Relation Age of Onset  . Colon cancer Mother     died - age 51  . Heart disease Father   . Hypertension Father   . Arthritis/Rheumatoid Sister   . Lung cancer Brother     smoked  . Arthritis/Rheumatoid Paternal Grandmother    Social History   Social History  . Marital Status: Single    Spouse Name: N/A  . Number of Children: N/A  . Years of Education: N/A   Social History Main Topics  . Smoking status: Former Research scientist (life sciences)  . Smokeless tobacco: Never Used     Comment: Quit Feb 2014  .  Alcohol Use: 0.0 oz/week    0 Standard drinks or equivalent per week     Comment: social drinking  . Drug Use: No  . Sexual Activity: Not Asked   Other Topics Concern  . None   Social History Narrative    Outpatient Encounter Prescriptions as of 10/06/2015  Medication Sig  . atorvastatin (LIPITOR) 20 MG tablet TAKE ONE (1) TABLET EACH DAY  . bisoprolol-hydrochlorothiazide (ZIAC) 2.5-6.25 MG per tablet TAKE ONE (1) TABLET EACH DAY FOR BLOOD PRESSURE  . butalbital-acetaminophen-caffeine (FIORICET, ESGIC) 50-325-40 MG per tablet TAKE ONE (1) TABLET EACH DAY AS NEEDED FOR HEADACHE  . citalopram (CELEXA) 40 MG tablet TAKE ONE (1) TABLET EACH DAY  . clonazePAM (KLONOPIN) 0.5 MG tablet Take 1 tablet (0.5 mg total) by mouth 2 (two) times daily as needed.  . clopidogrel (PLAVIX) 75 MG tablet TAKE ONE (1) TABLET EACH DAY  . fluticasone (VERAMYST) 27.5 MCG/SPRAY nasal spray Place 2 sprays into the nose as needed.  . hyoscyamine (ANASPAZ) 0.125 MG TBDP disintergrating tablet Place 0.125 mg under the tongue every 4 (four) hours as needed.  . Loperamide HCl (IMODIUM PO) Take by mouth as needed.  Marland Kitchen  metoprolol succinate (TOPROL-XL) 25 MG 24 hr tablet TAKE ONE (1) TABLET EACH DAY  . Misc Natural Products (ALLERGY RELEAF SYSTEM PO) Take by mouth. 24 hour relief  . torsemide (DEMADEX) 20 MG tablet TAKE ONE (1) TABLET EACH DAY AS NEEDED  . WELCHOL 625 MG tablet Take 6 tablets by mouth  daily   No facility-administered encounter medications on file as of 10/06/2015.    Review of Systems  Constitutional: Positive for fatigue. Negative for appetite change and unexpected weight change.  HENT: Negative for congestion and sinus pressure.   Eyes: Negative for discharge and redness.  Respiratory: Negative for cough, chest tightness and shortness of breath.   Cardiovascular: Positive for palpitations. Negative for chest pain and leg swelling.  Gastrointestinal: Negative for nausea, vomiting and abdominal pain.         Stools as outlined.    Genitourinary: Negative for dysuria and difficulty urinating.  Musculoskeletal: Negative for back pain and joint swelling.  Skin: Negative for color change and rash.  Neurological: Negative for dizziness, light-headedness and headaches.  Psychiatric/Behavioral: Negative for dysphoric mood and agitation.       Objective:    Physical Exam  Constitutional: She appears well-developed and well-nourished. No distress.  HENT:  Nose: Nose normal.  Mouth/Throat: Oropharynx is clear and moist.  Eyes: Conjunctivae are normal. Right eye exhibits no discharge. Left eye exhibits no discharge.  Neck: Neck supple. No thyromegaly present.  Cardiovascular: Normal rate and regular rhythm.   Pulmonary/Chest: Breath sounds normal. No respiratory distress. She has no wheezes.  Abdominal: Soft. Bowel sounds are normal. There is no tenderness.  Well healed incision site - mid abdomen.    Musculoskeletal: She exhibits no edema or tenderness.  Lymphadenopathy:    She has no cervical adenopathy.  Skin: No rash noted. No erythema.  Psychiatric: She has a normal mood and affect. Her behavior is normal.    BP 118/80 mmHg  Pulse 54  Temp(Src) 97.8 F (36.6 C) (Oral)  Resp 18  Ht 5' 9.5" (1.765 m)  Wt 229 lb (103.874 kg)  BMI 33.34 kg/m2  SpO2 95% Wt Readings from Last 3 Encounters:  10/06/15 229 lb (103.874 kg)  07/21/15 220 lb (99.791 kg)  07/07/15 224 lb (101.606 kg)     Lab Results  Component Value Date   WBC 8.2 07/21/2015   HGB 12.4 07/21/2015   HCT 38.1 07/21/2015   PLT 189 07/21/2015   GLUCOSE 107* 07/21/2015   CHOL 239* 04/19/2015   TRIG 216* 04/19/2015   HDL 45 04/19/2015   LDLCALC 151 04/19/2015   ALT 13 04/12/2015   AST 14 04/12/2015   NA 136 07/21/2015   K 4.2 07/21/2015   CL 102 07/21/2015   CREATININE 0.97 07/21/2015   BUN 13 07/21/2015   CO2 29 07/21/2015   TSH 1.52 04/12/2015   HGBA1C 5.9 04/19/2015    Dg Chest 1 View  07/21/2015   CLINICAL DATA:  Tachycardia. EXAM: CHEST 1 VIEW COMPARISON:  October 03, 2014. FINDINGS: The heart size and mediastinal contours are within normal limits. Both lungs are clear. No pneumothorax or pleural effusion is noted. The visualized skeletal structures are unremarkable. IMPRESSION: No acute cardiopulmonary abnormality seen. Electronically Signed   By: Marijo Conception, M.D.   On: 07/21/2015 10:20       Assessment & Plan:   Problem List Items Addressed This Visit    Anemia    Follow cbc.        Bradycardia  On toprol as outlined for increased heart rate.  Follow.        Diabetes mellitus ([place])    Low carb diet and exercise.  Follow met b and a1c.        Relevant Orders   Hemoglobin A1c   Microalbumin / creatinine urine ratio   Fatigue    Feel is multifactorial.  Had recent surgery.  Does feel improving.  Follow.  Check labs as outlined.       GERD (gastroesophageal reflux disease)    Acid reflux appears to be controlled.  Follow.        Hypercholesteremia    On welchol.  Discussed low cholesterol diet and exercise.  Follow lipid panel.        Relevant Orders   Lipid panel   Hepatic function panel   Hypertension - Primary    Blood pressure under good control.  Continue same medication regimen.  Follow pressures.  Follow metabolic panel.        Relevant Orders   Basic metabolic panel   IBS (irritable bowel syndrome)    Colonoscopy 9/12/124 - recommended f/u colonoscopy in 05/2016.  Bowel issues as outlined.  Overall relatively stable.  Follow.        Renal cell carcinoma ([facility])    Is s/p left radical nephrectomy and partial adrenalectomy.  Followed by urology.  Going to f/u with Dr [name].  Planning for f/u CT scan in 6 months.        Tachycardia    Sees Dr Clayborn Bigness.  Was in ER 07/2015.  Still has noticeable palpitations and episodes of increased heart rate.  Nothing like what took her into the ER.  Discussed referral back to Dr Clayborn Bigness.  She prefers to follow for  now.  Check labs.        Relevant Orders   TSH       Einar Pheasant, MD

## 2015-10-08 ENCOUNTER — Encounter: Payer: Self-pay | Admitting: Internal Medicine

## 2015-10-08 NOTE — Assessment & Plan Note (Signed)
Acid reflux appears to be controlled.  Follow.

## 2015-10-08 NOTE — Assessment & Plan Note (Signed)
Low carb diet and exercise.  Follow met b and a1c.   

## 2015-10-08 NOTE — Assessment & Plan Note (Signed)
Is s/p left radical nephrectomy and partial adrenalectomy.  Followed by urology.  Going to f/u with Dr [name].  Planning for f/u CT scan in 6 months.

## 2015-10-08 NOTE — Assessment & Plan Note (Signed)
Feel is multifactorial.  Had recent surgery.  Does feel improving.  Follow.  Check labs as outlined.

## 2015-10-08 NOTE — Assessment & Plan Note (Signed)
On welchol.  Discussed low cholesterol diet and exercise.  Follow lipid panel.

## 2015-10-08 NOTE — Assessment & Plan Note (Signed)
On toprol as outlined for increased heart rate.  Follow.

## 2015-10-08 NOTE — Assessment & Plan Note (Signed)
Colonoscopy 9/12/124 - recommended f/u colonoscopy in 05/2016.  Bowel issues as outlined.  Overall relatively stable.  Follow.

## 2015-10-08 NOTE — Assessment & Plan Note (Signed)
Blood pressure under good control.  Continue same medication regimen.  Follow pressures.  Follow metabolic panel.   

## 2015-10-08 NOTE — Assessment & Plan Note (Signed)
Follow cbc.  

## 2015-10-08 NOTE — Assessment & Plan Note (Signed)
Sees Dr Clayborn Bigness.  Was in ER 07/2015.  Still has noticeable palpitations and episodes of increased heart rate.  Nothing like what took her into the ER.  Discussed referral back to Dr Clayborn Bigness.  She prefers to follow for now.  Check labs.

## 2015-10-19 ENCOUNTER — Other Ambulatory Visit: Payer: Self-pay | Admitting: Internal Medicine

## 2015-10-19 NOTE — Telephone Encounter (Signed)
Okay to refill: Last OV: 10/06/2015 & next OV: 01/04/16. Last refilled in August

## 2015-10-20 NOTE — Telephone Encounter (Signed)
Ok to refill citalopram x 3.  Need to clarify with pt need for clonazepam.  How often taking?

## 2015-10-23 ENCOUNTER — Encounter: Payer: Self-pay | Admitting: *Deleted

## 2015-10-24 NOTE — Telephone Encounter (Signed)
Pt is taking the Clonazepam once a day per mychart response.

## 2015-10-24 NOTE — Telephone Encounter (Signed)
Left pt a message to speak to her about the Meloxicam & I have called the pharmacy to cancel the refills on the Meloxicam.

## 2015-10-24 NOTE — Telephone Encounter (Signed)
She just had kidney surgery with one kidney removed.  Reviewed medication history.  I don't think that I have been refilling meloxicam for her.  I would recommend her not taking meloxicam - given her recent kidney surgery.  I do not want to refill the meloxicam.

## 2015-10-25 MED ORDER — CLONAZEPAM 0.5 MG PO TABS: 0.5000 mg | ORAL_TABLET | Freq: Every day | ORAL | Status: DC | PRN

## 2015-10-25 NOTE — Telephone Encounter (Signed)
ok'd refill clonazepam #30 with one refill.

## 2015-10-25 NOTE — Telephone Encounter (Signed)
Rx faxed

## 2015-10-30 ENCOUNTER — Other Ambulatory Visit: Payer: Self-pay | Admitting: Internal Medicine

## 2015-10-31 LAB — HEPATIC FUNCTION PANEL
ALBUMIN: 4.5 g/dL (ref 3.5–5.5)
ALK PHOS: 121 IU/L — AB (ref 39–117)
ALT: 16 IU/L (ref 0–32)
AST: 21 IU/L (ref 0–40)
Bilirubin Total: 0.4 mg/dL (ref 0.0–1.2)
Bilirubin, Direct: 0.08 mg/dL (ref 0.00–0.40)
Total Protein: 7 g/dL (ref 6.0–8.5)

## 2015-10-31 LAB — LIPID PANEL W/O CHOL/HDL RATIO
CHOLESTEROL TOTAL: 267 mg/dL — AB (ref 100–199)
HDL: 53 mg/dL (ref >39)
LDL Calculated: 174 mg/dL — ABNORMAL HIGH (ref 0–99)
Triglycerides: 199 mg/dL — ABNORMAL HIGH (ref 0–149)
VLDL Cholesterol Cal: 40 mg/dL (ref 5–40)

## 2015-10-31 LAB — BASIC METABOLIC PANEL
BUN/Creatinine Ratio: 21 (ref 9–23)
BUN: 23 mg/dL (ref 6–24)
CALCIUM: 9.5 mg/dL (ref 8.7–10.2)
CHLORIDE: 97 mmol/L (ref 96–106)
CO2: 25 mmol/L (ref 18–29)
Creatinine, Ser: 1.11 mg/dL — ABNORMAL HIGH (ref 0.57–1.00)
GFR calc Af Amer: 64 mL/min/{1.73_m2} (ref >59)
GFR calc non Af Amer: 55 mL/min/{1.73_m2} — ABNORMAL LOW (ref >59)
GLUCOSE: 97 mg/dL (ref 65–99)
POTASSIUM: 4.1 mmol/L (ref 3.5–5.2)
Sodium: 137 mmol/L (ref 134–144)

## 2015-10-31 LAB — HGB A1C W/O EAG: HEMOGLOBIN A1C: 5.6 % (ref 4.8–5.6)

## 2015-11-02 ENCOUNTER — Encounter: Payer: Self-pay | Admitting: *Deleted

## 2015-11-02 ENCOUNTER — Other Ambulatory Visit: Payer: Self-pay | Admitting: Internal Medicine

## 2015-11-02 DIAGNOSIS — R7989 Other specified abnormal findings of blood chemistry: Secondary | ICD-10-CM

## 2015-11-02 NOTE — Progress Notes (Signed)
Order placed for met b 

## 2015-11-07 NOTE — Telephone Encounter (Signed)
Unread mychart message mailed to patient 

## 2015-11-15 ENCOUNTER — Encounter: Payer: Self-pay | Admitting: *Deleted

## 2015-11-16 ENCOUNTER — Other Ambulatory Visit: Payer: 59

## 2015-11-17 ENCOUNTER — Other Ambulatory Visit: Payer: 59

## 2016-01-04 ENCOUNTER — Ambulatory Visit: Payer: 59 | Admitting: Internal Medicine

## 2016-01-04 ENCOUNTER — Emergency Department
Admission: EM | Admit: 2016-01-04 | Discharge: 2016-01-04 | Disposition: A | Discharge status: Home / Self Care | Payer: 59 | Attending: Emergency Medicine | Admitting: Emergency Medicine

## 2016-01-04 DIAGNOSIS — E119 Type 2 diabetes mellitus without complications: Secondary | ICD-10-CM | POA: Insufficient documentation

## 2016-01-04 DIAGNOSIS — Z85828 Personal history of other malignant neoplasm of skin: Secondary | ICD-10-CM | POA: Diagnosis not present

## 2016-01-04 DIAGNOSIS — F329 Major depressive disorder, single episode, unspecified: Secondary | ICD-10-CM | POA: Diagnosis not present

## 2016-01-04 DIAGNOSIS — E785 Hyperlipidemia, unspecified: Secondary | ICD-10-CM | POA: Diagnosis not present

## 2016-01-04 DIAGNOSIS — Z79899 Other long term (current) drug therapy: Secondary | ICD-10-CM | POA: Diagnosis not present

## 2016-01-04 DIAGNOSIS — Z8673 Personal history of transient ischemic attack (TIA), and cerebral infarction without residual deficits: Secondary | ICD-10-CM | POA: Diagnosis not present

## 2016-01-04 DIAGNOSIS — Z87891 Personal history of nicotine dependence: Secondary | ICD-10-CM | POA: Insufficient documentation

## 2016-01-04 DIAGNOSIS — R002 Palpitations: Secondary | ICD-10-CM | POA: Diagnosis present

## 2016-01-04 DIAGNOSIS — I471 Supraventricular tachycardia: Secondary | ICD-10-CM | POA: Insufficient documentation

## 2016-01-04 DIAGNOSIS — Z85528 Personal history of other malignant neoplasm of kidney: Secondary | ICD-10-CM | POA: Insufficient documentation

## 2016-01-04 DIAGNOSIS — I10 Essential (primary) hypertension: Secondary | ICD-10-CM | POA: Insufficient documentation

## 2016-01-04 LAB — COMPREHENSIVE METABOLIC PANEL
ALBUMIN: 4 g/dL (ref 3.5–5.0)
ALT: 19 U/L (ref 14–54)
ANION GAP: 8 (ref 5–15)
AST: 23 U/L (ref 15–41)
Alkaline Phosphatase: 100 U/L (ref 38–126)
BUN: 28 mg/dL — AB (ref 6–20)
CHLORIDE: 103 mmol/L (ref 101–111)
CO2: 25 mmol/L (ref 22–32)
Calcium: 8.9 mg/dL (ref 8.9–10.3)
Creatinine, Ser: 1.09 mg/dL — ABNORMAL HIGH (ref 0.44–1.00)
GFR calc Af Amer: >60 mL/min (ref >60)
GFR calc non Af Amer: 55 mL/min — ABNORMAL LOW (ref >60)
GLUCOSE: 104 mg/dL — AB (ref 65–99)
POTASSIUM: 3.2 mmol/L — AB (ref 3.5–5.1)
SODIUM: 136 mmol/L (ref 135–145)
Total Bilirubin: 0.5 mg/dL (ref 0.3–1.2)
Total Protein: 6.5 g/dL (ref 6.5–8.1)

## 2016-01-04 LAB — CBC
HEMATOCRIT: 41 % (ref 35.0–47.0)
Hemoglobin: 13.9 g/dL (ref 12.0–16.0)
MCH: 28.7 pg (ref 26.0–34.0)
MCHC: 33.9 g/dL (ref 32.0–36.0)
MCV: 84.8 fL (ref 80.0–100.0)
PLATELETS: 189 10*3/uL (ref 150–440)
RBC: 4.83 MIL/uL (ref 3.80–5.20)
RDW: 13.5 % (ref 11.5–14.5)
WBC: 4.8 10*3/uL (ref 3.6–11.0)

## 2016-01-04 LAB — TROPONIN I: Troponin I: <0.03 ng/mL (ref <0.031)

## 2016-01-04 NOTE — ED Notes (Signed)
Pt states she felt her heart having palpitations, with dizziness and diaphoresis at approx. 09:30 this morning.  Pt has strong history of SVT.  Pt took total of 37.5mg  of PO metoprolol after symptoms began.  She also attempted vagal maneuver with no success.  EMS was called and pt was in SVT in 170's on EMS arrival.  Pt was converted out of SVT with 6mg  of Adenosine in Ambulance into SR with HR now in 60's.  On ED arrival, patient alert and oriented with no signs of distress and VS WNL.

## 2016-01-04 NOTE — Discharge Instructions (Signed)
Paroxysmal Supraventricular Tachycardia Paroxysmal supraventricular tachycardia (PSVT) is a type of abnormal heart rhythm. It causes your heart to beat very quickly and then suddenly stop beating so quickly. A normal heart rate is 60-100 beats per minute. During an episode of PSVT, your heart rate may be 150-250 beats per minute. This can make you feel light-headed and short of breath. An episode of PSVT can be frightening. It is usually not dangerous. The heart has four chambers. All chambers need to work together for the heart to beat effectively. A normal heartbeat usually starts in the right upper chamber of the heart (atrium) when an area (sinoatrial node) puts out an electrical signal that spreads to the other chambers. People with PSVT may have abnormal electrical pathways, or they may have other areas in the upper chambers that send out electrical signals. The result is a very rapid heartbeat. When your heart beats very quickly, it does not have time to fill completely with blood. When PSVT happens often or it lasts for long periods, it can lead to heart weakness and failure. Most people with PSVT do not have any other heart disease. CAUSES Abnormal electrical activity in the heart causes PSVT. It is not known why some people get PSVT and others do not. RISK FACTORS You may be more likely to have PSVT if:  You are 20-30 years old.  You are a woman. Other factors that may increase your chances of an attack include:  Stress.  Being tired.  Smoking.  Stimulant drugs.  Alcoholic drinks.  Caffeine.  Pregnancy. SIGNS AND SYMPTOMS A mild episode of PSVT may cause no symptoms. If you do have signs and symptoms, they may include:  A pounding heart.  Feeling of skipped heartbeats (palpitations).  Weakness.  Shortness of breath.  Tightness or pain in your chest.  Light-headedness.  Anxiety.  Dizziness.  Sweating.  Nausea.  A fainting spell. DIAGNOSIS Your health care  provider may suspect PSVT if you have symptoms that come and go. The health care provider will do a physical exam. If you are having an episode during the exam, the health care provider may be able to diagnose PSVT by listening to your heart and feeling your pulse. Tests may also be done, including:  An electrical study of your heart (electrocardiogram, or ECG).  A test in which you wear a portable ECG monitor all day (Holter monitor) or for several days (event monitor).  A test that involves taking an image of your heart using sound waves (echocardiogram) to rule out other causes of a fast heart rate. TREATMENT You may not need treatment if episodes of PSVT do not happen often or if they do not cause symptoms. If PSVT episodes do cause symptoms, your health care provider may first suggest trying a self-treatment called vagus nerve stimulation. The vagus nerve extends down from the brain. It regulates certain body functions. Stimulating this nerve can slow down the heart. Your health care provider can teach you ways to do this. You may need to try a few ways to find what works best for you. Options include:  Holding your breath and pushing, as though you are having a bowel movement.  Massaging an area on one side of your neck below your jaw.  Bending forward with your head between your legs.  Bending forward with your head between your legs and coughing.  Massaging your eyeballs with your eyes closed. If vagus nerve stimulation does not work, other treatment options include:    Medicines to prevent an attack.  Being treated in the hospital with medicine or electric shock to stop an attack (cardioversion). This treatment can include:  Getting medicine through an IV line.  Having a small electric shock delivered to your heart. You will be given medicine to make you sleep through this procedure.  If you have frequent episodes with symptoms, you may need a procedure to get rid of the faulty  areas of your heart (radiofrequency ablation) and end the episodes of PSVT. In this procedure:  A long, thin tube (catheter) is passed through one of your veins into your heart.  Energy directed through the catheter eliminates the areas of your heart that are causing abnormal electric stimulation. HOME CARE INSTRUCTIONS  Take medicines only as directed by your health care provider.  Do not use caffeine in any form if caffeine triggers episodes of PSVT. Otherwise, consume caffeine in moderation. This means no more than a few cups of coffee or the equivalent each day.  Do not drink alcohol if alcohol triggers episodes of PSVT. Otherwise, limit alcohol intake to no more than 1 drink per day for nonpregnant women and 2 drinks per day for men. One drink equals 12 ounces of beer, 5 ounces of wine, or 1 ounces of hard liquor.  Do not use any tobacco products, including cigarettes, chewing tobacco, or electronic cigarettes. If you need help quitting, ask your health care provider.  Try to get at least 7 hours of sleep each night.  Find healthy ways to manage stress.  Perform vagus nerve stimulation as directed by your health care provider.  Maintain a healthy weight.  Get some exercise on most days. Ask your health care provider to suggest some good activities for you. SEEK MEDICAL CARE IF:  You are having episodes of PSVT more often, or they are lasting longer.  Vagus nerve stimulation is no longer helping.  You have new symptoms during an episode. SEEK IMMEDIATE MEDICAL CARE IF:  You have chest pain or trouble breathing.  You have an episode of PSVT that has lasted longer than 20 minutes.  You have passed out from an episode of PSVT. These symptoms may represent a serious problem that is an emergency. Do not wait to see if the symptoms will go away. Get medical help right away. Call your local emergency services (911 in the [place] Do not drive yourself to the hospital.   This  information is not intended to replace advice given to you by your health care provider. Make sure you discuss any questions you have with your health care provider.   Document Released: 08/19/2005 Document Revised: 09/09/2014 Document Reviewed: 01/27/2014 [employer] Interactive Patient Education 2016 [employer]  

## 2016-01-04 NOTE — ED Provider Notes (Signed)
[facility] Emergency Department Provider Note  ____________________________________________    I have reviewed the triage vital signs and the nursing notes.   HISTORY  Chief Complaint Palpitations    HPI Jacqueline Nichols is a 60 y.o. female who presents with complaints of palpitations. Patient reports a strong history of SVT. She has had it many times in the past. Today she felt her heart started palpitating and so she took a dose of metoprolol waited 30 minutes and then took another dose without improvement. She attempted vagal maneuvers without success. So that she called EMS. EMS gave her 6 mg of adenosine and she converted to sinus rhythm. Currently she feels well and has no complaints. No chest pain or shortness of breath.    Past Medical History  Diagnosis Date  . Cancer ([place])     skin  . Depression   . Diabetes mellitus ([place])   . GERD (gastroesophageal reflux disease)   . Hypertension   . Hyperlipidemia   . Chicken pox   . Colon polyps   . 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 Urinary incontinence     Patient Active Problem List   Diagnosis Date Noted  . Renal cell carcinoma ([place]) 06/12/2015  . Fatigue 05/07/2015  . Health care maintenance 05/07/2015  . Non-suppurative otitis media 09/08/2014  . Sinusitis, acute frontal 09/08/2014  . Nasal lesion 01/30/2014  . Elevated alkaline phosphatase level 10/17/2013  . Bradycardia 08/22/2013  . Diarrhea 04/18/2013  . Tachycardia 10/25/2012  . Anemia 10/25/2012  . Diabetes mellitus ([place]) 06/28/2012  . Hypertension 06/28/2012  . Hypercholesteremia 06/28/2012  . GERD (gastroesophageal reflux disease) 06/28/2012  . IBS (irritable bowel syndrome) 06/28/2012    Past Surgical History  Procedure Laterality Date  . Cholecystectomy  2001  . Knee surgery  2003-2009    s/p 8 knee surgeries (with 3 knee replacements)    Current Outpatient Rx  Name  Route  Sig  Dispense  Refill  . atorvastatin (LIPITOR)  20 MG tablet      TAKE ONE (1) TABLET EACH DAY   30 tablet   5   . bisoprolol-hydrochlorothiazide (ZIAC) 2.5-6.25 MG tablet      TAKE 1 TABLET BY MOUTH EVERY DAY FOR HIGH BLOOD PRESSURE   30 tablet   11   . butalbital-acetaminophen-caffeine (FIORICET, ESGIC) 50-325-40 MG per tablet      TAKE ONE (1) TABLET EACH DAY AS NEEDED FOR HEADACHE   20 tablet   0   . citalopram (CELEXA) 40 MG tablet      TAKE 1 TABLET BY MOUTH EVERY DAY.   30 tablet   3   . clonazePAM (KLONOPIN) 0.5 MG tablet   Oral   Take 1 tablet (0.5 mg total) by mouth daily as needed.   30 tablet   1   . clopidogrel (PLAVIX) 75 MG tablet      TAKE ONE (1) TABLET EACH DAY   90 tablet   1   . fluticasone (VERAMYST) 27.5 MCG/SPRAY nasal spray   Nasal   Place 2 sprays into the nose as needed.         . hyoscyamine (ANASPAZ) 0.125 MG TBDP disintergrating tablet   Sublingual   Place 0.125 mg under the tongue every 4 (four) hours as needed.         . Loperamide HCl (IMODIUM PO)   Oral   Take by mouth as needed.         Marland Kitchen  meloxicam (MOBIC) 7.5 MG tablet      TAKE ONE TABLET TWICE DAILY   60 tablet   5   . metoprolol succinate (TOPROL-XL) 25 MG 24 hr tablet      TAKE ONE (1) TABLET EACH DAY   90 tablet   1   . Misc Natural Products (ALLERGY RELEAF SYSTEM PO)   Oral   Take by mouth. 24 hour relief         . torsemide (DEMADEX) 20 MG tablet      TAKE ONE (1) TABLET EACH DAY AS NEEDED   90 tablet   1     PT WOULD LIKE A 90 DAY SUPPLY!!!!!   . WELCHOL 625 MG tablet      Take 6 tablets by mouth  daily   540 tablet   3     Allergies Codeine and Vicodin  Family History  Problem Relation Age of Onset  . Colon cancer Mother     died - age 60  . Heart disease Father   . Hypertension Father   . Arthritis/Rheumatoid Sister   . Lung cancer Brother     smoked  . Arthritis/Rheumatoid Paternal Grandmother     Social History Social History  Substance Use Topics  . Smoking  status: Former Research scientist (life sciences)  . Smokeless tobacco: Never Used     Comment: Quit Feb 2014  . Alcohol Use: 0.0 oz/week    0 Standard drinks or equivalent per week     Comment: social drinking    Review of Systems  Constitutional: Negative for fever. Eyes: Negative for redness ENT: Negative for sore throat Cardiovascular: As above Respiratory: Negative for shortness of breath. Gastrointestinal: Negative for abdominal pain Genitourinary: Negative for dysuria. Musculoskeletal: Negative for back pain. Skin: Negative for rash. Neurological: Negative for focal weakness Psychiatric: no anxiety    ____________________________________________   PHYSICAL EXAM:  VITAL SIGNS: ED Triage Vitals  Enc Vitals Group     BP 01/04/16 1141 111/62 mmHg     Pulse Rate 01/04/16 1141 62     Resp 01/04/16 1141 13     Temp 01/04/16 1141 97.5 F (36.4 C)     Temp Source 01/04/16 1141 Oral     SpO2 01/04/16 1141 96 %     Weight 01/04/16 1141 220 lb (99.791 kg)     Height 01/04/16 1141 5\' 10"  (1.778 m)     Head Cir --      Peak Flow --      Pain Score --      Pain Loc --      Pain Edu? --      Excl. in [place]? --      Constitutional: Alert and oriented. Well appearing and in no distress. Pleasant and interactive Eyes: Conjunctivae are normal. No erythema or injection ENT   Head: Normocephalic and atraumatic.   Mouth/Throat: Mucous membranes are moist. Cardiovascular: Normal rate, regular rhythm. Normal and symmetric distal pulses are present in the upper extremities. No murmurs or rubs  Respiratory: Normal respiratory effort without tachypnea nor retractions. Breath sounds are clear and equal bilaterally.  Gastrointestinal: Soft and non-tender in all quadrants. No distention. There is no CVA tenderness. Genitourinary: deferred Musculoskeletal: Nontender with normal range of motion in all extremities. No lower extremity tenderness nor edema. Neurologic:  Normal speech and language. No gross  focal neurologic deficits are appreciated. Skin:  Skin is warm, dry and intact. No rash noted. Psychiatric: Mood and affect are normal. Patient exhibits  appropriate insight and judgment.  ____________________________________________    LABS (pertinent positives/negatives)  Labs Reviewed  COMPREHENSIVE METABOLIC PANEL - Abnormal; Notable for the following:    Potassium 3.2 (*)    Glucose, Bld 104 (*)    BUN 28 (*)    Creatinine, Ser 1.09 (*)    GFR calc non Af Amer 55 (*)    All other components within normal limits  CBC  TROPONIN I    ____________________________________________   EKG  ED ECG REPORT I, Lavonia Drafts, the attending physician, personally viewed and interpreted this ECG.  Date: 01/04/2016 EKG Time: 11:34 AM Rate: 66 Rhythm: normal sinus rhythm QRS Axis: normal Intervals: normal ST/T Wave abnormalities: normal Conduction Disturbances: none Narrative Interpretation: unremarkable   ____________________________________________    RADIOLOGY  None  ____________________________________________   PROCEDURES  Procedure(s) performed: none  Critical Care performed: none  ____________________________________________   INITIAL IMPRESSION / ASSESSMENT AND PLAN / ED COURSE  Pertinent labs & imaging results that were available during my care of the patient were reviewed by me and considered in my medical decision making (see chart for details).  Patient presents after episode of SVT which converted after adenosine given by EMS. Currently she feels well. We will check EKG, labs and monitor the patient   ----------------------------------------- 1:27 PM on 01/04/2016 -----------------------------------------  Patient feels well and is anxious to go home. She agrees to ambulate around the department to make sure no recurrence of her SVT  ____________________________________________   FINAL CLINICAL IMPRESSION(S) / ED DIAGNOSES  Final  diagnoses:  SVT (supraventricular tachycardia) (HCC)          Lavonia Drafts, MD 01/04/16 [id number]

## 2016-01-04 NOTE — ED Notes (Signed)
Pt ambulated in the hallway 287ft and remained in normal sinus rhythm with HR in 80's.  No distress noted during ambulation.

## 2016-01-18 ENCOUNTER — Other Ambulatory Visit: Payer: Self-pay | Admitting: Internal Medicine

## 2016-02-19 ENCOUNTER — Other Ambulatory Visit: Payer: Self-pay | Admitting: Internal Medicine

## 2016-03-20 ENCOUNTER — Telehealth: Payer: Self-pay | Admitting: Gastroenterology

## 2016-03-20 DIAGNOSIS — Z8679 Personal history of other diseases of the circulatory system: Secondary | ICD-10-CM | POA: Insufficient documentation

## 2016-03-20 NOTE — Telephone Encounter (Signed)
Gastroenterology Pre-Procedure Review  Request Date:  Requesting Physician: Dr. Einar Pheasant  PATIENT REVIEW QUESTIONS: The patient responded to the following health history questions as indicated:    1. Are you having any GI issues? yes (Diarrhea) 2. Do you have a personal history of Polyps? no 3. Do you have a family history of Colon Cancer or Polyps? yes (Mother, colon cancer) 4. Diabetes Mellitus? no 5. Joint replacements in the past 12 months?no 6. 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 Plavix 75mg  Aspirin? Not daily  Patient confirms/reports the following medications:  Current Outpatient Prescriptions  Medication Sig Dispense Refill  . atorvastatin (LIPITOR) 20 MG tablet TAKE ONE (1) TABLET EACH DAY 30 tablet 5  . bisoprolol-hydrochlorothiazide (ZIAC) 2.5-6.25 MG tablet TAKE 1 TABLET BY MOUTH EVERY DAY FOR HIGH BLOOD PRESSURE 30 tablet 11  . butalbital-acetaminophen-caffeine (FIORICET, ESGIC) 50-325-40 MG per tablet TAKE ONE (1) TABLET EACH DAY AS NEEDED FOR HEADACHE 20 tablet 0  . citalopram (CELEXA) 40 MG tablet TAKE 1 TABLET BY MOUTH EVERY DAY. 30 tablet 3  . clonazePAM (KLONOPIN) 0.5 MG tablet Take 1 tablet (0.5 mg total) by mouth daily as needed. 30 tablet 1  . clopidogrel (PLAVIX) 75 MG tablet TAKE 1 TABLET BY MOUTH EVERY DAY. 90 tablet 1  . fluticasone (VERAMYST) 27.5 MCG/SPRAY nasal spray Place 2 sprays into the nose as needed.    . hyoscyamine (ANASPAZ) 0.125 MG TBDP disintergrating tablet Place 0.125 mg under the tongue every 4 (four) hours as needed.    . Loperamide HCl (IMODIUM PO) Take by mouth as needed.    . meloxicam (MOBIC) 7.5 MG tablet TAKE ONE TABLET TWICE  DAILY 60 tablet 5  . metoprolol succinate (TOPROL-XL) 25 MG 24 hr tablet TAKE ONE (1) TABLET EACH DAY 30 tablet 11  . Misc Natural Products (ALLERGY RELEAF SYSTEM PO) Take by mouth. 24 hour relief    . torsemide (DEMADEX) 20 MG tablet TAKE ONE (1) TABLET EACH DAY AS NEEDED 90 tablet 1  . WELCHOL 625 MG tablet Take 6 tablets by mouth  daily 540 tablet 3   No current facility-administered medications for this visit.    Patient confirms/reports the following allergies:  Allergies  Allergen Reactions  . Codeine Nausea Only  . Vicodin [Hydrocodone-Acetaminophen] Itching    No orders of the defined types were placed in this encounter.    AUTHORIZATION INFORMATION Primary Insurance: 1D#: Group #:  Secondary Insurance: 1D#: Group #:  SCHEDULE INFORMATION: Date:  Time: Location: [place]

## 2016-03-20 NOTE — Telephone Encounter (Signed)
Patient stated she saw Dr. Allen Norris in 2015. She is now having explosive diarrhea. Can you give her any suggestions and should I make her an appointment for the end of August?

## 2016-03-26 ENCOUNTER — Other Ambulatory Visit: Payer: Self-pay

## 2016-03-26 MED ORDER — NA SULFATE-K SULFATE-MG SULF 17.5-3.13-1.6 GM/177ML PO SOLN: 1.0000 | ORAL | 0 refills | Status: DC

## 2016-03-26 NOTE — Telephone Encounter (Signed)
Pt scheduled for a colonoscopy at [facility] on 03/27/16. Diarrhea R19.7. Please see if precert if required.

## 2016-03-27 ENCOUNTER — Ambulatory Visit: Payer: 59 | Admitting: Anesthesiology

## 2016-03-27 ENCOUNTER — Encounter
Admission: RE | Disposition: A | Discharge status: Home / Self Care | Payer: Self-pay | Source: Ambulatory Visit | Attending: Gastroenterology

## 2016-03-27 ENCOUNTER — Encounter: Payer: Self-pay | Admitting: *Deleted

## 2016-03-27 ENCOUNTER — Ambulatory Visit
Admission: RE | Admit: 2016-03-27 | Discharge: 2016-03-27 | Disposition: A | Discharge status: Home / Self Care | Payer: 59 | Source: Ambulatory Visit | Attending: Gastroenterology | Admitting: Gastroenterology

## 2016-03-27 DIAGNOSIS — Z85828 Personal history of other malignant neoplasm of skin: Secondary | ICD-10-CM | POA: Diagnosis not present

## 2016-03-27 DIAGNOSIS — Z801 Family history of malignant neoplasm of trachea, bronchus and lung: Secondary | ICD-10-CM | POA: Diagnosis not present

## 2016-03-27 DIAGNOSIS — E119 Type 2 diabetes mellitus without complications: Secondary | ICD-10-CM | POA: Insufficient documentation

## 2016-03-27 DIAGNOSIS — Z85528 Personal history of other malignant neoplasm of kidney: Secondary | ICD-10-CM | POA: Insufficient documentation

## 2016-03-27 DIAGNOSIS — K219 Gastro-esophageal reflux disease without esophagitis: Secondary | ICD-10-CM | POA: Diagnosis not present

## 2016-03-27 DIAGNOSIS — Z791 Long term (current) use of non-steroidal anti-inflammatories (NSAID): Secondary | ICD-10-CM | POA: Insufficient documentation

## 2016-03-27 DIAGNOSIS — E785 Hyperlipidemia, unspecified: Secondary | ICD-10-CM | POA: Insufficient documentation

## 2016-03-27 DIAGNOSIS — Z86010 Personal history of colonic polyps: Secondary | ICD-10-CM | POA: Diagnosis not present

## 2016-03-27 DIAGNOSIS — Z7902 Long term (current) use of antithrombotics/antiplatelets: Secondary | ICD-10-CM | POA: Diagnosis not present

## 2016-03-27 DIAGNOSIS — Z8673 Personal history of transient ischemic attack (TIA), and cerebral infarction without residual deficits: Secondary | ICD-10-CM | POA: Insufficient documentation

## 2016-03-27 DIAGNOSIS — Z87891 Personal history of nicotine dependence: Secondary | ICD-10-CM | POA: Insufficient documentation

## 2016-03-27 DIAGNOSIS — D125 Benign neoplasm of sigmoid colon: Secondary | ICD-10-CM | POA: Insufficient documentation

## 2016-03-27 DIAGNOSIS — F329 Major depressive disorder, single episode, unspecified: Secondary | ICD-10-CM | POA: Diagnosis not present

## 2016-03-27 DIAGNOSIS — Z905 Acquired absence of kidney: Secondary | ICD-10-CM | POA: Diagnosis not present

## 2016-03-27 DIAGNOSIS — I10 Essential (primary) hypertension: Secondary | ICD-10-CM | POA: Insufficient documentation

## 2016-03-27 DIAGNOSIS — Z79899 Other long term (current) drug therapy: Secondary | ICD-10-CM | POA: Insufficient documentation

## 2016-03-27 DIAGNOSIS — Z8261 Family history of arthritis: Secondary | ICD-10-CM | POA: Insufficient documentation

## 2016-03-27 DIAGNOSIS — Z8249 Family history of ischemic heart disease and other diseases of the circulatory system: Secondary | ICD-10-CM | POA: Insufficient documentation

## 2016-03-27 DIAGNOSIS — Z800 Family history of malignant neoplasm of digestive organs: Secondary | ICD-10-CM | POA: Insufficient documentation

## 2016-03-27 DIAGNOSIS — Z6831 Body mass index (BMI) 31.0-31.9, adult: Secondary | ICD-10-CM | POA: Diagnosis not present

## 2016-03-27 DIAGNOSIS — K529 Noninfective gastroenteritis and colitis, unspecified: Secondary | ICD-10-CM | POA: Insufficient documentation

## 2016-03-27 DIAGNOSIS — K6389 Other specified diseases of intestine: Secondary | ICD-10-CM | POA: Diagnosis not present

## 2016-03-27 DIAGNOSIS — E669 Obesity, unspecified: Secondary | ICD-10-CM | POA: Diagnosis not present

## 2016-03-27 DIAGNOSIS — Z885 Allergy status to narcotic agent status: Secondary | ICD-10-CM | POA: Diagnosis not present

## 2016-03-27 HISTORY — PX: COLONOSCOPY WITH PROPOFOL: SHX5780

## 2016-03-27 SURGERY — COLONOSCOPY WITH PROPOFOL
Anesthesia: General

## 2016-03-27 MED ORDER — SODIUM CHLORIDE 0.9 % IV SOLN: INTRAVENOUS | Status: DC

## 2016-03-27 MED ORDER — EPHEDRINE SULFATE 50 MG/ML IJ SOLN
INTRAMUSCULAR | Status: DC | PRN
Start: 2016-03-27 — End: 2016-03-27
  Administered 2016-03-27: 10 mg via INTRAVENOUS

## 2016-03-27 MED ORDER — MIDAZOLAM HCL 5 MG/5ML IJ SOLN
INTRAMUSCULAR | Status: DC | PRN
  Administered 2016-03-27: 1 mg via INTRAVENOUS

## 2016-03-27 MED ORDER — SODIUM CHLORIDE 0.9 % IV SOLN
INTRAVENOUS | Status: DC
Start: 2016-03-27 — End: 2016-03-27
  Administered 2016-03-27 (×2): via INTRAVENOUS

## 2016-03-27 MED ORDER — FENTANYL CITRATE (PF) 100 MCG/2ML IJ SOLN
INTRAMUSCULAR | Status: DC | PRN
  Administered 2016-03-27: 50 ug via INTRAVENOUS

## 2016-03-27 NOTE — Transfer of Care (Signed)
Immediate Anesthesia Transfer of Care Note  Patient: Jacqueline Nichols  Procedure(s) Performed: Procedure(s): COLONOSCOPY WITH PROPOFOL (N/A)  Patient Location: Endoscopy Unit  Anesthesia Type:General  Level of Consciousness: awake  Airway & Oxygen Therapy: Patient Spontanous Breathing and Patient connected to nasal cannula oxygen  Post-op Assessment: Report given to RN and Post -op Vital signs reviewed and stable  Post vital signs: Reviewed  Last Vitals:  Vitals:   03/27/16 1327 03/27/16 1337  BP: (!) 85/54 (!) 118/50  Pulse: (!) 52 (!) 53  Resp: 14 11  Temp: 36.4 C     Last Pain:  Vitals:   03/27/16 1327  TempSrc: Tympanic         Complications: No apparent anesthesia complications

## 2016-03-27 NOTE — Anesthesia Procedure Notes (Signed)
Performed by: COOK-MARTIN, Abbas Beyene Pre-anesthesia Checklist: Patient identified, Emergency Drugs available, Suction available, Patient being monitored and Timeout performed Patient Re-evaluated:Patient Re-evaluated prior to inductionOxygen Delivery Method: Nasal cannula Preoxygenation: Pre-oxygenation with 100% oxygen Intubation Type: IV induction Placement Confirmation: positive ETCO2 and CO2 detector       

## 2016-03-27 NOTE — Op Note (Addendum)
[facility] Gastroenterology Patient Name: Jacqueline Nichols Procedure Date: 03/27/2016 12:56 PM MRN: [id number] Account #: [id number] Date of Birth: 01/06/1956 Admit Type: Outpatient Age: 60 Room: [facility] ENDO ROOM 1 Gender: Female Note Status: Supervisor Override Procedure:            Colonoscopy Indications:          Chronic diarrhea Providers:            Lucilla Lame MD, MD Referring MD:         Einar Pheasant, MD (Referring MD) Complications:        No immediate complications. Procedure:            Pre-Anesthesia Assessment:                       - Prior to the procedure, a History and Physical was                        performed, and patient medications and allergies were                        reviewed. The patient's tolerance of previous                        anesthesia was also reviewed. The risks and benefits of                        the procedure and the sedation options and risks were                        discussed with the patient. All questions were                        answered, and informed consent was obtained. Prior                        Anticoagulants: The patient has taken no previous                        anticoagulant or antiplatelet agents. ASA Grade                        Assessment: II - A patient with mild systemic disease.                        After reviewing the risks and benefits, the patient was                        deemed in satisfactory condition to undergo the                        procedure.                       After obtaining informed consent, the colonoscope was                        passed under direct vision. Throughout the procedure,                        the patient's blood pressure, pulse, and  oxygen                        saturations were monitored continuously. The                        Colonoscope was introduced through the anus and                        advanced to the the terminal ileum. The colonoscopy was                         performed without difficulty. The patient tolerated the                        procedure well. The quality of the bowel preparation                        was good. Findings:      The perianal and digital rectal examinations were normal.      A 3 mm polyp was found in the sigmoid colon. The polyp was sessile. The       polyp was removed with a cold biopsy forceps. Resection and retrieval       were complete.      The terminal ileum appeared normal. Biopsies were taken with a cold       forceps for histology.      Random biopsies were obtained with cold forceps for histology randomly       in the entire colon. Impression:           - One 3 mm polyp in the sigmoid colon, removed with a                        cold biopsy forceps. Resected and retrieved.                       - The examined portion of the ileum was normal.                        Biopsied.                       - Random biopsies were obtained in the entire colon. Recommendation:       - Await pathology results. Procedure Code(s):    --- Professional ---                       [phone], Colonoscopy, flexible; with biopsy, single or                        multiple Diagnosis Code(s):    --- Professional ---                       K52.9, Noninfective gastroenteritis and colitis,                        unspecified                       D12.5, Benign neoplasm of sigmoid colon CPT copyright 2016 American Medical Association. All rights reserved. The codes documented in this report are  preliminary and upon coder review may  be revised to meet current compliance requirements. Lucilla Lame MD, MD 03/27/2016 1:16:44 PM This report has been signed electronically. Number of Addenda: 0 Note Initiated On: 03/27/2016 12:56 PM Scope Withdrawal Time: 0 hours 6 minutes 33 seconds  Total Procedure Duration: 0 hours 9 minutes 45 seconds       [facility]

## 2016-03-27 NOTE — Anesthesia Preprocedure Evaluation (Signed)
Anesthesia Evaluation  Patient identified by MRN, date of birth, ID band Patient awake    Reviewed: Allergy & Precautions, NPO status , Patient's Chart, lab work & pertinent test results, reviewed documented beta blocker date and time   History of Anesthesia Complications Negative for: history of anesthetic complications  Airway Mallampati: II  TM Distance: >3 FB Neck ROM: Full    Dental  (+) Upper Dentures, Partial Lower   Pulmonary neg shortness of breath, neg COPD, former smoker,    breath sounds clear to auscultation- rhonchi (-) wheezing      Cardiovascular Exercise Tolerance: Good hypertension, Pt. on medications (-) CAD and (-) Past MI  Rhythm:Regular Rate:Normal - Systolic murmurs and - Diastolic murmurs    Neuro/Psych Depression CVA (R sided weakness), Residual Symptoms    GI/Hepatic Neg liver ROS, GERD  ,  Endo/Other  diabetes  Renal/GU Renal disease (hx of renal carcinoma s/p nephrectomy)     Musculoskeletal negative musculoskeletal ROS (+)   Abdominal (+) + obese,   Peds  Hematology  (+) anemia ,   Anesthesia Other Findings Past Medical History: No date: Cancer ([place])     Comment: skin No date: Chicken pox No date: Colon polyps No date: Depression No date: Diabetes mellitus (HCC) No date: GERD (gastroesophageal reflux disease) No date: Hyperlipidemia No date: Hypertension No date: Stroke [facility])     Comment: brainstem No date: Urinary incontinence   Reproductive/Obstetrics                             Anesthesia Physical Anesthesia Plan  ASA: III  Anesthesia Plan: General   Post-op Pain Management:    Induction: Intravenous  Airway Management Planned: Natural Airway  Additional Equipment:   Intra-op Plan:   Post-operative Plan:   Informed Consent: I have reviewed the patients History and Physical, chart, labs and discussed the procedure including the risks,  benefits and alternatives for the proposed anesthesia with the patient or authorized representative who has indicated his/her understanding and acceptance.     Plan Discussed with: CRNA and Anesthesiologist  Anesthesia Plan Comments:         Anesthesia Quick Evaluation

## 2016-03-27 NOTE — H&P (Signed)
Lucilla Lame, MD [address] [place], [place] [address] Phone: [phone] Fax : [phone]  Primary Care Physician:  Einar Pheasant, MD Primary Gastroenterologist:  Dr. Allen Norris  Pre-Procedure History & Physical: HPI:  Jacqueline Nichols is a 60 y.o. female is here for an colonoscopy.   Past Medical History:  Diagnosis Date  . Cancer ([place])    skin  . Chicken pox   . Colon polyps   . Depression   . Diabetes mellitus ([place])   . GERD (gastroesophageal reflux disease)   . Hyperlipidemia   . Hypertension   . Stroke [facility])    brainstem  . Urinary incontinence     Past Surgical History:  Procedure Laterality Date  . CHOLECYSTECTOMY  2001  . KNEE SURGERY  2003-2009   s/p 8 knee surgeries (with 3 knee replacements)    Prior to Admission medications   Medication Sig Start Date End Date Taking? Authorizing Provider  sotalol (BETAPACE) 80 MG tablet Take by mouth. 01/15/16 01/14/17 Yes Historical Provider, MD  atorvastatin (LIPITOR) 20 MG tablet TAKE ONE (1) TABLET EACH DAY Patient not taking: Reported on 03/20/2016 11/08/13   Einar Pheasant, MD  bisoprolol-hydrochlorothiazide (ZIAC) 2.5-6.25 MG tablet TAKE 1 TABLET BY MOUTH EVERY DAY FOR HIGH BLOOD PRESSURE 10/19/15   Einar Pheasant, MD  butalbital-acetaminophen-caffeine (FIORICET, ESGIC) 50-325-40 MG per tablet TAKE ONE (1) TABLET EACH DAY AS NEEDED FOR HEADACHE 05/01/15   Einar Pheasant, MD  citalopram (CELEXA) 40 MG tablet TAKE 1 TABLET BY MOUTH EVERY DAY. 10/23/15   Einar Pheasant, MD  clonazePAM (KLONOPIN) 0.5 MG tablet Take 1 tablet (0.5 mg total) by mouth daily as needed. 10/25/15   Einar Pheasant, MD  clopidogrel (PLAVIX) 75 MG tablet TAKE 1 TABLET BY MOUTH EVERY DAY. 02/20/16   Einar Pheasant, MD  fluticasone (VERAMYST) 27.5 MCG/SPRAY nasal spray Place 2 sprays into the nose as needed. Reported on 03/20/2016    Historical Provider, MD  hyoscyamine (ANASPAZ) 0.125 MG TBDP disintergrating tablet Place 0.125 mg under the  tongue every 4 (four) hours as needed. Reported on 03/20/2016    Historical Provider, MD  Loperamide HCl (IMODIUM PO) Take by mouth as needed.    Historical Provider, MD  meloxicam (MOBIC) 7.5 MG tablet TAKE ONE TABLET TWICE DAILY 10/19/15   Einar Pheasant, MD  metoprolol succinate (TOPROL-XL) 25 MG 24 hr tablet TAKE ONE (1) TABLET EACH DAY 01/18/16   Einar Pheasant, MD  Misc Natural Products (ALLERGY RELEAF SYSTEM PO) Take by mouth. Reported on 03/20/2016    Historical Provider, MD  Na Sulfate-K Sulfate-Mg Sulf (SUPREP BOWEL PREP KIT) 17.5-3.13-1.6 GM/180ML SOLN Take 1 kit by mouth as directed. 03/26/16   Lucilla Lame, MD  torsemide (DEMADEX) 20 MG tablet TAKE ONE (1) TABLET EACH DAY AS NEEDED 02/01/13   Einar Pheasant, MD  [facility] 625 MG tablet Take 6 tablets by mouth  daily 05/10/15   Lucilla Lame, MD    Allergies as of 03/26/2016 - Review Complete 03/20/2016  Allergen Reaction Noted  . Codeine Nausea Only 10/21/2012  . Vicodin [hydrocodone-acetaminophen] Itching 11/20/2012    Family History  Problem Relation Age of Onset  . Colon cancer Mother     died - age 3  . Heart disease Father   . Hypertension Father   . Arthritis/Rheumatoid Sister   . Lung cancer Brother     smoked  . Arthritis/Rheumatoid Paternal Grandmother     Social History   Social History  . Marital status: Single  Spouse name: N/A  . Number of children: N/A  . Years of education: N/A   Occupational History  . Not on file.   Social History Main Topics  . Smoking status: Former Research scientist (life sciences)  . Smokeless tobacco: Never Used     Comment: Quit Feb 2014  . Alcohol use 0.0 oz/week     Comment: social drinking  . Drug use: No  . Sexual activity: Not on file   Other Topics Concern  . Not on file   Social History Narrative  . No narrative on file    Review of Systems: See HPI, otherwise negative ROS  Physical Exam: BP 129/69   Pulse (!) 56   Temp 97.9 F (36.6 C) (Oral)   Resp 17   Ht '5\' 10"'  (1.778 m)   Wt  220 lb (99.8 kg)   SpO2 97%   BMI 31.57 kg/m  General:   Alert,  pleasant and cooperative in NAD Head:  Normocephalic and atraumatic. Neck:  Supple; no masses or thyromegaly. Lungs:  Clear throughout to auscultation.    Heart:  Regular rate and rhythm. Abdomen:  Soft, nontender and nondistended. Normal bowel sounds, without guarding, and without rebound.   Neurologic:  Alert and  oriented x4;  grossly normal neurologically.  Impression/Plan: Jacqueline Nichols is here for an colonoscopy to be performed for diarrhea  Risks, benefits, limitations, and alternatives regarding  colonoscopy have been reviewed with the patient.  Questions have been answered.  All parties agreeable.   Lucilla Lame, MD  03/27/2016, 1:18 PM

## 2016-03-27 NOTE — Anesthesia Postprocedure Evaluation (Signed)
Anesthesia Post Note  Patient: Jacqueline Nichols  Procedure(s) Performed: Procedure(s) (LRB): COLONOSCOPY WITH PROPOFOL (N/A)  Patient location during evaluation: PACU Anesthesia Type: General Level of consciousness: awake and alert and oriented Pain management: pain level controlled Vital Signs Assessment: post-procedure vital signs reviewed and stable Respiratory status: spontaneous breathing, nonlabored ventilation and respiratory function stable Cardiovascular status: blood pressure returned to baseline and stable Postop Assessment: no signs of nausea or vomiting Anesthetic complications: no    Last Vitals:  Vitals:   03/27/16 1347 03/27/16 1357  BP: 118/62 116/69  Pulse: (!) 50 (!) 51  Resp: 12 13  Temp:      Last Pain:  Vitals:   03/27/16 1327  TempSrc: Tympanic                 [name]

## 2016-03-28 ENCOUNTER — Encounter: Payer: Self-pay | Admitting: Gastroenterology

## 2016-03-28 LAB — SURGICAL PATHOLOGY

## 2016-04-04 ENCOUNTER — Telehealth: Payer: Self-pay

## 2016-04-04 NOTE — Telephone Encounter (Signed)
-----   Message from Lucilla Lame, MD sent at 04/03/2016  8:06 AM EDT ----- Let the patient know that all the biopsies of the terminal ileum and colon biopsies were all normal. There is no sign of any inflammation or cause for any diarrhea. If the patient is continuing to have GI problems please have the patient follow-up in the office.

## 2016-04-04 NOTE — Telephone Encounter (Signed)
Pt notified of colonoscopy results. Pt still having diarrhea and has scheduled a follow up appt on 05/08/16.

## 2016-05-02 ENCOUNTER — Other Ambulatory Visit: Payer: Self-pay

## 2016-05-02 MED ORDER — CLONAZEPAM 0.5 MG PO TABS: 0.5000 mg | ORAL_TABLET | Freq: Every day | ORAL | 0 refills | Status: DC | PRN

## 2016-05-02 NOTE — Telephone Encounter (Signed)
Rx faxed to [facility] 

## 2016-05-02 NOTE — Telephone Encounter (Signed)
Last refill was 10/25/2015. Next OV 07/01/2016. Renaldo Fiddler, CMA

## 2016-05-02 NOTE — Telephone Encounter (Signed)
ok'd clonazepam #30 with no refills.   

## 2016-05-08 ENCOUNTER — Other Ambulatory Visit: Payer: Self-pay

## 2016-05-08 ENCOUNTER — Telehealth: Payer: Self-pay | Admitting: Gastroenterology

## 2016-05-08 ENCOUNTER — Ambulatory Visit (INDEPENDENT_AMBULATORY_CARE_PROVIDER_SITE_OTHER): Payer: 59 | Admitting: Gastroenterology

## 2016-05-08 ENCOUNTER — Encounter: Payer: Self-pay | Admitting: Gastroenterology

## 2016-05-08 VITALS — BP 126/78 | HR 66 | Temp 97.8°F | Ht 69.5 in | Wt 225.0 lb

## 2016-05-08 DIAGNOSIS — R197 Diarrhea, unspecified: Secondary | ICD-10-CM | POA: Diagnosis not present

## 2016-05-08 DIAGNOSIS — F320 Major depressive disorder, single episode, mild: Secondary | ICD-10-CM | POA: Insufficient documentation

## 2016-05-08 DIAGNOSIS — F32A Depression, unspecified: Secondary | ICD-10-CM | POA: Insufficient documentation

## 2016-05-08 DIAGNOSIS — F329 Major depressive disorder, single episode, unspecified: Secondary | ICD-10-CM | POA: Insufficient documentation

## 2016-05-08 DIAGNOSIS — F321 Major depressive disorder, single episode, moderate: Secondary | ICD-10-CM | POA: Insufficient documentation

## 2016-05-08 MED ORDER — DIPHENOXYLATE-ATROPINE 2.5-0.025 MG PO TABS: 1.0000 | ORAL_TABLET | Freq: Four times a day (QID) | ORAL | 5 refills | Status: DC | PRN

## 2016-05-08 NOTE — Telephone Encounter (Signed)
Patient called and has a question regarding her stool sample test.

## 2016-05-08 NOTE — Progress Notes (Signed)
Primary Care Physician: Einar Pheasant, MD  Primary Gastroenterologist:  Dr. Lucilla Lame  Chief Complaint  Patient presents with  . Follow up diarrhea    HPI: Jacqueline Nichols is a 60 y.o. female here for follow-up of her diarrhea. The patient has had a workup by Dr. Candace Cruise in the past. He had done 2 colonoscopies which did not show any cause for her diarrhea. The patient recently underwent another colonoscopy by me with iron sees throughout the exam that showed normal tissue. The patient states that she was doing well with her 2 bowel movements a day that she reports to be soft until Sunday of this week where she started to have profuse diarrhea after she ate oyster stew set a night. The patient reports that she has not had a bowel movement after her initial episode of diarrhea this morning. The patient reports that the diarrhea is worse with stress. She also takes Imodium every day and has take in [facility] in the past but states it has not helped her.  Current Outpatient Prescriptions  Medication Sig Dispense Refill  . bisoprolol-hydrochlorothiazide (ZIAC) 2.5-6.25 MG tablet TAKE 1 TABLET BY MOUTH EVERY DAY FOR HIGH BLOOD PRESSURE 30 tablet 11  . butalbital-acetaminophen-caffeine (FIORICET, ESGIC) 50-325-40 MG per tablet TAKE ONE (1) TABLET EACH DAY AS NEEDED FOR HEADACHE 20 tablet 0  . citalopram (CELEXA) 40 MG tablet TAKE 1 TABLET BY MOUTH EVERY DAY. 30 tablet 3  . clonazePAM (KLONOPIN) 0.5 MG tablet Take 1 tablet (0.5 mg total) by mouth daily as needed. 30 tablet 0  . clopidogrel (PLAVIX) 75 MG tablet TAKE 1 TABLET BY MOUTH EVERY DAY. 90 tablet 1  . hyoscyamine (ANASPAZ) 0.125 MG TBDP disintergrating tablet Place 0.125 mg under the tongue every 4 (four) hours as needed. Reported on 03/20/2016    . Loperamide HCl (IMODIUM PO) Take by mouth as needed.    . meloxicam (MOBIC) 7.5 MG tablet TAKE ONE TABLET TWICE DAILY 60 tablet 5  . Misc Natural Products (ALLERGY RELEAF SYSTEM PO) Take by mouth.  Reported on 03/20/2016    . torsemide (DEMADEX) 20 MG tablet TAKE ONE (1) TABLET EACH DAY AS NEEDED 90 tablet 1  . atorvastatin (LIPITOR) 20 MG tablet TAKE ONE (1) TABLET EACH DAY (Patient not taking: Reported on 03/20/2016) 30 tablet 5  . diphenoxylate-atropine (LOMOTIL) 2.5-0.025 MG tablet Take 1 tablet by mouth 4 (four) times daily as needed for diarrhea or loose stools. 90 tablet 5  . fluticasone (VERAMYST) 27.5 MCG/SPRAY nasal spray Place 2 sprays into the nose as needed. Reported on 03/20/2016    . metoprolol succinate (TOPROL-XL) 25 MG 24 hr tablet TAKE ONE (1) TABLET EACH DAY (Patient not taking: Reported on 05/08/2016) 30 tablet 11  . sotalol (BETAPACE) 80 MG tablet Take by mouth.    Earnestine Mealing 625 MG tablet Take 6 tablets by mouth  daily (Patient not taking: Reported on 05/08/2016) 540 tablet 3   No current facility-administered medications for this visit.     Allergies as of 05/08/2016 - Review Complete 05/08/2016  Allergen Reaction Noted  . Codeine Nausea Only 10/21/2012  . Vicodin [hydrocodone-acetaminophen] Itching 11/20/2012    ROS:  General: Negative for anorexia, weight loss, fever, chills, fatigue, weakness. ENT: Negative for hoarseness, difficulty swallowing , nasal congestion. CV: Negative for chest pain, angina, palpitations, dyspnea on exertion, peripheral edema.  Respiratory: Negative for dyspnea at rest, dyspnea on exertion, cough, sputum, wheezing.  GI: See history of present illness. GU:  Negative  for dysuria, hematuria, urinary incontinence, urinary frequency, nocturnal urination.  Endo: Negative for unusual weight change.    Physical Examination:   BP 126/78   Pulse 66   Temp 97.8 F (36.6 C) (Oral)   Ht 5' 9.5" (1.765 m)   Wt 225 lb (102.1 kg)   BMI 32.75 kg/m   General: Well-nourished, well-developed in no acute distress.  Eyes: No icterus. Conjunctivae pink. Mouth: Oropharyngeal mucosa moist and pink , no lesions erythema or exudate. Lungs: Clear to  auscultation bilaterally. Non-labored. Heart: Regular rate and rhythm, no murmurs rubs or gallops.  Abdomen: Bowel sounds are normal, nontender, nondistended, no hepatosplenomegaly or masses, no abdominal bruits or hernia , no rebound or guarding.   Extremities: No lower extremity edema. No clubbing or deformities. Neuro: Alert and oriented x 3.  Grossly intact. Skin: Warm and dry, no jaundice.   Psych: Alert and cooperative, normal mood and affect.  Labs:    Imaging Studies: No results found.  Assessment and Plan:   [name] is a 60 y.o. y/o female who has chronic diarrhea with a workup in the past by another gastrologist with 2 colonoscopies that were negative. The patient recently had a colonoscopy by me with multiple biopsies that were normal. 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 The patient likely had a recent infection as the cause of her exacerbation. The patient will have her stool sent off for possible pathogens. The patient will also be started on Lomotil for her diarrhea. The patient has been explained the plan and agrees with it.   Note: This dictation was prepared with Dragon dictation along with smaller phrase technology. Any transcriptional errors that result from this process are unintentional.

## 2016-05-09 NOTE — Telephone Encounter (Signed)
Questions answered regarding stool test.

## 2016-05-13 NOTE — Telephone Encounter (Signed)
Patient left a voice message that she would like to talk to you before you fax in the paperwork to her job.

## 2016-05-13 NOTE — Telephone Encounter (Signed)
Pt had updated information to provide me for her disability paperwork. Updated info and faxed to [facility].

## 2016-05-13 NOTE — Telephone Encounter (Signed)
Please call her at [phone] desk phone

## 2016-05-16 LAB — GI PROFILE, STOOL, PCR
ADENOVIRUS F 40/41: NOT DETECTED
Astrovirus: NOT DETECTED
C difficile toxin A/B: NOT DETECTED
CRYPTOSPORIDIUM: NOT DETECTED
CYCLOSPORA CAYETANENSIS: NOT DETECTED
Campylobacter: NOT DETECTED
ENTEROAGGREGATIVE E COLI: NOT DETECTED
Entamoeba histolytica: NOT DETECTED
Enteropathogenic E coli: NOT DETECTED
Enterotoxigenic E coli: NOT DETECTED
GIARDIA LAMBLIA: NOT DETECTED
Norovirus GI/GII: NOT DETECTED
Plesiomonas shigelloides: NOT DETECTED
Rotavirus A: NOT DETECTED
SALMONELLA: NOT DETECTED
Sapovirus: NOT DETECTED
Shiga-toxin-producing E coli: NOT DETECTED
Shigella/Enteroinvasive E coli: NOT DETECTED
VIBRIO CHOLERAE: NOT DETECTED
VIBRIO: NOT DETECTED
YERSINIA ENTEROCOLITICA: NOT DETECTED

## 2016-05-17 ENCOUNTER — Telehealth: Payer: Self-pay

## 2016-05-17 NOTE — Telephone Encounter (Signed)
-----   Message from Lucilla Lame, MD sent at 05/16/2016  7:18 PM EDT ----- Let the patient know that all of the stool tests that were sent off were negative for any infection.

## 2016-05-17 NOTE — Telephone Encounter (Signed)
Pt notified of stool culture results.

## 2016-05-21 DIAGNOSIS — Z8719 Personal history of other diseases of the digestive system: Secondary | ICD-10-CM | POA: Insufficient documentation

## 2016-06-17 ENCOUNTER — Ambulatory Visit (INDEPENDENT_AMBULATORY_CARE_PROVIDER_SITE_OTHER): Payer: 59 | Admitting: Internal Medicine

## 2016-06-17 ENCOUNTER — Encounter: Payer: Self-pay | Admitting: Internal Medicine

## 2016-06-17 VITALS — BP 108/78 | HR 55 | Temp 99.0°F | Ht 70.0 in | Wt 230.6 lb

## 2016-06-17 DIAGNOSIS — E7800 Pure hypercholesterolemia, unspecified: Secondary | ICD-10-CM

## 2016-06-17 DIAGNOSIS — Z8679 Personal history of other diseases of the circulatory system: Secondary | ICD-10-CM

## 2016-06-17 DIAGNOSIS — Z0000 Encounter for general adult medical examination without abnormal findings: Secondary | ICD-10-CM

## 2016-06-17 DIAGNOSIS — F329 Major depressive disorder, single episode, unspecified: Secondary | ICD-10-CM

## 2016-06-17 DIAGNOSIS — Z1239 Encounter for other screening for malignant neoplasm of breast: Secondary | ICD-10-CM

## 2016-06-17 DIAGNOSIS — D649 Anemia, unspecified: Secondary | ICD-10-CM

## 2016-06-17 DIAGNOSIS — R748 Abnormal levels of other serum enzymes: Secondary | ICD-10-CM

## 2016-06-17 DIAGNOSIS — Z23 Encounter for immunization: Secondary | ICD-10-CM

## 2016-06-17 DIAGNOSIS — Z1231 Encounter for screening mammogram for malignant neoplasm of breast: Secondary | ICD-10-CM | POA: Diagnosis not present

## 2016-06-17 DIAGNOSIS — K529 Noninfective gastroenteritis and colitis, unspecified: Secondary | ICD-10-CM | POA: Diagnosis not present

## 2016-06-17 DIAGNOSIS — Z0289 Encounter for other administrative examinations: Secondary | ICD-10-CM

## 2016-06-17 DIAGNOSIS — F32A Depression, unspecified: Secondary | ICD-10-CM

## 2016-06-17 DIAGNOSIS — C642 Malignant neoplasm of left kidney, except renal pelvis: Secondary | ICD-10-CM | POA: Diagnosis not present

## 2016-06-17 DIAGNOSIS — I10 Essential (primary) hypertension: Secondary | ICD-10-CM

## 2016-06-17 DIAGNOSIS — E119 Type 2 diabetes mellitus without complications: Secondary | ICD-10-CM

## 2016-06-17 MED ORDER — ROSUVASTATIN CALCIUM 5 MG PO TABS: 5.0000 mg | ORAL_TABLET | Freq: Every day | ORAL | 2 refills | Status: DC

## 2016-06-17 NOTE — Progress Notes (Signed)
Patient ID: Jacqueline Nichols, female   DOB: [birth year], 58 y.o.   MRN: [id number]   Subjective:    Patient ID: Jacqueline Nichols, female    DOB: 1956/06/24, 60 y.o.   MRN: [id number]  HPI  Patient here for a scheduled follow up.  She is s/p ablation.  Heart doing better.  Feels better.  Will f/u with Dr Clayborn Bigness.  No sob.  No chest pain.  No increased acid reflux.  No abdominal pain.  Saw Dr Allen Norris.  Was placed on lamotil for her diarrhea.  Has helped.  Seeing ortho for her knee.  Saw Dr [name] in 04/2016.  Felt stable.  Recommended f/u in 6 months.  Discussed lab results.  Trying crestor for her cholesterol.  May tolerate better.  Overall she feels things are stable.     Past Medical History:  Diagnosis Date  . Cancer ([place])    skin  . Chicken pox   . Colon polyps   . Depression   . Diabetes mellitus ([place])   . GERD (gastroesophageal reflux disease)   . Hyperlipidemia   . Hypertension   . 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 Urinary incontinence    Past Surgical History:  Procedure Laterality Date  . CHOLECYSTECTOMY  2001  . COLONOSCOPY WITH PROPOFOL N/A 03/27/2016   Procedure: COLONOSCOPY WITH PROPOFOL;  Surgeon: Lucilla Lame, MD;  Location: [facility] ENDOSCOPY;  Service: Endoscopy;  Laterality: N/A;  . KNEE SURGERY  2003-2009   s/p 8 knee surgeries (with 3 knee replacements)   Family History  Problem Relation Age of Onset  . Colon cancer Mother     died - age 64  . Heart disease Father   . Hypertension Father   . Arthritis/Rheumatoid Sister   . Lung cancer Brother     smoked  . Arthritis/Rheumatoid Paternal Grandmother    Social History   Social History  . Marital status: Single    Spouse name: N/A  . Number of children: N/A  . Years of education: N/A   Social History Main Topics  . Smoking status: Former Research scientist (life sciences)  . Smokeless tobacco: Never Used     Comment: Quit Feb 2014  . Alcohol use 0.0 oz/week     Comment: social drinking  . Drug use: No  . Sexual activity: Not Asked   Other  Topics Concern  . None   Social History Narrative  . None    Outpatient Encounter Prescriptions as of 06/17/2016  Medication Sig  . bisoprolol-hydrochlorothiazide (ZIAC) 2.5-6.25 MG tablet TAKE 1 TABLET BY MOUTH EVERY DAY FOR HIGH BLOOD PRESSURE  . butalbital-acetaminophen-caffeine (FIORICET, ESGIC) 50-325-40 MG per tablet TAKE ONE (1) TABLET EACH DAY AS NEEDED FOR HEADACHE  . citalopram (CELEXA) 40 MG tablet TAKE 1 TABLET BY MOUTH EVERY DAY.  . clonazePAM (KLONOPIN) 0.5 MG tablet Take 1 tablet (0.5 mg total) by mouth daily as needed.  . clopidogrel (PLAVIX) 75 MG tablet TAKE 1 TABLET BY MOUTH EVERY DAY.  . diphenoxylate-atropine (LOMOTIL) 2.5-0.025 MG tablet Take 1 tablet by mouth 4 (four) times daily as needed for diarrhea or loose stools.  . fluticasone (VERAMYST) 27.5 MCG/SPRAY nasal spray Place 2 sprays into the nose as needed. Reported on 03/20/2016  . hyoscyamine (ANASPAZ) 0.125 MG TBDP disintergrating tablet Place 0.125 mg under the tongue every 4 (four) hours as needed. Reported on 03/20/2016  . Loperamide HCl (IMODIUM PO) Take by mouth as needed.  . meloxicam (MOBIC) 7.5 MG tablet TAKE ONE TABLET TWICE  DAILY  . Misc Natural Products (ALLERGY RELEAF SYSTEM PO) Take by mouth. Reported on 03/20/2016  . torsemide (DEMADEX) 20 MG tablet TAKE ONE (1) TABLET EACH DAY AS NEEDED  . [DISCONTINUED] atorvastatin (LIPITOR) 20 MG tablet TAKE ONE (1) TABLET EACH DAY  . [DISCONTINUED] metoprolol succinate (TOPROL-XL) 25 MG 24 hr tablet TAKE ONE (1) TABLET EACH DAY  . [DISCONTINUED] sotalol (BETAPACE) 80 MG tablet Take by mouth.  . [DISCONTINUED] WELCHOL 625 MG tablet Take 6 tablets by mouth  daily  . rosuvastatin (CRESTOR) 5 MG tablet Take 1 tablet (5 mg total) by mouth daily.   No facility-administered encounter medications on file as of 06/17/2016.     Review of Systems  Constitutional: Negative for appetite change and unexpected weight change.  HENT: Negative for congestion and sinus  pressure.   Eyes: Negative for pain and visual disturbance.  Respiratory: Negative for cough, chest tightness and shortness of breath.   Cardiovascular: Negative for chest pain, palpitations and leg swelling.  Gastrointestinal: Negative for abdominal pain, diarrhea, nausea and vomiting.  Genitourinary: Negative for difficulty urinating and dysuria.  Musculoskeletal: Negative for back pain and joint swelling.  Skin: Negative for color change and rash.  Neurological: Negative for dizziness, light-headedness and headaches.  Hematological: Negative for adenopathy. Does not bruise/bleed easily.  Psychiatric/Behavioral: Negative for agitation and dysphoric mood.       Objective:    Physical Exam  Constitutional: She appears well-developed and well-nourished. No distress.  HENT:  Nose: Nose normal.  Mouth/Throat: Oropharynx is clear and moist.  Neck: Neck supple. No thyromegaly present.  Cardiovascular: Normal rate and regular rhythm.   Pulmonary/Chest: Breath sounds normal. No respiratory distress. She has no wheezes.  Abdominal: Soft. Bowel sounds are normal. There is no tenderness.  Musculoskeletal: She exhibits no edema or tenderness.  Lymphadenopathy:    She has no cervical adenopathy.  Skin: No rash noted. No erythema.  Psychiatric: She has a normal mood and affect. Her behavior is normal.    BP 108/78   Pulse (!) 55   Temp 99 F (37.2 C) (Oral)   Ht _0  (1.778 m)   Wt 230 lb 9.6 oz (104.6 kg)   SpO2 96%   BMI 33.09 kg/m  Wt Readings from Last 3 Encounters:  06/17/16 230 lb 9.6 oz (104.6 kg)  05/08/16 225 lb (102.1 kg)  03/27/16 220 lb (99.8 kg)     Lab Results  Component Value Date   WBC 4.8 01/04/2016   HGB 13.9 01/04/2016   HCT 41.0 01/04/2016   PLT 189 01/04/2016   GLUCOSE 104 (H) 01/04/2016   CHOL 267 (H) 10/30/2015   TRIG 199 (H) 10/30/2015   HDL 53 10/30/2015   LDLCALC 174 (H) 10/30/2015   ALT 19 01/04/2016   AST 23 01/04/2016   NA 136 01/04/2016     K 3.2 (L) 01/04/2016   CL 103 01/04/2016   CREATININE 1.09 (H) 01/04/2016   BUN 28 (H) 01/04/2016   CO2 25 01/04/2016   TSH 1.52 04/12/2015   HGBA1C 5.6 10/30/2015       Assessment & Plan:   Problem List Items Addressed This Visit    Anemia    Follow cbc.       Depression    On celexa.  Stable.       Diabetes mellitus ([place])    Low carb diet and exercise.  Follow met b and a1c.  Stay up to date with eye checks.  Relevant Medications   rosuvastatin (CRESTOR) 5 MG tablet   Other Relevant Orders   Hemoglobin A1c   Microalbumin / creatinine urine ratio   Elevated alkaline phosphatase level    Recheck liver panel with next labs.       H/O supraventricular tachycardia    S/p ablation.  Continue f/u with cardiology.  Doing well.       Health care maintenance     Needs mammogram scheduled.  Colonoscopy 03/27/16- one 13m polyp in the sigmoid.        Hypercholesteremia    Reviewed her cholesterol results from her wellness screening.  LDL 154.  Give her a trial of low dose crestor to see if tolerates.  Increased dose as tolerated.        Relevant Medications   rosuvastatin (CRESTOR) 5 MG tablet   Other Relevant Orders   Hepatic function panel   Lipid panel   Hypertension    Blood pressure under good control.  Continue same medication regimen.  Follow pressures.  Follow metabolic panel.        Relevant Medications   rosuvastatin (CRESTOR) 5 MG tablet   Other Relevant Orders   Basic metabolic panel   TSH   Noninfectious diarrhea    Saw Dr WAllen Norris  On lamotil.  Better.  Follow.       Renal cell carcinoma (HCC)    S/p left radical nephrectomy and partial adrenalectomy.  Followed by urology.  Evaluated by Dr CJacqlyn Larsen8/2017.  Recommended 6 month f/u.        Other Visit Diagnoses    Breast cancer screening    -  Primary   Relevant Orders   MM DIGITAL SCREENING BILATERAL   Encounter for immunization       Relevant Orders   Flu Vaccine QUAD 36+ mos IM  (Completed)   Hemoglobin A1c   Hepatic function panel   Lipid panel   TSH   Microalbumin / creatinine urine ratio   Encounter for completion of form with patient       form for work completed.         SEinar Pheasant MD

## 2016-06-17 NOTE — Progress Notes (Signed)
Pre visit review using our clinic review tool, if applicable. No additional management support is needed unless otherwise documented below in the visit note. 

## 2016-06-23 ENCOUNTER — Encounter: Payer: Self-pay | Admitting: Internal Medicine

## 2016-06-23 NOTE — Assessment & Plan Note (Signed)
Saw Dr Allen Norris.  On lamotil.  Better.  Follow.

## 2016-06-23 NOTE — Assessment & Plan Note (Signed)
Recheck liver panel with next labs.   

## 2016-06-23 NOTE — Assessment & Plan Note (Signed)
Reviewed her cholesterol results from her wellness screening.  LDL 154.  Give her a trial of low dose crestor to see if tolerates.  Increased dose as tolerated.

## 2016-06-23 NOTE — Assessment & Plan Note (Signed)
Low carb diet and exercise.  Follow met b and a1c.  Stay up to date with eye checks.

## 2016-06-23 NOTE — Assessment & Plan Note (Signed)
Follow cbc.  

## 2016-06-23 NOTE — Assessment & Plan Note (Addendum)
Needs mammogram scheduled.  Colonoscopy 03/27/16- one 63mm polyp in the sigmoid.

## 2016-06-23 NOTE — Assessment & Plan Note (Signed)
Blood pressure under good control.  Continue same medication regimen.  Follow pressures.  Follow metabolic panel.   

## 2016-06-23 NOTE — Assessment & Plan Note (Signed)
S/p ablation.  Continue f/u with cardiology.  Doing well.  

## 2016-06-23 NOTE — Assessment & Plan Note (Signed)
S/p left radical nephrectomy and partial adrenalectomy.  Followed by urology.  Evaluated by Dr [name] 04/2016.  Recommended 6 month f/u.

## 2016-06-23 NOTE — Assessment & Plan Note (Signed)
On celexa.  Stable.

## 2016-06-25 ENCOUNTER — Other Ambulatory Visit: Payer: Self-pay

## 2016-06-25 ENCOUNTER — Other Ambulatory Visit: Payer: Self-pay | Admitting: Internal Medicine

## 2016-06-25 NOTE — Progress Notes (Signed)
Last filled 05/02/16 30 0rf   Addendum.  Ok's refill for clonazepam #30 with no refills.   Dr Nicki Reaper

## 2016-06-26 MED ORDER — CLONAZEPAM 0.5 MG PO TABS: 0.5000 mg | ORAL_TABLET | Freq: Every day | ORAL | 0 refills | Status: DC | PRN

## 2016-06-26 NOTE — Telephone Encounter (Signed)
Faxed to pharmacy

## 2016-07-01 ENCOUNTER — Telehealth: Payer: Self-pay | Admitting: Gastroenterology

## 2016-07-01 ENCOUNTER — Ambulatory Visit: Payer: 59 | Admitting: Internal Medicine

## 2016-07-01 NOTE — Telephone Encounter (Signed)
Rx changed to #120 on Lomotil. Changed with Anderson Malta at [facility]. Pt aware.

## 2016-07-01 NOTE — Telephone Encounter (Signed)
Please call Anderson Malta from [facility] were suppose to change her Lomotil from 90 to 120

## 2016-08-03 ENCOUNTER — Encounter: Payer: Self-pay | Admitting: Internal Medicine

## 2016-08-03 LAB — BASIC METABOLIC PANEL
BUN / CREAT RATIO: 17 (ref 9–23)
BUN: 18 mg/dL (ref 6–24)
CALCIUM: 9.4 mg/dL (ref 8.7–10.2)
CO2: 29 mmol/L (ref 18–29)
Chloride: 95 mmol/L — ABNORMAL LOW (ref 96–106)
Creatinine, Ser: 1.04 mg/dL — ABNORMAL HIGH (ref 0.57–1.00)
GFR, EST AFRICAN AMERICAN: 68 mL/min/{1.73_m2} (ref >59)
GFR, EST NON AFRICAN AMERICAN: 59 mL/min/{1.73_m2} — AB (ref >59)
Glucose: 84 mg/dL (ref 65–99)
Potassium: 4.2 mmol/L (ref 3.5–5.2)
Sodium: 138 mmol/L (ref 134–144)

## 2016-08-03 LAB — MICROALBUMIN / CREATININE URINE RATIO: CREATININE, UR: 119.5 mg/dL

## 2016-08-03 LAB — HEPATIC FUNCTION PANEL
ALBUMIN: 4.2 g/dL (ref 3.5–5.5)
ALK PHOS: 116 IU/L (ref 39–117)
ALT: 29 IU/L (ref 0–32)
AST: 23 IU/L (ref 0–40)
BILIRUBIN TOTAL: 0.6 mg/dL (ref 0.0–1.2)
BILIRUBIN, DIRECT: 0.14 mg/dL (ref 0.00–0.40)
TOTAL PROTEIN: 6.3 g/dL (ref 6.0–8.5)

## 2016-08-03 LAB — LIPID PANEL
CHOL/HDL RATIO: 4.3 ratio (ref 0.0–4.4)
Cholesterol, Total: 172 mg/dL (ref 100–199)
HDL: 40 mg/dL (ref >39)
LDL CALC: 98 mg/dL (ref 0–99)
Triglycerides: 170 mg/dL — ABNORMAL HIGH (ref 0–149)
VLDL Cholesterol Cal: 34 mg/dL (ref 5–40)

## 2016-08-03 LAB — HEMOGLOBIN A1C
Est. average glucose Bld gHb Est-mCnc: 108 mg/dL
Hgb A1c MFr Bld: 5.4 % (ref 4.8–5.6)

## 2016-08-03 LAB — TSH: TSH: 1.6 u[IU]/mL (ref 0.450–4.500)

## 2016-08-09 ENCOUNTER — Other Ambulatory Visit: Payer: Self-pay | Admitting: Internal Medicine

## 2016-08-09 NOTE — Telephone Encounter (Signed)
Ok'd rx for clonazepam #30 with no refills.

## 2016-08-09 NOTE — Telephone Encounter (Signed)
Last filled 06/26/2016 30 0rf

## 2016-10-18 ENCOUNTER — Encounter: Payer: 59 | Admitting: Internal Medicine

## 2016-10-23 DIAGNOSIS — N2889 Other specified disorders of kidney and ureter: Secondary | ICD-10-CM | POA: Diagnosis not present

## 2016-10-23 DIAGNOSIS — Z905 Acquired absence of kidney: Secondary | ICD-10-CM | POA: Diagnosis not present

## 2016-10-23 DIAGNOSIS — C642 Malignant neoplasm of left kidney, except renal pelvis: Secondary | ICD-10-CM | POA: Diagnosis not present

## 2016-10-31 ENCOUNTER — Other Ambulatory Visit: Payer: Self-pay | Admitting: Internal Medicine

## 2016-11-08 ENCOUNTER — Other Ambulatory Visit: Payer: Self-pay | Admitting: Internal Medicine

## 2016-11-14 ENCOUNTER — Other Ambulatory Visit: Payer: Self-pay | Admitting: Internal Medicine

## 2016-11-14 NOTE — Telephone Encounter (Signed)
Medication: Klonopin 0.5mg   Directions: 1 po qd prn  Last given: 10/31/16 #30 Number refills: 1 Last o/v: 06/17/16 Follow up: 12/20/16 Labs: 08/02/16

## 2016-11-15 NOTE — Telephone Encounter (Signed)
Pharmacy did not have I called in verbal

## 2016-11-15 NOTE — Telephone Encounter (Signed)
Need to clarify rx with pharmacy.  Per our records rx sent in on 10/31/16 for #30 with one refill.

## 2016-12-03 ENCOUNTER — Telehealth: Payer: Self-pay | Admitting: Gastroenterology

## 2016-12-03 NOTE — Telephone Encounter (Signed)
Regarding paper - paperwork is incomplete

## 2016-12-04 NOTE — Telephone Encounter (Signed)
Pt notified paperwork will be completed and refaxed.

## 2016-12-20 ENCOUNTER — Ambulatory Visit (INDEPENDENT_AMBULATORY_CARE_PROVIDER_SITE_OTHER): Payer: 59 | Admitting: Internal Medicine

## 2016-12-20 ENCOUNTER — Encounter: Payer: Self-pay | Admitting: Internal Medicine

## 2016-12-20 ENCOUNTER — Other Ambulatory Visit (HOSPITAL_COMMUNITY)
Admission: RE | Admit: 2016-12-20 | Discharge: 2016-12-20 | Disposition: A | Discharge status: Home / Self Care | Payer: 59 | Source: Ambulatory Visit | Attending: Internal Medicine | Admitting: Internal Medicine

## 2016-12-20 VITALS — BP 110/70 | HR 52 | Temp 98.7°F | Resp 12 | Ht 70.08 in | Wt 236.2 lb

## 2016-12-20 DIAGNOSIS — Z124 Encounter for screening for malignant neoplasm of cervix: Secondary | ICD-10-CM

## 2016-12-20 DIAGNOSIS — Z0000 Encounter for general adult medical examination without abnormal findings: Secondary | ICD-10-CM | POA: Diagnosis not present

## 2016-12-20 DIAGNOSIS — C642 Malignant neoplasm of left kidney, except renal pelvis: Secondary | ICD-10-CM | POA: Diagnosis not present

## 2016-12-20 DIAGNOSIS — R000 Tachycardia, unspecified: Secondary | ICD-10-CM | POA: Diagnosis not present

## 2016-12-20 DIAGNOSIS — E7800 Pure hypercholesterolemia, unspecified: Secondary | ICD-10-CM | POA: Diagnosis not present

## 2016-12-20 DIAGNOSIS — F329 Major depressive disorder, single episode, unspecified: Secondary | ICD-10-CM

## 2016-12-20 DIAGNOSIS — I10 Essential (primary) hypertension: Secondary | ICD-10-CM | POA: Diagnosis not present

## 2016-12-20 DIAGNOSIS — E119 Type 2 diabetes mellitus without complications: Secondary | ICD-10-CM | POA: Diagnosis not present

## 2016-12-20 DIAGNOSIS — F32A Depression, unspecified: Secondary | ICD-10-CM

## 2016-12-20 DIAGNOSIS — K589 Irritable bowel syndrome without diarrhea: Secondary | ICD-10-CM | POA: Diagnosis not present

## 2016-12-20 DIAGNOSIS — Z1231 Encounter for screening mammogram for malignant neoplasm of breast: Secondary | ICD-10-CM

## 2016-12-20 DIAGNOSIS — Z1239 Encounter for other screening for malignant neoplasm of breast: Secondary | ICD-10-CM

## 2016-12-20 NOTE — Progress Notes (Signed)
Pre-visit discussion using our clinic review tool. No additional management support is needed unless otherwise documented below in the visit note.  

## 2016-12-20 NOTE — Progress Notes (Signed)
Patient ID: Jacqueline Nichols, female   DOB: 1956/04/07, 61 y.o.   MRN: [id number]   Subjective:    Patient ID: Jacqueline Nichols, female    DOB: 10-Oct-1955, 61 y.o.   MRN: [id number]  HPI  Patient here for her physical.  She reports her bowels are relatively stable.  Taking lamotil, immodium and fiber. Controls fairly well.  No chest pain.  Breathing stable.  No acid reflux.  No abdominal pain.  Sees Dr [name] for f/u renal cell carcinoma.  Evaluated 10/23/16.  Stable.  Recommended f/u cxr and renal ultrasound in 6 months.  No increased heart rate or palpitations.  Breathing stable.     Past Medical History:  Diagnosis Date  . Cancer ([place])    skin  . Chicken pox   . Colon polyps   . Depression   . Diabetes mellitus ([place])   . GERD (gastroesophageal reflux disease)   . Hyperlipidemia   . Hypertension   . 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 Urinary incontinence    Past Surgical History:  Procedure Laterality Date  . CHOLECYSTECTOMY  2001  . COLONOSCOPY WITH PROPOFOL N/A 03/27/2016   Procedure: COLONOSCOPY WITH PROPOFOL;  Surgeon: Lucilla Lame, MD;  Location: [facility] ENDOSCOPY;  Service: Endoscopy;  Laterality: N/A;  . KNEE SURGERY  2003-2009   s/p 8 knee surgeries (with 3 knee replacements)   Family History  Problem Relation Age of Onset  . Colon cancer Mother     died - age 30  . Heart disease Father   . Hypertension Father   . Arthritis/Rheumatoid Sister   . Lung cancer Brother     smoked  . Arthritis/Rheumatoid Paternal Grandmother    Social History   Social History  . Marital status: Single    Spouse name: N/A  . Number of children: N/A  . Years of education: N/A   Social History Main Topics  . Smoking status: Former Research scientist (life sciences)  . Smokeless tobacco: Never Used     Comment: Quit Feb 2014  . Alcohol use 0.0 oz/week     Comment: social drinking  . Drug use: No  . Sexual activity: Not Asked   Other Topics Concern  . None   Social History Narrative  . None    Outpatient  Encounter Prescriptions as of 12/20/2016  Medication Sig  . bisoprolol-hydrochlorothiazide (ZIAC) 2.5-6.25 MG tablet TAKE 1 TABLET BY MOUTH EVERY DAY FOR HIGH BLOOD PRESSURE  . butalbital-acetaminophen-caffeine (FIORICET, ESGIC) 50-325-40 MG per tablet TAKE ONE (1) TABLET EACH DAY AS NEEDED FOR HEADACHE  . citalopram (CELEXA) 40 MG tablet TAKE 1 TABLET BY MOUTH EVERY DAY.  . clonazePAM (KLONOPIN) 0.5 MG tablet TAKE 1 TABLET BY MOUTH EVERY DAY AS NEEDED.  Marland Kitchen clopidogrel (PLAVIX) 75 MG tablet TAKE 1 TABLET BY MOUTH EVERY DAY.  . diphenoxylate-atropine (LOMOTIL) 2.5-0.025 MG tablet Take 1 tablet by mouth 4 (four) times daily as needed for diarrhea or loose stools.  . fluticasone (VERAMYST) 27.5 MCG/SPRAY nasal spray Place 2 sprays into the nose as needed. Reported on 03/20/2016  . hyoscyamine (ANASPAZ) 0.125 MG TBDP disintergrating tablet Place 0.125 mg under the tongue every 4 (four) hours as needed. Reported on 03/20/2016  . Loperamide HCl (IMODIUM PO) Take by mouth as needed.  . meloxicam (MOBIC) 7.5 MG tablet TAKE ONE TABLET TWICE DAILY  . Misc Natural Products (ALLERGY RELEAF SYSTEM PO) Take by mouth. Reported on 03/20/2016  . rosuvastatin (CRESTOR) 5 MG tablet TAKE 1 TABLET BY MOUTH EVERY  DAY.  . torsemide (DEMADEX) 20 MG tablet TAKE ONE (1) TABLET EACH DAY AS NEEDED   No facility-administered encounter medications on file as of 12/20/2016.     Review of Systems  Constitutional: Negative for appetite change and unexpected weight change.  HENT: Negative for congestion and sinus pressure.   Eyes: Negative for pain and visual disturbance.  Respiratory: Negative for cough, chest tightness and shortness of breath.   Cardiovascular: Negative for chest pain, palpitations and leg swelling.  Gastrointestinal: Negative for abdominal pain, constipation, nausea and vomiting.       Still with intermittent flares of her bowels.  Stable on current regimen.    Genitourinary: Negative for difficulty urinating  and dysuria.  Musculoskeletal: Negative for back pain and joint swelling.  Skin: Negative for color change and rash.  Neurological: Negative for dizziness, light-headedness and headaches.  Hematological: Negative for adenopathy. Does not bruise/bleed easily.  Psychiatric/Behavioral: Negative for agitation and dysphoric mood.       Increased stress with her work.  Discussed with her today.        Objective:    Physical Exam  Constitutional: She is oriented to person, place, and time. She appears well-developed and well-nourished. No distress.  HENT:  Nose: Nose normal.  Mouth/Throat: Oropharynx is clear and moist.  Eyes: Right eye exhibits no discharge. Left eye exhibits no discharge. No scleral icterus.  Neck: Neck supple. No thyromegaly present.  Cardiovascular: Normal rate and regular rhythm.   Pulmonary/Chest: Breath sounds normal. No accessory muscle usage. No tachypnea. No respiratory distress. She has no decreased breath sounds. She has no wheezes. She has no rhonchi. Right breast exhibits no inverted nipple, no mass, no nipple discharge and no tenderness (no axillary adenopathy). Left breast exhibits no inverted nipple, no mass, no nipple discharge and no tenderness (no axilarry adenopathy).  Abdominal: Soft. Bowel sounds are normal. There is no tenderness.  Genitourinary:  Genitourinary Comments: Normal external genitalia.  Vaginal vault without lesions.  Cervix identified.  Pap smear performed.  Could not appreciate any adnexal masses or tenderness.    Musculoskeletal: She exhibits no edema or tenderness.  Lymphadenopathy:    She has no cervical adenopathy.  Neurological: She is alert and oriented to person, place, and time.  Skin: Skin is warm. No rash noted. No erythema.  Psychiatric: She has a normal mood and affect. Her behavior is normal.    BP 110/70 (BP Location: Left Arm, Patient Position: Sitting, Cuff Size: Large)   Pulse (!) 52   Temp 98.7 F (37.1 C) (Oral)    Resp 12   Ht 5' 10.08" (1.78 m)   Wt 236 lb 3.2 oz (107.1 kg)   SpO2 97%   BMI 33.81 kg/m  Wt Readings from Last 3 Encounters:  12/20/16 236 lb 3.2 oz (107.1 kg)  06/17/16 230 lb 9.6 oz (104.6 kg)  05/08/16 225 lb (102.1 kg)     Lab Results  Component Value Date   WBC 4.8 01/04/2016   HGB 13.9 01/04/2016   HCT 41.0 01/04/2016   PLT 189 01/04/2016   GLUCOSE 84 08/02/2016   CHOL 172 08/02/2016   TRIG 170 (H) 08/02/2016   HDL 40 08/02/2016   LDLCALC 98 08/02/2016   ALT 29 08/02/2016   AST 23 08/02/2016   NA 138 08/02/2016   K 4.2 08/02/2016   CL 95 (L) 08/02/2016   CREATININE 1.04 (H) 08/02/2016   BUN 18 08/02/2016   CO2 29 08/02/2016   TSH 1.600 08/02/2016  HGBA1C 5.4 08/02/2016       Assessment & Plan:   Problem List Items Addressed This Visit    Depression    Increased stress.  Discussed with her today.  On citalopram.  Does not feel needs any further intervention.  Follow.        Diabetes mellitus ([place])    Low carb diet and exercise.  Discussed with her today.  Keep up to date with eye exams.  Follow met b and a1c.   Lab Results  Component Value Date   HGBA1C 5.4 08/02/2016        Relevant Orders   Hemoglobin A1c   Health care maintenance    Physical today 12/20/16.  Colonoscopy 03/27/16.   PAP 03/2016.  Schedule for mammogram.        Hypercholesteremia    On crestor.  Low cholesterol diet and exercise.  Follow lipid panel and liver function tests.        Relevant Orders   Hepatic function panel   Lipid panel   Hypertension    Blood pressure under good control.  Continue same medication regimen.  Follow pressures.  Follow metabolic panel.        Relevant Orders   CBC with Differential/Platelet   Basic metabolic panel   IBS (irritable bowel syndrome)    Colonoscopy 03/27/16 - one polyp.  Stable on current regimen.        Renal cell carcinoma [facility])    s/p left radical nephrectomy and partial adrenalectomy.  Followed by urology.  Evaluated  10/2016.  Recommended f/u in 6 months with cxr and renal ultrasound.        Tachycardia    Followed by Dr Clayborn Bigness.  D/p ablation.  Doing well.         Other Visit Diagnoses    Screening for breast cancer    -  Primary   Relevant Orders   MM DIGITAL SCREENING BILATERAL   Screening for cervical cancer       Relevant Orders   Cytology - PAP       Einar Pheasant, MD

## 2016-12-20 NOTE — Assessment & Plan Note (Addendum)
Physical today 12/20/16.  Colonoscopy 03/27/16.   PAP 03/2016.  Schedule for mammogram.

## 2016-12-22 ENCOUNTER — Encounter: Payer: Self-pay | Admitting: Internal Medicine

## 2016-12-22 NOTE — Assessment & Plan Note (Signed)
s/p left radical nephrectomy and partial adrenalectomy.  Followed by urology.  Evaluated 10/2016.  Recommended f/u in 6 months with cxr and renal ultrasound.

## 2016-12-22 NOTE — Assessment & Plan Note (Signed)
Increased stress.  Discussed with her today.  On citalopram.  Does not feel needs any further intervention.  Follow.

## 2016-12-22 NOTE — Assessment & Plan Note (Signed)
Blood pressure under good control.  Continue same medication regimen.  Follow pressures.  Follow metabolic panel.   

## 2016-12-22 NOTE — Assessment & Plan Note (Signed)
On crestor.  Low cholesterol diet and exercise.  Follow lipid panel and liver function tests.   

## 2016-12-22 NOTE — Assessment & Plan Note (Signed)
Colonoscopy 03/27/16 - one polyp.  Stable on current regimen.

## 2016-12-22 NOTE — Assessment & Plan Note (Signed)
Followed by Dr Clayborn Bigness.  D/p ablation.  Doing well.

## 2016-12-22 NOTE — Assessment & Plan Note (Signed)
Low carb diet and exercise.  Discussed with her today.  Keep up to date with eye exams.  Follow met b and a1c.   Lab Results  Component Value Date   HGBA1C 5.4 08/02/2016

## 2016-12-24 LAB — CYTOLOGY - PAP
Diagnosis: NEGATIVE
HPV (WINDOPATH): NOT DETECTED

## 2016-12-25 ENCOUNTER — Encounter: Payer: Self-pay | Admitting: Internal Medicine

## 2016-12-30 DIAGNOSIS — I10 Essential (primary) hypertension: Secondary | ICD-10-CM | POA: Diagnosis not present

## 2016-12-30 DIAGNOSIS — E119 Type 2 diabetes mellitus without complications: Secondary | ICD-10-CM | POA: Diagnosis not present

## 2016-12-30 DIAGNOSIS — E7800 Pure hypercholesterolemia, unspecified: Secondary | ICD-10-CM | POA: Diagnosis not present

## 2016-12-31 LAB — HEPATIC FUNCTION PANEL
ALBUMIN: 4.4 g/dL (ref 3.5–5.5)
ALK PHOS: 116 IU/L (ref 39–117)
ALT: 13 IU/L (ref 0–32)
AST: 15 IU/L (ref 0–40)
BILIRUBIN TOTAL: 0.4 mg/dL (ref 0.0–1.2)
BILIRUBIN, DIRECT: 0.12 mg/dL (ref 0.00–0.40)
TOTAL PROTEIN: 6.6 g/dL (ref 6.0–8.5)

## 2016-12-31 LAB — CBC WITH DIFFERENTIAL/PLATELET
BASOS: 0 %
Basophils Absolute: 0 10*3/uL (ref 0.0–0.2)
EOS (ABSOLUTE): 0.1 10*3/uL (ref 0.0–0.4)
Eos: 2 %
HEMATOCRIT: 42.9 % (ref 34.0–46.6)
Hemoglobin: 14.8 g/dL (ref 11.1–15.9)
IMMATURE GRANS (ABS): 0 10*3/uL (ref 0.0–0.1)
IMMATURE GRANULOCYTES: 0 %
LYMPHS: 26 %
Lymphocytes Absolute: 1.7 10*3/uL (ref 0.7–3.1)
MCH: 29.1 pg (ref 26.6–33.0)
MCHC: 34.5 g/dL (ref 31.5–35.7)
MCV: 84 fL (ref 79–97)
MONOS ABS: 0.4 10*3/uL (ref 0.1–0.9)
Monocytes: 6 %
NEUTROS ABS: 4.3 10*3/uL (ref 1.4–7.0)
NEUTROS PCT: 66 %
Platelets: 237 10*3/uL (ref 150–379)
RBC: 5.08 x10E6/uL (ref 3.77–5.28)
RDW: 14.3 % (ref 12.3–15.4)
WBC: 6.5 10*3/uL (ref 3.4–10.8)

## 2016-12-31 LAB — LIPID PANEL
CHOL/HDL RATIO: 3.6 ratio (ref 0.0–4.4)
Cholesterol, Total: 179 mg/dL (ref 100–199)
HDL: 50 mg/dL (ref >39)
LDL CALC: 95 mg/dL (ref 0–99)
Triglycerides: 172 mg/dL — ABNORMAL HIGH (ref 0–149)
VLDL Cholesterol Cal: 34 mg/dL (ref 5–40)

## 2016-12-31 LAB — HEMOGLOBIN A1C
Est. average glucose Bld gHb Est-mCnc: 114 mg/dL
Hgb A1c MFr Bld: 5.6 % (ref 4.8–5.6)

## 2016-12-31 LAB — BASIC METABOLIC PANEL
BUN/Creatinine Ratio: 19 (ref 9–23)
BUN: 23 mg/dL (ref 6–24)
CALCIUM: 9.6 mg/dL (ref 8.7–10.2)
CHLORIDE: 98 mmol/L (ref 96–106)
CO2: 24 mmol/L (ref 18–29)
Creatinine, Ser: 1.18 mg/dL — ABNORMAL HIGH (ref 0.57–1.00)
GFR calc Af Amer: 58 mL/min/{1.73_m2} — ABNORMAL LOW (ref >59)
GFR, EST NON AFRICAN AMERICAN: 51 mL/min/{1.73_m2} — AB (ref >59)
GLUCOSE: 91 mg/dL (ref 65–99)
POTASSIUM: 4.5 mmol/L (ref 3.5–5.2)
SODIUM: 139 mmol/L (ref 134–144)

## 2016-12-31 NOTE — Telephone Encounter (Signed)
Hard copy mailed to patient.

## 2017-01-02 ENCOUNTER — Encounter: Payer: Self-pay | Admitting: Internal Medicine

## 2017-01-03 ENCOUNTER — Other Ambulatory Visit: Payer: Self-pay | Admitting: Gastroenterology

## 2017-01-07 ENCOUNTER — Other Ambulatory Visit: Payer: Self-pay

## 2017-01-07 MED ORDER — DIPHENOXYLATE-ATROPINE 2.5-0.025 MG PO TABS: 1.0000 | ORAL_TABLET | Freq: Four times a day (QID) | ORAL | 5 refills | Status: DC | PRN

## 2017-01-10 ENCOUNTER — Ambulatory Visit
Admission: RE | Admit: 2017-01-10 | Discharge: 2017-01-10 | Disposition: A | Discharge status: Home / Self Care | Payer: 59 | Source: Ambulatory Visit | Attending: Internal Medicine | Admitting: Internal Medicine

## 2017-01-10 DIAGNOSIS — Z1231 Encounter for screening mammogram for malignant neoplasm of breast: Secondary | ICD-10-CM | POA: Diagnosis present

## 2017-01-10 DIAGNOSIS — Z1239 Encounter for other screening for malignant neoplasm of breast: Secondary | ICD-10-CM

## 2017-02-13 ENCOUNTER — Other Ambulatory Visit: Payer: Self-pay | Admitting: Internal Medicine

## 2017-02-13 ENCOUNTER — Other Ambulatory Visit: Payer: Self-pay | Admitting: *Deleted

## 2017-02-13 NOTE — Telephone Encounter (Signed)
Patient requested to have a refill for generic Plavix, Crestor, Ziac, Celexa Demadex clonazepam and imodium- 90 day [place]  Pt contact [phone]

## 2017-02-13 NOTE — Telephone Encounter (Signed)
I have ordered refills on all if ok?

## 2017-02-14 MED ORDER — CLOPIDOGREL BISULFATE 75 MG PO TABS: 75.0000 mg | ORAL_TABLET | Freq: Every day | ORAL | 1 refills | Status: DC

## 2017-02-14 MED ORDER — BISOPROLOL-HYDROCHLOROTHIAZIDE 2.5-6.25 MG PO TABS: 1.0000 | ORAL_TABLET | Freq: Every day | ORAL | 1 refills | Status: DC

## 2017-02-14 MED ORDER — ROSUVASTATIN CALCIUM 5 MG PO TABS: 5.0000 mg | ORAL_TABLET | Freq: Every day | ORAL | 1 refills | Status: DC

## 2017-02-14 MED ORDER — TORSEMIDE 20 MG PO TABS: ORAL_TABLET | ORAL | 0 refills | Status: DC

## 2017-02-14 MED ORDER — CITALOPRAM HYDROBROMIDE 40 MG PO TABS: 40.0000 mg | ORAL_TABLET | Freq: Every day | ORAL | 1 refills | Status: DC

## 2017-02-14 MED ORDER — CLONAZEPAM 0.5 MG PO TABS: 0.5000 mg | ORAL_TABLET | Freq: Every day | ORAL | 0 refills | Status: DC | PRN

## 2017-02-14 NOTE — Telephone Encounter (Signed)
Left message for patient to return call back.  

## 2017-02-14 NOTE — Telephone Encounter (Signed)
Patient takes fluid pill as needed. Please advise.

## 2017-02-14 NOTE — Telephone Encounter (Signed)
Spoken to patient she takes her fluid pill as needed. Please advise.

## 2017-02-14 NOTE — Telephone Encounter (Signed)
Prior to sending, just need to clarify - how often is she taking the demadex?  Thanks

## 2017-03-21 DIAGNOSIS — C44519 Basal cell carcinoma of skin of other part of trunk: Secondary | ICD-10-CM | POA: Diagnosis not present

## 2017-03-21 DIAGNOSIS — C44719 Basal cell carcinoma of skin of left lower limb, including hip: Secondary | ICD-10-CM | POA: Diagnosis not present

## 2017-03-21 DIAGNOSIS — D485 Neoplasm of uncertain behavior of skin: Secondary | ICD-10-CM | POA: Diagnosis not present

## 2017-03-21 DIAGNOSIS — L718 Other rosacea: Secondary | ICD-10-CM | POA: Diagnosis not present

## 2017-03-21 DIAGNOSIS — L570 Actinic keratosis: Secondary | ICD-10-CM | POA: Diagnosis not present

## 2017-03-25 ENCOUNTER — Other Ambulatory Visit: Payer: Self-pay | Admitting: Internal Medicine

## 2017-03-25 NOTE — Telephone Encounter (Signed)
Please advise for refill, last one was 05/01/2015, for #20 with no refills, last OV was 12/20/2016. Thanks

## 2017-03-25 NOTE — Telephone Encounter (Signed)
Message reviewed.  Tried calling pt.  Left message.  Need clarification.  Need to confirm how she is doing.  Has she been having more trouble with headaches?  Any change in headaches?  Has taken fioricet and tolerated?

## 2017-03-25 NOTE — Telephone Encounter (Signed)
Pharmacy called wanting to make sure that we got this request. Pt is at work with a really bad migraine and is in need of this medication. Please advise, thank you!

## 2017-03-26 NOTE — Telephone Encounter (Signed)
Attempted to reach the patient, left a VM.

## 2017-03-26 NOTE — Telephone Encounter (Signed)
Spoke with the patient.  Per the patient, had a migraine from Sunday through Tuesday.  Has taken the fiorcet for years and it works good.  No changes with with headaches, only uses the medication when needed.  Still had some, but it was three years old so that is why she was requesting a new RX. Please advise, thanks

## 2017-03-26 NOTE — Telephone Encounter (Signed)
ok'd rx for fioricet #15 with no refills.

## 2017-03-26 NOTE — Telephone Encounter (Signed)
Pt called back returning your call. Please advise, thank you!  Call pt @ [phone]

## 2017-03-26 NOTE — Telephone Encounter (Signed)
Script faxed.

## 2017-04-10 LAB — LIPID PANEL
Cholesterol: 159 (ref 0–200)
HDL: 42 (ref 35–70)
LDL CALC: 90
Triglycerides: 136 (ref 40–160)

## 2017-04-10 LAB — HEMOGLOBIN A1C: Hemoglobin A1C: 5.6

## 2017-04-10 LAB — BASIC METABOLIC PANEL: GLUCOSE: 96

## 2017-04-23 ENCOUNTER — Telehealth: Payer: Self-pay | Admitting: Gastroenterology

## 2017-04-23 NOTE — Telephone Encounter (Signed)
Needs refill TODAY for [place]

## 2017-04-23 NOTE — Telephone Encounter (Signed)
Left vm letting pt know she already has refills available at [facility] left. Advised her to contact them if she wants it delivered.

## 2017-04-24 ENCOUNTER — Other Ambulatory Visit: Payer: Self-pay

## 2017-04-24 ENCOUNTER — Telehealth: Payer: Self-pay | Admitting: Gastroenterology

## 2017-04-24 MED ORDER — DIPHENOXYLATE-ATROPINE 2.5-0.025 MG PO TABS
1.0000 | ORAL_TABLET | Freq: Four times a day (QID) | ORAL | 2 refills | Status: DC | PRN
Start: 2017-04-24 — End: 2018-02-17

## 2017-04-24 NOTE — Telephone Encounter (Signed)
[facility] and changed Lomotil from 90 to 120 tablets. Pt has been notified of change.

## 2017-04-24 NOTE — Telephone Encounter (Signed)
Izora Gala from [facility] called and stated that the patient needs a RX for Lomotal 120 tabs. She takes them 4 times a day.

## 2017-04-25 ENCOUNTER — Ambulatory Visit: Payer: 59 | Admitting: Internal Medicine

## 2017-05-12 ENCOUNTER — Other Ambulatory Visit: Payer: Self-pay | Admitting: Internal Medicine

## 2017-05-26 DIAGNOSIS — B9689 Other specified bacterial agents as the cause of diseases classified elsewhere: Secondary | ICD-10-CM | POA: Diagnosis not present

## 2017-05-26 DIAGNOSIS — J019 Acute sinusitis, unspecified: Secondary | ICD-10-CM | POA: Diagnosis not present

## 2017-05-29 DIAGNOSIS — Z96653 Presence of artificial knee joint, bilateral: Secondary | ICD-10-CM | POA: Diagnosis not present

## 2017-06-04 DIAGNOSIS — Z96653 Presence of artificial knee joint, bilateral: Secondary | ICD-10-CM | POA: Insufficient documentation

## 2017-06-20 ENCOUNTER — Ambulatory Visit (INDEPENDENT_AMBULATORY_CARE_PROVIDER_SITE_OTHER): Payer: 59 | Admitting: Internal Medicine

## 2017-06-20 ENCOUNTER — Encounter: Payer: Self-pay | Admitting: Internal Medicine

## 2017-06-20 DIAGNOSIS — E119 Type 2 diabetes mellitus without complications: Secondary | ICD-10-CM | POA: Diagnosis not present

## 2017-06-20 DIAGNOSIS — K529 Noninfective gastroenteritis and colitis, unspecified: Secondary | ICD-10-CM

## 2017-06-20 DIAGNOSIS — E7800 Pure hypercholesterolemia, unspecified: Secondary | ICD-10-CM | POA: Diagnosis not present

## 2017-06-20 DIAGNOSIS — C642 Malignant neoplasm of left kidney, except renal pelvis: Secondary | ICD-10-CM

## 2017-06-20 DIAGNOSIS — F329 Major depressive disorder, single episode, unspecified: Secondary | ICD-10-CM | POA: Diagnosis not present

## 2017-06-20 DIAGNOSIS — G44039 Episodic paroxysmal hemicrania, not intractable: Secondary | ICD-10-CM

## 2017-06-20 DIAGNOSIS — F32A Depression, unspecified: Secondary | ICD-10-CM

## 2017-06-20 DIAGNOSIS — I10 Essential (primary) hypertension: Secondary | ICD-10-CM

## 2017-06-20 NOTE — Progress Notes (Signed)
Patient ID: Jacqueline Nichols, female   DOB: [birth year], 61 y.o.   MRN: [id number]   Subjective:    Patient ID: Jacqueline Nichols, female    DOB: May 13, 1956, 61 y.o.   MRN: [id number]  HPI  Patient here for a scheduled follow up.  She has adjusted her diet some.  Has lost some weight.  Discussed continued diet and exercise.  No chest pain.  Breathing stable.  Saw Dr Marry Guan recently for f/u regarding her knee surgeries.  Doing well.  Recommended f/u in one year.  Saw Dr [name] 10/2016.  Stable.  Recommended 6 month f/u renal ultrasound and cxr.  She states this was rescheduled to 08/2017.  She is still having diarrhea.  Some days better than others.  Followed by GI.  Takes two lamotil and one immodium bid.  She also reports some intermittent headache.  Hurts - center - above her nose.  Nasal congestion.  She also reports some increased headache with increased stress.  When she leaves work, headache better.  Takes intermittent tylenol and fioricet.  Discussed further w/up for her headache.  She declines.  Thinks is related to her sinuses and stress.  Discussed the increased stress.  She is on citalopram.  Discussed referral to psychiatry.  She will notify me if agreeable.     Past Medical History:  Diagnosis Date  . Cancer ([place])    skin  . Chicken pox   . Colon polyps   . Depression   . Diabetes mellitus ([place])   . GERD (gastroesophageal reflux disease)   . Hyperlipidemia   . Hypertension   . 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 Urinary incontinence    Past Surgical History:  Procedure Laterality Date  . CHOLECYSTECTOMY  2001  . COLONOSCOPY WITH PROPOFOL N/A 03/27/2016   Procedure: COLONOSCOPY WITH PROPOFOL;  Surgeon: Lucilla Lame, MD;  Location: [facility] ENDOSCOPY;  Service: Endoscopy;  Laterality: N/A;  . KNEE SURGERY  2003-2009   s/p 8 knee surgeries (with 3 knee replacements)   Family History  Problem Relation Age of Onset  . Colon cancer Mother        died - age 17  . Heart disease Father   .  Hypertension Father   . Arthritis/Rheumatoid Sister   . Lung cancer Brother        smoked  . Arthritis/Rheumatoid Paternal Grandmother   . Breast cancer Neg Hx    Social History   Social History  . Marital status: Single    Spouse name: N/A  . Number of children: N/A  . Years of education: N/A   Social History Main Topics  . Smoking status: Former Research scientist (life sciences)  . Smokeless tobacco: Never Used     Comment: Quit Feb 2014  . Alcohol use 0.0 oz/week     Comment: social drinking  . Drug use: No  . Sexual activity: Not Asked   Other Topics Concern  . None   Social History Narrative  . None    Outpatient Encounter Prescriptions as of 06/20/2017  Medication Sig  . bisoprolol-hydrochlorothiazide (ZIAC) 2.5-6.25 MG tablet Take 1 tablet by mouth daily. for high blood pressure  . butalbital-acetaminophen-caffeine (FIORICET, ESGIC) 50-325-40 MG tablet TAKE ONE TABLET EACH DAY AS NEEDED FOR HEADACHE  . citalopram (CELEXA) 40 MG tablet Take 1 tablet (40 mg total) by mouth daily.  . clonazePAM (KLONOPIN) 0.5 MG tablet Take 1 tablet (0.5 mg total) by mouth daily as needed.  . clopidogrel (PLAVIX) 75 MG  tablet Take 1 tablet (75 mg total) by mouth daily.  . diphenoxylate-atropine (LOMOTIL) 2.5-0.025 MG tablet Take 1 tablet by mouth 4 (four) times daily as needed for diarrhea or loose stools.  . fluticasone (VERAMYST) 27.5 MCG/SPRAY nasal spray Place 2 sprays into the nose as needed. Reported on 03/20/2016  . hyoscyamine (ANASPAZ) 0.125 MG TBDP disintergrating tablet Place 0.125 mg under the tongue every 4 (four) hours as needed. Reported on 03/20/2016  . Loperamide HCl (IMODIUM PO) Take by mouth as needed.  . meloxicam (MOBIC) 7.5 MG tablet TAKE ONE TABLET TWICE DAILY  . Misc Natural Products (ALLERGY RELEAF SYSTEM PO) Take by mouth. Reported on 03/20/2016  . rosuvastatin (CRESTOR) 5 MG tablet Take 1 tablet (5 mg total) by mouth daily.  Marland Kitchen torsemide (DEMADEX) 20 MG tablet TAKE 1 TABLET BY MOUTH EVERY  DAY AS NEEDED.   No facility-administered encounter medications on file as of 06/20/2017.     Review of Systems  Constitutional: Negative for appetite change and unexpected weight change.  HENT: Positive for congestion and sinus pressure.   Respiratory: Negative for cough, chest tightness and shortness of breath.   Cardiovascular: Negative for chest pain, palpitations and leg swelling.  Gastrointestinal: Positive for diarrhea. Negative for abdominal pain, nausea and vomiting.  Genitourinary: Negative for difficulty urinating and dysuria.  Musculoskeletal: Negative for joint swelling and myalgias.  Skin: Negative for color change and rash.  Neurological: Positive for headaches. Negative for dizziness and light-headedness.  Psychiatric/Behavioral: Negative for agitation and dysphoric mood.       Increased stress.         Objective:    Physical Exam  Constitutional: She appears well-developed and well-nourished. No distress.  HENT:  Nose: Nose normal.  Mouth/Throat: Oropharynx is clear and moist.  Neck: Neck supple. No thyromegaly present.  Cardiovascular: Normal rate and regular rhythm.   Pulmonary/Chest: Breath sounds normal. No respiratory distress. She has no wheezes.  Abdominal: Soft. Bowel sounds are normal. There is no tenderness.  Musculoskeletal: She exhibits no edema or tenderness.  Lymphadenopathy:    She has no cervical adenopathy.  Skin: No rash noted. No erythema.  Psychiatric: She has a normal mood and affect. Her behavior is normal.    BP 112/68 (BP Location: Left Arm, Patient Position: Sitting, Cuff Size: Normal)   Pulse 62   Temp 98.3 F (36.8 C) (Oral)   Resp 12   Ht _0  (1.778 m)   Wt 224 lb 6.4 oz (101.8 kg)   SpO2 95%   BMI 32.20 kg/m  Wt Readings from Last 3 Encounters:  06/20/17 224 lb 6.4 oz (101.8 kg)  12/20/16 236 lb 3.2 oz (107.1 kg)  06/17/16 230 lb 9.6 oz (104.6 kg)     Lab Results  Component Value Date   WBC 6.5 12/30/2016   HGB  14.8 12/30/2016   HCT 42.9 12/30/2016   PLT 237 12/30/2016   GLUCOSE 91 12/30/2016   CHOL 159 04/10/2017   TRIG 136 04/10/2017   HDL 42 04/10/2017   LDLCALC 90 04/10/2017   ALT 13 12/30/2016   AST 15 12/30/2016   NA 139 12/30/2016   K 4.5 12/30/2016   CL 98 12/30/2016   CREATININE 1.18 (H) 12/30/2016   BUN 23 12/30/2016   CO2 24 12/30/2016   TSH 1.600 08/02/2016   HGBA1C 5.60 04/10/2017    Mm Screening Breast Tomo Bilateral  Result Date: 01/13/2017 CLINICAL DATA:  Screening. EXAM: 2D DIGITAL SCREENING BILATERAL MAMMOGRAM WITH CAD AND ADJUNCT  TOMO COMPARISON:  Previous exam(s). ACR Breast Density Category b: There are scattered areas of fibroglandular density. FINDINGS: There are no findings suspicious for malignancy. Images were processed with CAD. IMPRESSION: No mammographic evidence of malignancy. A result letter of this screening mammogram will be mailed directly to the patient. RECOMMENDATION: Screening mammogram in one year. (Code:SM-B-01Y) BI-RADS CATEGORY  1: Negative. Electronically Signed   By: Lovey Newcomer M.D.   On: 01/13/2017 09:01       Assessment & Plan:   Problem List Items Addressed This Visit    Depression    Increased stress.  Discussed with her today.  Already on citalopram 66m q day.  Discussed seeing psychiatry.  She declines. Will follow.        [place]    Low carb diet and exercise.  Follow met b and a1c.        Headache    Discussed with her today.  Use saline and steroid nasal spray as directed.  Discussed treating stress.  Discussed further evaluation and w/up including scanning.  She declines.  Treat above.  Follow.  If persistent, will need to be evaluated.        Hypercholesteremia    On crestor.  Low cholesterol diet and exercise.  Follow lipid panel and liver function tests.        Hypertension    Blood pressure under good control.  Continue same medication regimen.  Follow pressures.  Follow metabolic panel.         Noninfectious diarrhea    Seeing Dr WAllen Norris  On lamotil and immodium as outlined.  Still with good and bad days.  Continue f/u with Dr WAllen Norris        Renal cell carcinoma ([facility])    S/p left radical nephrectomy and partial adrenalectomy.  Followed by Dr CJacqlyn Larsen  Last evaluated 10/2016.  Has f/u planned in 08/2017 -for f/u renal ultrasound and cxr.            SEinar Pheasant MD

## 2017-06-22 ENCOUNTER — Encounter: Payer: Self-pay | Admitting: Internal Medicine

## 2017-06-22 DIAGNOSIS — R519 Headache, unspecified: Secondary | ICD-10-CM | POA: Insufficient documentation

## 2017-06-22 DIAGNOSIS — R51 Headache: Secondary | ICD-10-CM

## 2017-06-22 NOTE — Assessment & Plan Note (Signed)
Discussed with her today.  Use saline and steroid nasal spray as directed.  Discussed treating stress.  Discussed further evaluation and w/up including scanning.  She declines.  Treat above.  Follow.  If persistent, will need to be evaluated.

## 2017-06-22 NOTE — Assessment & Plan Note (Signed)
Low carb diet and exercise.  Follow met b and a1c.   

## 2017-06-22 NOTE — Assessment & Plan Note (Signed)
Seeing Dr Allen Norris.  On lamotil and immodium as outlined.  Still with good and bad days.  Continue f/u with Dr Allen Norris.

## 2017-06-22 NOTE — Assessment & Plan Note (Signed)
On crestor.  Low cholesterol diet and exercise.  Follow lipid panel and liver function tests.   

## 2017-06-22 NOTE — Assessment & Plan Note (Signed)
Blood pressure under good control.  Continue same medication regimen.  Follow pressures.  Follow metabolic panel.   

## 2017-06-22 NOTE — Assessment & Plan Note (Signed)
S/p left radical nephrectomy and partial adrenalectomy.  Followed by Dr [name].  Last evaluated 10/2016.  Has f/u planned in 08/2017 -for f/u renal ultrasound and cxr.

## 2017-06-22 NOTE — Assessment & Plan Note (Signed)
Increased stress.  Discussed with her today.  Already on citalopram 40mg  q day.  Discussed seeing psychiatry.  She declines. Will follow.

## 2017-06-26 ENCOUNTER — Other Ambulatory Visit: Payer: Self-pay | Admitting: Internal Medicine

## 2017-06-26 ENCOUNTER — Telehealth: Payer: Self-pay | Admitting: Internal Medicine

## 2017-06-26 ENCOUNTER — Telehealth: Payer: Self-pay

## 2017-06-26 MED ORDER — NYSTATIN 100000 UNIT/GM EX CREA: 1.0000 "application " | TOPICAL_CREAM | Freq: Two times a day (BID) | CUTANEOUS | 0 refills | Status: DC

## 2017-06-26 MED ORDER — BUTALBITAL-APAP-CAFFEINE 50-325-40 MG PO TABS: ORAL_TABLET | ORAL | 0 refills | Status: DC

## 2017-06-26 NOTE — Progress Notes (Signed)
Order sent in for nystatin cream and fioricet.

## 2017-06-26 NOTE — Telephone Encounter (Signed)
Pt called requesting a refill on her butalbital-acetaminophen-caffeine (FIORICET, ESGIC) 50-325-40 MG tablet. Pt also stated that Dr. Nicki Reaper was supposed to call in a cream for her belly button. Please advise, thank you!  [place], [place]  Call pt @ [phone]

## 2017-06-26 NOTE — Telephone Encounter (Signed)
Last script was given on 03/26/17 #15 0 rf. I do not see anything in note about cream?

## 2017-06-26 NOTE — Telephone Encounter (Signed)
rx sent in for both meds (nystatin cream and fioricet).

## 2017-06-26 NOTE — Telephone Encounter (Signed)
Fioricet script faxed

## 2017-07-07 ENCOUNTER — Telehealth: Payer: Self-pay | Admitting: Gastroenterology

## 2017-07-07 NOTE — Telephone Encounter (Signed)
Patient left a voice message wanting to know if you received her email.

## 2017-07-08 NOTE — Telephone Encounter (Signed)
Sent pt an email letting her know I received her email.

## 2017-07-14 ENCOUNTER — Other Ambulatory Visit: Payer: Self-pay | Admitting: Internal Medicine

## 2017-07-14 ENCOUNTER — Telehealth: Payer: Self-pay | Admitting: Gastroenterology

## 2017-07-14 NOTE — Telephone Encounter (Signed)
Can she get a 90 day supply of Lamotal? Please call

## 2017-07-16 ENCOUNTER — Other Ambulatory Visit: Payer: Self-pay

## 2017-07-16 NOTE — Telephone Encounter (Signed)
Refills sent to pt's pharmacy per her request.  

## 2017-08-19 DIAGNOSIS — Z85528 Personal history of other malignant neoplasm of kidney: Secondary | ICD-10-CM | POA: Diagnosis not present

## 2017-08-19 DIAGNOSIS — N2889 Other specified disorders of kidney and ureter: Secondary | ICD-10-CM | POA: Diagnosis not present

## 2017-08-19 DIAGNOSIS — K7689 Other specified diseases of liver: Secondary | ICD-10-CM | POA: Diagnosis not present

## 2017-08-20 DIAGNOSIS — Z85528 Personal history of other malignant neoplasm of kidney: Secondary | ICD-10-CM | POA: Insufficient documentation

## 2017-09-26 ENCOUNTER — Encounter: Payer: Self-pay | Admitting: Internal Medicine

## 2017-09-26 ENCOUNTER — Ambulatory Visit: Payer: 59 | Admitting: Internal Medicine

## 2017-09-26 DIAGNOSIS — K589 Irritable bowel syndrome without diarrhea: Secondary | ICD-10-CM

## 2017-09-26 DIAGNOSIS — E7800 Pure hypercholesterolemia, unspecified: Secondary | ICD-10-CM

## 2017-09-26 DIAGNOSIS — Z8679 Personal history of other diseases of the circulatory system: Secondary | ICD-10-CM | POA: Diagnosis not present

## 2017-09-26 DIAGNOSIS — C642 Malignant neoplasm of left kidney, except renal pelvis: Secondary | ICD-10-CM

## 2017-09-26 DIAGNOSIS — C44519 Basal cell carcinoma of skin of other part of trunk: Secondary | ICD-10-CM | POA: Diagnosis not present

## 2017-09-26 DIAGNOSIS — F329 Major depressive disorder, single episode, unspecified: Secondary | ICD-10-CM

## 2017-09-26 DIAGNOSIS — E119 Type 2 diabetes mellitus without complications: Secondary | ICD-10-CM | POA: Diagnosis not present

## 2017-09-26 DIAGNOSIS — F32A Depression, unspecified: Secondary | ICD-10-CM

## 2017-09-26 DIAGNOSIS — I10 Essential (primary) hypertension: Secondary | ICD-10-CM

## 2017-09-26 DIAGNOSIS — Z23 Encounter for immunization: Secondary | ICD-10-CM

## 2017-09-26 NOTE — Progress Notes (Signed)
Patient ID: Jacqueline Nichols, female   DOB: 06/27/1956, 62 y.o.   MRN: [id number]   Subjective:    Patient ID: Jacqueline Nichols, female    DOB: 09-21-1955, 62 y.o.   MRN: [id number]  HPI  Patient here for a scheduled follow up.  She reports she is doing relatively well.  She had adjusted her diet.  Has kept her weight off.  Trying to watch what she eats.  No chest pain.  No sob.  No acid reflux.  No abdominal pain.  Bowels flare at times. Overall stable.  Has seen GI.  Seeing Dr [name].  Stable.  Recommended f/u in 6 months with renal ultrasound and cxr.     Past Medical History:  Diagnosis Date  . Cancer ([place])    skin  . Chicken pox   . Colon polyps   . Depression   . Diabetes mellitus ([place])   . GERD (gastroesophageal reflux disease)   . Hyperlipidemia   . Hypertension   . 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 Urinary incontinence    Past Surgical History:  Procedure Laterality Date  . CHOLECYSTECTOMY  2001  . COLONOSCOPY WITH PROPOFOL N/A 03/27/2016   Procedure: COLONOSCOPY WITH PROPOFOL;  Surgeon: Lucilla Lame, MD;  Location: [facility] ENDOSCOPY;  Service: Endoscopy;  Laterality: N/A;  . KNEE SURGERY  2003-2009   s/p 8 knee surgeries (with 3 knee replacements)   Family History  Problem Relation Age of Onset  . Colon cancer Mother        died - age 58  . Heart disease Father   . Hypertension Father   . Arthritis/Rheumatoid Sister   . Lung cancer Brother        smoked  . Arthritis/Rheumatoid Paternal Grandmother   . Breast cancer Neg Hx    Social History   Socioeconomic History  . Marital status: Single    Spouse name: None  . Number of children: None  . Years of education: None  . Highest education level: None  Social Needs  . Financial resource strain: None  . Food insecurity - worry: None  . Food insecurity - inability: None  . Transportation needs - medical: None  . Transportation needs - non-medical: None  Occupational History  . None  Tobacco Use  . Smoking status: Former  Research scientist (life sciences)  . Smokeless tobacco: Never Used  . Tobacco comment: Quit Feb 2014  Substance and Sexual Activity  . Alcohol use: Yes    Alcohol/week: 0.0 oz    Comment: social drinking  . Drug use: No  . Sexual activity: None  Other Topics Concern  . None  Social History Narrative  . None    Outpatient Encounter Medications as of 09/26/2017  Medication Sig  . bisoprolol-hydrochlorothiazide (ZIAC) 2.5-6.25 MG tablet TAKE 1 TABLET BY MOUTH DAILY FOR HIGH BLOOD PRESSURE  . butalbital-acetaminophen-caffeine (FIORICET, ESGIC) 50-325-40 MG tablet TAKE ONE TABLET EACH DAY AS NEEDED FOR HEADACHE  . citalopram (CELEXA) 40 MG tablet TAKE 1 TABLET BY MOUTH EVERY DAY  . clonazePAM (KLONOPIN) 0.5 MG tablet Take 1 tablet (0.5 mg total) by mouth daily as needed.  . clopidogrel (PLAVIX) 75 MG tablet Take 1 tablet (75 mg total) by mouth daily.  . diphenoxylate-atropine (LOMOTIL) 2.5-0.025 MG tablet Take 1 tablet by mouth 4 (four) times daily as needed for diarrhea or loose stools.  . fluticasone (VERAMYST) 27.5 MCG/SPRAY nasal spray Place 2 sprays into the nose as needed. Reported on 03/20/2016  . hyoscyamine (ANASPAZ)  0.125 MG TBDP disintergrating tablet Place 0.125 mg under the tongue every 4 (four) hours as needed. Reported on 03/20/2016  . Loperamide HCl (IMODIUM PO) Take by mouth as needed.  . meloxicam (MOBIC) 7.5 MG tablet TAKE ONE TABLET TWICE DAILY  . Misc Natural Products (ALLERGY RELEAF SYSTEM PO) Take by mouth. Reported on 03/20/2016  . nystatin cream (MYCOSTATIN) Apply 1 application topically 2 (two) times daily.  . rosuvastatin (CRESTOR) 5 MG tablet TAKE 1 TABLET BY MOUTH EVERY DAY  . torsemide (DEMADEX) 20 MG tablet TAKE 1 TABLET BY MOUTH EVERY DAY AS NEEDED.   No facility-administered encounter medications on file as of 09/26/2017.     Review of Systems  Constitutional: Negative for appetite change and unexpected weight change.  HENT: Negative for congestion and sinus pressure.   Respiratory:  Negative for cough, chest tightness and shortness of breath.   Cardiovascular: Negative for chest pain, palpitations and leg swelling.  Gastrointestinal: Negative for abdominal pain, nausea and vomiting.  Genitourinary: Negative for difficulty urinating and dysuria.  Musculoskeletal: Negative for back pain and joint swelling.  Skin: Negative for color change and rash.  Neurological: Negative for dizziness, light-headedness and headaches.  Psychiatric/Behavioral: Negative for agitation and dysphoric mood.       Objective:    Physical Exam  Constitutional: She appears well-developed and well-nourished. No distress.  HENT:  Nose: Nose normal.  Mouth/Throat: Oropharynx is clear and moist.  Neck: Neck supple. No thyromegaly present.  Cardiovascular: Normal rate and regular rhythm.  Pulmonary/Chest: Breath sounds normal. No respiratory distress. She has no wheezes.  Abdominal: Soft. Bowel sounds are normal. There is no tenderness.  Musculoskeletal: She exhibits no edema or tenderness.  Lymphadenopathy:    She has no cervical adenopathy.  Skin: No rash noted. No erythema.  Psychiatric: She has a normal mood and affect. Her behavior is normal.    BP 118/78 (BP Location: Left Arm, Patient Position: Sitting, Cuff Size: Large)   Pulse (!) 58   Temp 97.7 F (36.5 C) (Oral)   Resp 18   Wt 223 lb 3.2 oz (101.2 kg)   SpO2 96%   BMI 32.03 kg/m  Wt Readings from Last 3 Encounters:  09/26/17 223 lb 3.2 oz (101.2 kg)  06/20/17 224 lb 6.4 oz (101.8 kg)  12/20/16 236 lb 3.2 oz (107.1 kg)     Lab Results  Component Value Date   WBC 6.5 12/30/2016   HGB 14.8 12/30/2016   HCT 42.9 12/30/2016   PLT 237 12/30/2016   GLUCOSE 91 12/30/2016   CHOL 159 04/10/2017   TRIG 136 04/10/2017   HDL 42 04/10/2017   LDLCALC 90 04/10/2017   ALT 13 12/30/2016   AST 15 12/30/2016   NA 139 12/30/2016   K 4.5 12/30/2016   CL 98 12/30/2016   CREATININE 1.18 (H) 12/30/2016   BUN 23 12/30/2016   CO2  24 12/30/2016   TSH 1.600 08/02/2016   HGBA1C 5.60 04/10/2017    Mm Screening Breast Tomo Bilateral  Result Date: 01/13/2017 CLINICAL DATA:  Screening. EXAM: 2D DIGITAL SCREENING BILATERAL MAMMOGRAM WITH CAD AND ADJUNCT TOMO COMPARISON:  Previous exam(s). ACR Breast Density Category b: There are scattered areas of fibroglandular density. FINDINGS: There are no findings suspicious for malignancy. Images were processed with CAD. IMPRESSION: No mammographic evidence of malignancy. A result letter of this screening mammogram will be mailed directly to the patient. RECOMMENDATION: Screening mammogram in one year. (Code:SM-B-01Y) BI-RADS CATEGORY  1: Negative. Electronically Signed  By: Lovey Newcomer M.D.   On: 01/13/2017 09:01       Assessment & Plan:   Problem List Items Addressed This Visit    Depression    Doing well on current regimen.  Follow.        Diabetes mellitus ([place])    Low carb diet and exercise.  Follow met b and a1c.        Relevant Orders   CBC with Differential/Platelet   Hemoglobin A1c   Microalbumin / creatinine urine ratio   H/O supraventricular tachycardia    S/p ablation.  Continue f/u with cardiology.  Stable.        Hypercholesteremia    On crestor.  Low cholesterol diet and exercise.  Follow lipid panel and liver function tests.        Relevant Orders   Hepatic function panel   Lipid panel   Hypertension    Blood pressure under good control.  Continue same medication regimen.  Follow pressures.  Follow metabolic panel.        Relevant Orders   TSH   Basic metabolic panel   IBS (irritable bowel syndrome)    Has seen GI.  Had colonoscopy.  Stable.       Renal cell carcinoma ([facility])    S/p left radical nephrectomy and partial adrenalectomy.  Followed by Dr [name].  Stable.  Recommended f/u in 6 months with renal ultrasound and cxr.         Other Visit Diagnoses    Need for immunization against influenza       Relevant Orders   Flu Vaccine QUAD 36+  mos IM (Completed)       Einar Pheasant, MD

## 2017-09-28 ENCOUNTER — Encounter: Payer: Self-pay | Admitting: Internal Medicine

## 2017-09-28 NOTE — Assessment & Plan Note (Signed)
Has seen GI.  Had colonoscopy.  Stable.

## 2017-09-28 NOTE — Assessment & Plan Note (Signed)
Low carb diet and exercise.  Follow met b and a1c.   

## 2017-09-28 NOTE — Assessment & Plan Note (Signed)
On crestor.  Low cholesterol diet and exercise.  Follow lipid panel and liver function tests.   

## 2017-09-28 NOTE — Assessment & Plan Note (Signed)
S/p ablation.  Continue f/u with cardiology.  Stable.

## 2017-09-28 NOTE — Assessment & Plan Note (Signed)
Doing well on current regimen.  Follow.   

## 2017-09-28 NOTE — Assessment & Plan Note (Signed)
S/p left radical nephrectomy and partial adrenalectomy.  Followed by Dr [name].  Stable.  Recommended f/u in 6 months with renal ultrasound and cxr.

## 2017-09-28 NOTE — Assessment & Plan Note (Signed)
Blood pressure under good control.  Continue same medication regimen.  Follow pressures.  Follow metabolic panel.   

## 2017-10-10 DIAGNOSIS — J340 Abscess, furuncle and carbuncle of nose: Secondary | ICD-10-CM | POA: Diagnosis not present

## 2017-10-10 DIAGNOSIS — R040 Epistaxis: Secondary | ICD-10-CM | POA: Diagnosis not present

## 2017-11-28 ENCOUNTER — Other Ambulatory Visit: Payer: Self-pay | Admitting: Internal Medicine

## 2017-11-28 DIAGNOSIS — Z7689 Persons encountering health services in other specified circumstances: Secondary | ICD-10-CM | POA: Diagnosis not present

## 2017-11-28 DIAGNOSIS — E119 Type 2 diabetes mellitus without complications: Secondary | ICD-10-CM | POA: Diagnosis not present

## 2017-11-28 DIAGNOSIS — M25562 Pain in left knee: Secondary | ICD-10-CM | POA: Diagnosis not present

## 2017-11-28 DIAGNOSIS — Z96653 Presence of artificial knee joint, bilateral: Secondary | ICD-10-CM | POA: Diagnosis not present

## 2017-11-28 DIAGNOSIS — E7800 Pure hypercholesterolemia, unspecified: Secondary | ICD-10-CM | POA: Diagnosis not present

## 2017-11-28 DIAGNOSIS — I10 Essential (primary) hypertension: Secondary | ICD-10-CM | POA: Diagnosis not present

## 2017-11-28 NOTE — Telephone Encounter (Signed)
Please advise 

## 2017-11-28 NOTE — Telephone Encounter (Signed)
LOV: 09/26/17  Dr. Nicki Reaper  [facility]  [address] [place]

## 2017-11-28 NOTE — Telephone Encounter (Signed)
Last OV 09/26/2017 Next OV 01/02/2018 Last refill 02/14/2017

## 2017-11-28 NOTE — Telephone Encounter (Signed)
Patient stated she has enough to last through the weekend.

## 2017-11-28 NOTE — Telephone Encounter (Signed)
Copied from [place] [phone]. Topic: Quick Communication - Rx Refill/Question >> Nov 28, 2017  1:11 PM Wynetta Emery, [place] C wrote:   Medication: clonazePAM (KLONOPIN) 0.5 MG tablet   Has the patient contacted their pharmacy? No  (Agent: If no, request that the patient contact the pharmacy for the refill.)  Preferred Pharmacy (with phone number or street name): [place], [place] - [address]  Agent: Please be advised that RX refills may take up to 3 business days. We ask that you follow-up with your pharmacy.

## 2017-11-29 LAB — BASIC METABOLIC PANEL
BUN/Creatinine Ratio: 25 (ref 12–28)
BUN: 26 mg/dL (ref 8–27)
CALCIUM: 9.7 mg/dL (ref 8.7–10.3)
CO2: 24 mmol/L (ref 20–29)
Chloride: 104 mmol/L (ref 96–106)
Creatinine, Ser: 1.06 mg/dL — ABNORMAL HIGH (ref 0.57–1.00)
GFR, EST AFRICAN AMERICAN: 66 mL/min/{1.73_m2} (ref >59)
GFR, EST NON AFRICAN AMERICAN: 57 mL/min/{1.73_m2} — AB (ref >59)
Glucose: 84 mg/dL (ref 65–99)
POTASSIUM: 5.1 mmol/L (ref 3.5–5.2)
Sodium: 141 mmol/L (ref 134–144)

## 2017-11-29 LAB — HEPATIC FUNCTION PANEL
ALBUMIN: 4.3 g/dL (ref 3.6–4.8)
ALK PHOS: 102 IU/L (ref 39–117)
ALT: 14 IU/L (ref 0–32)
AST: 15 IU/L (ref 0–40)
Bilirubin Total: 0.4 mg/dL (ref 0.0–1.2)
Bilirubin, Direct: 0.12 mg/dL (ref 0.00–0.40)
Total Protein: 6.5 g/dL (ref 6.0–8.5)

## 2017-11-29 LAB — CBC WITH DIFFERENTIAL/PLATELET
BASOS ABS: 0 10*3/uL (ref 0.0–0.2)
BASOS: 0 %
EOS (ABSOLUTE): 0.1 10*3/uL (ref 0.0–0.4)
Eos: 2 %
Hematocrit: 39.7 % (ref 34.0–46.6)
Hemoglobin: 13.5 g/dL (ref 11.1–15.9)
IMMATURE GRANS (ABS): 0 10*3/uL (ref 0.0–0.1)
Immature Granulocytes: 0 %
LYMPHS: 27 %
Lymphocytes Absolute: 1.5 10*3/uL (ref 0.7–3.1)
MCH: 28.7 pg (ref 26.6–33.0)
MCHC: 34 g/dL (ref 31.5–35.7)
MCV: 85 fL (ref 79–97)
MONOS ABS: 0.3 10*3/uL (ref 0.1–0.9)
Monocytes: 5 %
NEUTROS ABS: 3.8 10*3/uL (ref 1.4–7.0)
Neutrophils: 66 %
PLATELETS: 207 10*3/uL (ref 150–379)
RBC: 4.7 x10E6/uL (ref 3.77–5.28)
RDW: 13.8 % (ref 12.3–15.4)
WBC: 5.7 10*3/uL (ref 3.4–10.8)

## 2017-11-29 LAB — LIPID PANEL
CHOL/HDL RATIO: 3.1 ratio (ref 0.0–4.4)
CHOLESTEROL TOTAL: 166 mg/dL (ref 100–199)
HDL: 53 mg/dL (ref >39)
LDL Calculated: 92 mg/dL (ref 0–99)
TRIGLYCERIDES: 106 mg/dL (ref 0–149)
VLDL Cholesterol Cal: 21 mg/dL (ref 5–40)

## 2017-11-29 LAB — HEMOGLOBIN A1C
Est. average glucose Bld gHb Est-mCnc: 111 mg/dL
HEMOGLOBIN A1C: 5.5 % (ref 4.8–5.6)

## 2017-11-29 LAB — MICROALBUMIN / CREATININE URINE RATIO
Creatinine, Urine: 146.1 mg/dL
MICROALBUM., U, RANDOM: 26.3 ug/mL
Microalb/Creat Ratio: 18 mg/g creat (ref 0.0–30.0)

## 2017-11-29 LAB — TSH: TSH: 1.37 u[IU]/mL (ref 0.450–4.500)

## 2017-12-01 ENCOUNTER — Other Ambulatory Visit: Payer: Self-pay | Admitting: Internal Medicine

## 2017-12-01 DIAGNOSIS — E875 Hyperkalemia: Secondary | ICD-10-CM

## 2017-12-01 NOTE — Progress Notes (Signed)
Order placed for f/u potassium.  

## 2017-12-02 ENCOUNTER — Other Ambulatory Visit: Payer: Self-pay | Admitting: Internal Medicine

## 2017-12-02 NOTE — Telephone Encounter (Signed)
faxed

## 2017-12-05 ENCOUNTER — Telehealth: Payer: Self-pay | Admitting: *Deleted

## 2017-12-05 DIAGNOSIS — E875 Hyperkalemia: Secondary | ICD-10-CM

## 2017-12-05 NOTE — Telephone Encounter (Signed)
Lab order placed for [facility]

## 2017-12-29 ENCOUNTER — Telehealth: Payer: Self-pay | Admitting: Gastroenterology

## 2017-12-29 NOTE — Telephone Encounter (Signed)
Pt left vm for Ginger she has emailed Ginger some forms from [employer] to fill out pt can be rteached at [phone]

## 2017-12-29 NOTE — Telephone Encounter (Signed)
Received FMLA paperwork via email to complete. Pt notified I have received the information.

## 2018-01-02 ENCOUNTER — Ambulatory Visit: Payer: 59 | Admitting: Internal Medicine

## 2018-01-02 VITALS — BP 126/78 | HR 52 | Temp 97.9°F | Resp 18 | Ht 70.0 in | Wt 226.2 lb

## 2018-01-02 DIAGNOSIS — C642 Malignant neoplasm of left kidney, except renal pelvis: Secondary | ICD-10-CM

## 2018-01-02 DIAGNOSIS — I10 Essential (primary) hypertension: Secondary | ICD-10-CM

## 2018-01-02 DIAGNOSIS — E7800 Pure hypercholesterolemia, unspecified: Secondary | ICD-10-CM | POA: Diagnosis not present

## 2018-01-02 DIAGNOSIS — Z0000 Encounter for general adult medical examination without abnormal findings: Secondary | ICD-10-CM

## 2018-01-02 DIAGNOSIS — R5383 Other fatigue: Secondary | ICD-10-CM | POA: Diagnosis not present

## 2018-01-02 DIAGNOSIS — D649 Anemia, unspecified: Secondary | ICD-10-CM | POA: Diagnosis not present

## 2018-01-02 DIAGNOSIS — R001 Bradycardia, unspecified: Secondary | ICD-10-CM

## 2018-01-02 DIAGNOSIS — K589 Irritable bowel syndrome without diarrhea: Secondary | ICD-10-CM

## 2018-01-02 DIAGNOSIS — F32A Depression, unspecified: Secondary | ICD-10-CM

## 2018-01-02 DIAGNOSIS — E119 Type 2 diabetes mellitus without complications: Secondary | ICD-10-CM | POA: Diagnosis not present

## 2018-01-02 DIAGNOSIS — F329 Major depressive disorder, single episode, unspecified: Secondary | ICD-10-CM

## 2018-01-02 DIAGNOSIS — I471 Supraventricular tachycardia, unspecified: Secondary | ICD-10-CM

## 2018-01-02 DIAGNOSIS — R748 Abnormal levels of other serum enzymes: Secondary | ICD-10-CM

## 2018-01-02 DIAGNOSIS — M799 Soft tissue disorder, unspecified: Secondary | ICD-10-CM | POA: Diagnosis not present

## 2018-01-02 DIAGNOSIS — M7989 Other specified soft tissue disorders: Secondary | ICD-10-CM

## 2018-01-02 NOTE — Assessment & Plan Note (Addendum)
Physical today 01/02/18.  PAP 03/2016.  Mammogram 01/13/17 - birads I.  Schedule f/u mammogram.  Colonoscopy 03/2016 - one 49mm polyp.

## 2018-01-02 NOTE — Progress Notes (Signed)
Patient ID: Jacqueline Nichols, female   DOB: June 25, 1956, 62 y.o.   MRN: [id number]   Subjective:    Patient ID: Jacqueline Nichols, female    DOB: 1956/02/11, 62 y.o.   MRN: [id number]  HPI  Patient here for her physical exam.  Saw ortho 11/28/17 for left knee pain.  Was given prednisone taper.  Reports some increased fatigue.  No chest pain.  No increased heart rate.  On ziac.  Discussed low heart rate.  She wants a trial of cutting the medication in 1/2.  No abdominal pain.  Bowels moving.  Has persistent raised area/lump on her right arm and left leg.  Request referral to surgery for evaluation.     Past Medical History:  Diagnosis Date  . Cancer ([place])    skin  . Chicken pox   . Colon polyps   . Depression   . Diabetes mellitus ([place])   . GERD (gastroesophageal reflux disease)   . Hyperlipidemia   . Hypertension   . 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 Urinary incontinence    Past Surgical History:  Procedure Laterality Date  . CHOLECYSTECTOMY  2001  . COLONOSCOPY WITH PROPOFOL N/A 03/27/2016   Procedure: COLONOSCOPY WITH PROPOFOL;  Surgeon: Lucilla Lame, MD;  Location: [facility] ENDOSCOPY;  Service: Endoscopy;  Laterality: N/A;  . KNEE SURGERY  2003-2009   s/p 8 knee surgeries (with 3 knee replacements)   Family History  Problem Relation Age of Onset  . Colon cancer Mother        died - age 70  . Heart disease Father   . Hypertension Father   . Arthritis/Rheumatoid Sister   . Lung cancer Brother        smoked  . Arthritis/Rheumatoid Paternal Grandmother   . Breast cancer Neg Hx    Social History   Socioeconomic History  . Marital status: Single    Spouse name: Not on file  . Number of children: Not on file  . Years of education: Not on file  . Highest education level: Not on file  Occupational History  . Not on file  Social Needs  . Financial resource strain: Not on file  . Food insecurity:    Worry: Not on file    Inability: Not on file  . Transportation needs:    Medical: Not  on file    Non-medical: Not on file  Tobacco Use  . Smoking status: Former Research scientist (life sciences)  . Smokeless tobacco: Never Used  . Tobacco comment: Quit Feb 2014  Substance and Sexual Activity  . Alcohol use: Yes    Alcohol/week: 0.0 oz    Comment: social drinking  . Drug use: No  . Sexual activity: Not on file  Lifestyle  . Physical activity:    Days per week: Not on file    Minutes per session: Not on file  . Stress: Not on file  Relationships  . Social connections:    Talks on phone: Not on file    Gets together: Not on file    Attends religious service: Not on file    Active member of club or organization: Not on file    Attends meetings of clubs or organizations: Not on file    Relationship status: Not on file  Other Topics Concern  . Not on file  Social History Narrative  . Not on file    Outpatient Encounter Medications as of 01/02/2018  Medication Sig  . bisoprolol-hydrochlorothiazide (ZIAC) 2.5-6.25 MG tablet  TAKE 1 TABLET BY MOUTH DAILY FOR HIGH BLOOD PRESSURE  . butalbital-acetaminophen-caffeine (FIORICET, ESGIC) 50-325-40 MG tablet TAKE ONE TABLET EACH DAY AS NEEDED FOR HEADACHE  . citalopram (CELEXA) 40 MG tablet TAKE 1 TABLET BY MOUTH EVERY DAY  . clonazePAM (KLONOPIN) 0.5 MG tablet TAKE 1 TABLET BY MOUTH EVERY DAY AS NEEDED.  Marland Kitchen clopidogrel (PLAVIX) 75 MG tablet Take 1 tablet (75 mg total) by mouth daily.  . diphenoxylate-atropine (LOMOTIL) 2.5-0.025 MG tablet Take 1 tablet by mouth 4 (four) times daily as needed for diarrhea or loose stools.  . Loperamide HCl (IMODIUM PO) Take by mouth as needed.  . meloxicam (MOBIC) 7.5 MG tablet TAKE ONE TABLET TWICE DAILY  . Misc Natural Products (ALLERGY RELEAF SYSTEM PO) Take by mouth. Reported on 03/20/2016  . nystatin cream (MYCOSTATIN) Apply 1 application topically 2 (two) times daily.  . rosuvastatin (CRESTOR) 5 MG tablet TAKE 1 TABLET BY MOUTH EVERY DAY  . torsemide (DEMADEX) 20 MG tablet TAKE 1 TABLET BY MOUTH EVERY DAY AS  NEEDED.  . [DISCONTINUED] fluticasone (VERAMYST) 27.5 MCG/SPRAY nasal spray Place 2 sprays into the nose as needed. Reported on 03/20/2016  . [DISCONTINUED] hyoscyamine (ANASPAZ) 0.125 MG TBDP disintergrating tablet Place 0.125 mg under the tongue every 4 (four) hours as needed. Reported on 03/20/2016   No facility-administered encounter medications on file as of 01/02/2018.     Review of Systems  Constitutional: Negative for appetite change and unexpected weight change.  HENT: Negative for congestion and sinus pressure.   Eyes: Negative for pain and visual disturbance.  Respiratory: Negative for cough, chest tightness and shortness of breath.   Cardiovascular: Negative for chest pain, palpitations and leg swelling.  Gastrointestinal: Negative for abdominal pain, diarrhea, nausea and vomiting.  Genitourinary: Negative for difficulty urinating and dysuria.  Musculoskeletal: Negative for joint swelling and myalgias.  Skin: Negative for color change and rash.       Question cyst/lipoma - right arm and left leg.  Non tender.  No increased erythema.   Neurological: Negative for dizziness, light-headedness and headaches.  Hematological: Negative for adenopathy. Does not bruise/bleed easily.  Psychiatric/Behavioral: Negative for agitation and dysphoric mood.       Objective:    Physical Exam  Constitutional: She is oriented to person, place, and time. She appears well-developed and well-nourished. No distress.  HENT:  Nose: Nose normal.  Mouth/Throat: Oropharynx is clear and moist.  Eyes: Right eye exhibits no discharge. Left eye exhibits no discharge. No scleral icterus.  Neck: Neck supple. No thyromegaly present.  Cardiovascular: Normal rate and regular rhythm.  Pulmonary/Chest: Breath sounds normal. No accessory muscle usage. No tachypnea. No respiratory distress. She has no decreased breath sounds. She has no wheezes. She has no rhonchi. Right breast exhibits no inverted nipple, no mass,  no nipple discharge and no tenderness (no axillary adenopathy). Left breast exhibits no inverted nipple, no mass, no nipple discharge and no tenderness (no axilarry adenopathy).  Abdominal: Soft. Bowel sounds are normal. There is no tenderness.  Musculoskeletal: She exhibits no edema or tenderness.  Lymphadenopathy:    She has no cervical adenopathy.  Neurological: She is alert and oriented to person, place, and time.  Skin: No rash noted. No erythema.  Raised soft tissue lesion (cyst/lipoma) - right arm and left leg.  No erythema.  Non tender.    Psychiatric: She has a normal mood and affect. Her behavior is normal.    BP 126/78 (BP Location: Left Arm, Patient Position: Sitting, Cuff Size:  Large)   Pulse (!) 52   Temp 97.9 F (36.6 C) (Oral)   Resp 18   Ht _0  (1.778 m)   Wt 226 lb 3.2 oz (102.6 kg)   SpO2 96%   BMI 32.46 kg/m  Wt Readings from Last 3 Encounters:  01/02/18 226 lb 3.2 oz (102.6 kg)  09/26/17 223 lb 3.2 oz (101.2 kg)  06/20/17 224 lb 6.4 oz (101.8 kg)     Lab Results  Component Value Date   WBC 5.7 11/28/2017   HGB 13.5 11/28/2017   HCT 39.7 11/28/2017   PLT 207 11/28/2017   GLUCOSE 84 11/28/2017   CHOL 166 11/28/2017   TRIG 106 11/28/2017   HDL 53 11/28/2017   LDLCALC 92 11/28/2017   ALT 14 11/28/2017   AST 15 11/28/2017   NA 141 11/28/2017   K 5.1 11/28/2017   CL 104 11/28/2017   CREATININE 1.06 (H) 11/28/2017   BUN 26 11/28/2017   CO2 24 11/28/2017   TSH 1.370 11/28/2017   HGBA1C 5.5 11/28/2017    Mm Screening Breast Tomo Bilateral  Result Date: 01/13/2017 CLINICAL DATA:  Screening. EXAM: 2D DIGITAL SCREENING BILATERAL MAMMOGRAM WITH CAD AND ADJUNCT TOMO COMPARISON:  Previous exam(s). ACR Breast Density Category b: There are scattered areas of fibroglandular density. FINDINGS: There are no findings suspicious for malignancy. Images were processed with CAD. IMPRESSION: No mammographic evidence of malignancy. A result letter of this screening  mammogram will be mailed directly to the patient. RECOMMENDATION: Screening mammogram in one year. (Code:SM-B-01Y) BI-RADS CATEGORY  1: Negative. Electronically Signed   By: Lovey Newcomer M.D.   On: 01/13/2017 09:01       Assessment & Plan:   Problem List Items Addressed This Visit    Anemia    Recent hgb wnl.  Follow.        Bradycardia    Could be contributing to fatigue.  Decrease ziac to 1/2 tablet per day.  Follow.  See if fatigue improves.  Declines further evaluation at this time.  Discussed Holter.        Depression    Doing relatively well on current regimen.  Discussed exercise.  Follow.        Diabetes mellitus ([place])    Low carb diet and exercise.  Follow met b and a1c.        Relevant Orders   Basic metabolic panel   Elevated alkaline phosphatase level    Follow liver panel.       Relevant Orders   Hepatic function panel   Fatigue    Felt to be multifactorial.  Adjust ziac as outlined. Discussed exercise.  Check vitamin B12 and vitamin D level.  Discussed sleep study.  She declines.        Relevant Orders   VITAMIN D 25 Hydroxy (Vit-D Deficiency, Fractures)   Vitamin B12   Health care maintenance    Physical today 01/02/18.  PAP 03/2016.  Mammogram 01/13/17 - birads I.  Schedule f/u mammogram.  Colonoscopy 03/2016 - one 6m polyp.        Hypercholesteremia    On crestor.  Low cholesterol diet and exercise.  Follow lipid panel and liver function tests.        Hypertension    Blood pressure under good control.  Continue same medication regimen.  Follow pressures.  Follow metabolic panel.        IBS (irritable bowel syndrome)    Stable.       Renal  cell carcinoma [facility])    S/p left radical nephrectomy and partial adrenalectomy.  Followed by Dr [name].  Stable.  Recommended f/u 6 month renal ultrasound and cxr (due 03/2018).        SVT (supraventricular tachycardia) ([place])    Doing well.  No increased heart rate or palpitations.  Follow.         Other Visit  Diagnoses    Soft tissue mass    -  Primary   Relevant Orders   Ambulatory referral to General Surgery   Routine general medical examination at a health care facility           Einar Pheasant, MD

## 2018-01-05 ENCOUNTER — Encounter: Payer: Self-pay | Admitting: Internal Medicine

## 2018-01-05 DIAGNOSIS — I471 Supraventricular tachycardia: Secondary | ICD-10-CM | POA: Insufficient documentation

## 2018-01-05 NOTE — Assessment & Plan Note (Signed)
Stable

## 2018-01-05 NOTE — Assessment & Plan Note (Signed)
Low carb diet and exercise.  Follow met b and a1c.   

## 2018-01-05 NOTE — Assessment & Plan Note (Signed)
On crestor.  Low cholesterol diet and exercise.  Follow lipid panel and liver function tests.   

## 2018-01-05 NOTE — Assessment & Plan Note (Signed)
Follow liver panel.  

## 2018-01-05 NOTE — Assessment & Plan Note (Signed)
Could be contributing to fatigue.  Decrease ziac to 1/2 tablet per day.  Follow.  See if fatigue improves.  Declines further evaluation at this time.  Discussed Holter.

## 2018-01-05 NOTE — Assessment & Plan Note (Signed)
Doing relatively well on current regimen.  Discussed exercise.  Follow.

## 2018-01-05 NOTE — Assessment & Plan Note (Signed)
Recent hgb wnl.  Follow.   

## 2018-01-05 NOTE — Assessment & Plan Note (Signed)
Doing well.  No increased heart rate or palpitations.  Follow.

## 2018-01-05 NOTE — Assessment & Plan Note (Signed)
S/p left radical nephrectomy and partial adrenalectomy.  Followed by Dr [name].  Stable.  Recommended f/u 6 month renal ultrasound and cxr (due 03/2018).

## 2018-01-05 NOTE — Assessment & Plan Note (Signed)
Felt to be multifactorial.  Adjust ziac as outlined. Discussed exercise.  Check vitamin B12 and vitamin D level.  Discussed sleep study.  She declines.

## 2018-01-05 NOTE — Assessment & Plan Note (Signed)
Blood pressure under good control.  Continue same medication regimen.  Follow pressures.  Follow metabolic panel.   

## 2018-01-06 ENCOUNTER — Other Ambulatory Visit: Payer: Self-pay | Admitting: Internal Medicine

## 2018-01-06 DIAGNOSIS — Z1231 Encounter for screening mammogram for malignant neoplasm of breast: Secondary | ICD-10-CM

## 2018-01-07 ENCOUNTER — Other Ambulatory Visit: Payer: Self-pay | Admitting: Internal Medicine

## 2018-01-21 ENCOUNTER — Telehealth: Payer: Self-pay

## 2018-01-21 NOTE — Telephone Encounter (Signed)
Copied from [place] [phone]. Topic: Referral - Status >> Jan 21, 2018  4:25 PM Bea Graff, NT wrote: Reason for CRM: Pt calling to check status on the referral being sent to [facility] for right upper arm. May leave message.

## 2018-01-29 ENCOUNTER — Encounter (INDEPENDENT_AMBULATORY_CARE_PROVIDER_SITE_OTHER): Payer: Self-pay

## 2018-01-29 NOTE — Telephone Encounter (Signed)
Patient still has not heard from [place] but it looks like the referral has been approved?

## 2018-02-03 ENCOUNTER — Telehealth: Payer: Self-pay

## 2018-02-03 NOTE — Telephone Encounter (Signed)
Copied from [place] [phone]. Topic: Referral - Request >> Feb 03, 2018  2:26 PM Pricilla Handler wrote: Reason for CRM: Patient called requesting a call from Einar Pheasant concerning a referral request. Patient's oncologist Dr. [name] has left town, and the patient would like a referral from [employer] of a new cancer program. I recommended the [facility]. Patient states that she wants Charlene Scott's opinion first. Patient wants a call back asap.       Thank You!!!

## 2018-02-04 NOTE — Telephone Encounter (Signed)
I can get her scheduled with oncology for f/u is she is ok with this.  Just let me know and I will place the order for the referral.

## 2018-02-04 NOTE — Telephone Encounter (Signed)
Tried to reach pt on mobile number (mailbox full). Okay for P[facility] to give message if patient calls back.

## 2018-02-04 NOTE — Telephone Encounter (Signed)
Tried to reach patient by phone. Mailbox is full

## 2018-02-04 NOTE — Telephone Encounter (Signed)
Called patient to clarify msg below. Pt was being followed by Dr. [name] for kidney cancer. He is moving out of state so patient is wondering if she should be referred to urology or oncology? Pt stated it is time for her 6 month follow up so she needs to get in with another doctor as soon as possible.

## 2018-02-04 NOTE — Telephone Encounter (Signed)
Patient Is wanting to know why Dr. Nicki Reaper said oncology since she does not have cancer anymore.  She stated her urologist is the one who left not oncologist.   She would like Dr. Lars Mage opinion on who to see for the mass in kidney.

## 2018-02-06 DIAGNOSIS — R5383 Other fatigue: Secondary | ICD-10-CM | POA: Diagnosis not present

## 2018-02-06 DIAGNOSIS — E119 Type 2 diabetes mellitus without complications: Secondary | ICD-10-CM | POA: Diagnosis not present

## 2018-02-06 DIAGNOSIS — R748 Abnormal levels of other serum enzymes: Secondary | ICD-10-CM | POA: Diagnosis not present

## 2018-02-06 NOTE — Telephone Encounter (Signed)
Pt would like referral to oncology

## 2018-02-07 LAB — BASIC METABOLIC PANEL
BUN/Creatinine Ratio: 22 (ref 12–28)
BUN: 21 mg/dL (ref 8–27)
CALCIUM: 9.4 mg/dL (ref 8.7–10.3)
CO2: 25 mmol/L (ref 20–29)
CREATININE: 0.94 mg/dL (ref 0.57–1.00)
Chloride: 100 mmol/L (ref 96–106)
GFR, EST AFRICAN AMERICAN: 76 mL/min/{1.73_m2} (ref >59)
GFR, EST NON AFRICAN AMERICAN: 66 mL/min/{1.73_m2} (ref >59)
Glucose: 90 mg/dL (ref 65–99)
Potassium: 4.3 mmol/L (ref 3.5–5.2)
Sodium: 138 mmol/L (ref 134–144)

## 2018-02-07 LAB — HEPATIC FUNCTION PANEL
ALBUMIN: 4.3 g/dL (ref 3.6–4.8)
ALK PHOS: 88 IU/L (ref 39–117)
ALT: 13 IU/L (ref 0–32)
AST: 15 IU/L (ref 0–40)
BILIRUBIN TOTAL: 0.4 mg/dL (ref 0.0–1.2)
BILIRUBIN, DIRECT: 0.11 mg/dL (ref 0.00–0.40)
TOTAL PROTEIN: 6.3 g/dL (ref 6.0–8.5)

## 2018-02-07 LAB — VITAMIN B12: Vitamin B-12: 256 pg/mL (ref 232–1245)

## 2018-02-07 LAB — VITAMIN D 25 HYDROXY (VIT D DEFICIENCY, FRACTURES): Vit D, 25-Hydroxy: 11 ng/mL — ABNORMAL LOW (ref 30.0–100.0)

## 2018-02-08 ENCOUNTER — Other Ambulatory Visit: Payer: Self-pay | Admitting: Internal Medicine

## 2018-02-08 DIAGNOSIS — C642 Malignant neoplasm of left kidney, except renal pelvis: Secondary | ICD-10-CM

## 2018-02-08 NOTE — Progress Notes (Signed)
Order placed for oncology referral

## 2018-02-08 NOTE — Telephone Encounter (Signed)
Order placed for oncology referral

## 2018-02-10 ENCOUNTER — Ambulatory Visit
Admission: RE | Admit: 2018-02-10 | Discharge: 2018-02-10 | Disposition: A | Discharge status: Home / Self Care | Payer: 59 | Source: Ambulatory Visit | Attending: Internal Medicine | Admitting: Internal Medicine

## 2018-02-10 DIAGNOSIS — Z1231 Encounter for screening mammogram for malignant neoplasm of breast: Secondary | ICD-10-CM | POA: Insufficient documentation

## 2018-02-13 ENCOUNTER — Other Ambulatory Visit: Payer: Self-pay | Admitting: Gastroenterology

## 2018-02-13 ENCOUNTER — Telehealth: Payer: Self-pay

## 2018-02-13 ENCOUNTER — Other Ambulatory Visit: Payer: Self-pay

## 2018-02-13 MED ORDER — VITAMIN D (ERGOCALCIFEROL) 1.25 MG (50000 UNIT) PO CAPS: 50000.0000 [IU] | ORAL_CAPSULE | ORAL | 0 refills | Status: DC

## 2018-02-13 NOTE — Telephone Encounter (Signed)
Pt aware of results 

## 2018-02-13 NOTE — Telephone Encounter (Signed)
Copied from [place] [phone]. Topic: General - Other >> Feb 13, 2018  9:28 AM Keene Breath wrote: Reason for CRM: Patient called to ask if she needed to buy an over the counter vitamin D supplement since her labs indicated that she was vitamin D deficient.  She is very tired all the time and wanted to know what she should do if anything.  MM#[phone] Work #.  If she is not there, please leave a voice mail message.

## 2018-02-17 ENCOUNTER — Other Ambulatory Visit: Payer: Self-pay

## 2018-02-17 MED ORDER — DIPHENOXYLATE-ATROPINE 2.5-0.025 MG PO TABS: 1.0000 | ORAL_TABLET | Freq: Four times a day (QID) | ORAL | 5 refills | Status: DC | PRN

## 2018-02-20 ENCOUNTER — Inpatient Hospital Stay: Payer: 59 | Admitting: Oncology

## 2018-02-20 ENCOUNTER — Inpatient Hospital Stay: Payer: 59 | Attending: Oncology | Admitting: Oncology

## 2018-02-20 ENCOUNTER — Other Ambulatory Visit: Payer: Self-pay

## 2018-02-20 ENCOUNTER — Encounter: Payer: Self-pay | Admitting: Oncology

## 2018-02-20 VITALS — BP 109/75 | HR 59 | Resp 16 | Ht 69.0 in | Wt 228.5 lb

## 2018-02-20 DIAGNOSIS — Z85528 Personal history of other malignant neoplasm of kidney: Secondary | ICD-10-CM

## 2018-02-20 DIAGNOSIS — D509 Iron deficiency anemia, unspecified: Secondary | ICD-10-CM

## 2018-02-20 MED ORDER — FERROUS SULFATE 325 (65 FE) MG PO TBEC: 325.0000 mg | DELAYED_RELEASE_TABLET | Freq: Two times a day (BID) | ORAL | 2 refills | Status: DC

## 2018-02-20 NOTE — Progress Notes (Signed)
Hematology/Oncology Consult note [facility] Telephone:([phone] [phone]   Patient Care Team: Einar Pheasant, MD as PCP - General (Internal Medicine)  REFERRING PROVIDER: Einar Pheasant, MD CHIEF COMPLAINTS/REASON FOR VISIT:  Evaluation of renal cell carcinoma  HISTORY OF PRESENTING ILLNESS:  Jacqueline Nichols is a  62 y.o.  female with PMH listed below who was referred to me for evaluation of history of renal cell carcinoma Patient follows up with [facility] urology and want to switch her care to local. Extensive medical chart review was performed by me. History of Stage II left renal cell carcinoma.  In 2016, she was found to have a left renal mass 7.9 cm x 2.6 cm x 9.3 cm.  Underwent laparoscopic left radical nephrectomy with Dr.Matthew Reyna on 07/18/2015.  Per Note in care everywhere, patient has clear cell renal cell carcinoma, ISU P nuclear grade 2, pT2a, margins of resections are negative for tumor, no metastatic carcinoma identified in 1 hilar lymph nodes (0/1), background kidney with scattered sclerotic glomeruli; histologically unremarkable adrenal gland.  Patient is currently 2 and 1/2 years after surgery.  She has been having surveillance ultrasound every 6 months.  Today patient denies any cough, weight loss, hematuria, flank pain. Former smoker  Review of Systems  Constitutional: Negative for chills, fever, malaise/fatigue and weight loss.  HENT: Negative for congestion, ear discharge, ear pain, nosebleeds, sinus pain and sore throat.   Eyes: Negative for double vision, photophobia, pain, discharge and redness.  Respiratory: Negative for cough, hemoptysis, sputum production, shortness of breath and wheezing.   Cardiovascular: Negative for chest pain, palpitations, orthopnea, claudication and leg swelling.  Gastrointestinal: Negative for abdominal pain, blood in stool, constipation, diarrhea, heartburn, melena, nausea and vomiting.    Genitourinary: Negative for dysuria, flank pain, frequency and hematuria.  Musculoskeletal: Negative for back pain, myalgias and neck pain.  Skin: Negative for itching and rash.  Neurological: Negative for dizziness, tingling, tremors, focal weakness, weakness and headaches.  Endo/Heme/Allergies: Negative for environmental allergies. Does not bruise/bleed easily.  Psychiatric/Behavioral: Negative for depression and hallucinations. The patient is not nervous/anxious.     MEDICAL HISTORY:  Past Medical History:  Diagnosis Date  . Cancer ([place])    skin  . Chicken pox   . Colon polyps   . Depression   . Diabetes mellitus ([place])   . GERD (gastroesophageal reflux disease)   . Hyperlipidemia   . Hypertension   . 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 Urinary incontinence     SURGICAL HISTORY: Past Surgical History:  Procedure Laterality Date  . CHOLECYSTECTOMY  2001  . COLONOSCOPY WITH PROPOFOL N/A 03/27/2016   Procedure: COLONOSCOPY WITH PROPOFOL;  Surgeon: Lucilla Lame, MD;  Location: [facility] ENDOSCOPY;  Service: Endoscopy;  Laterality: N/A;  . KNEE SURGERY  2003-2009   s/p 8 knee surgeries (with 3 knee replacements)    SOCIAL HISTORY: Social History   Socioeconomic History  . Marital status: Single    Spouse name: Not on file  . Number of children: Not on file  . Years of education: Not on file  . Highest education level: Not on file  Occupational History  . Not on file  Social Needs  . Financial resource strain: Not on file  . Food insecurity:    Worry: Not on file    Inability: Not on file  . Transportation needs:    Medical: Not on file    Non-medical: Not on file  Tobacco Use  . Smoking status:  Former Smoker  . Smokeless tobacco: Never Used  . Tobacco comment: Quit Feb 2014  Substance and Sexual Activity  . Alcohol use: Yes    Alcohol/week: 0.0 oz    Comment: social drinking  . Drug use: No  . Sexual activity: Not on file  Lifestyle  . Physical activity:    Days per  week: Not on file    Minutes per session: Not on file  . Stress: Not on file  Relationships  . Social connections:    Talks on phone: Not on file    Gets together: Not on file    Attends religious service: Not on file    Active member of club or organization: Not on file    Attends meetings of clubs or organizations: Not on file    Relationship status: Not on file  . Intimate partner violence:    Fear of current or ex partner: Not on file    Emotionally abused: Not on file    Physically abused: Not on file    Forced sexual activity: Not on file  Other Topics Concern  . Not on file  Social History Narrative  . Not on file    FAMILY HISTORY: Family History  Problem Relation Age of Onset  . Colon cancer Mother        died - age 84  . Heart disease Father   . Hypertension Father   . Arthritis/Rheumatoid Sister   . Lung cancer Brother        smoked  . Arthritis/Rheumatoid Paternal Grandmother   . Breast cancer Neg Hx     ALLERGIES:  is allergic to codeine and vicodin [hydrocodone-acetaminophen].  MEDICATIONS:  Current Outpatient Medications  Medication Sig Dispense Refill  . bisoprolol-hydrochlorothiazide (ZIAC) 2.5-6.25 MG tablet TAKE 1 TABLET BY MOUTH DAILY FOR HIGH BLOOD PRESSURE 90 tablet 1  . butalbital-acetaminophen-caffeine (FIORICET, ESGIC) 50-325-40 MG tablet TAKE ONE TABLET EACH DAY AS NEEDED FOR HEADACHE 15 tablet 0  . citalopram (CELEXA) 40 MG tablet TAKE 1 TABLET BY MOUTH EVERY DAY 90 tablet 1  . clonazePAM (KLONOPIN) 0.5 MG tablet TAKE 1 TABLET BY MOUTH EVERY DAY AS NEEDED. 30 tablet 0  . clopidogrel (PLAVIX) 75 MG tablet TAKE 1 TABLET BY MOUTH EVERY DAY 90 tablet 1  . diphenoxylate-atropine (LOMOTIL) 2.5-0.025 MG tablet Take 1 tablet by mouth 4 (four) times daily as needed for diarrhea or loose stools. 120 tablet 5  . Loperamide HCl (IMODIUM PO) Take by mouth as needed.    . meloxicam (MOBIC) 7.5 MG tablet TAKE ONE TABLET TWICE DAILY 60 tablet 5  . Misc  Natural Products (ALLERGY RELEAF SYSTEM PO) Take by mouth. Reported on 03/20/2016    . nystatin cream (MYCOSTATIN) Apply 1 application topically 2 (two) times daily. 30 g 0  . rosuvastatin (CRESTOR) 5 MG tablet TAKE 1 TABLET BY MOUTH EVERY DAY 90 tablet 1  . torsemide (DEMADEX) 20 MG tablet TAKE 1 TABLET BY MOUTH EVERY DAY AS NEEDED. 90 tablet 1  . Vitamin D, Ergocalciferol, (DRISDOL) 50000 units CAPS capsule Take 1 capsule (50,000 Units total) by mouth every 7 (seven) days. 12 capsule 0   No current facility-administered medications for this visit.      PHYSICAL EXAMINATION: ECOG PERFORMANCE STATUS: 0 - Asymptomatic Vitals:   02/20/18 1158  BP: 109/75  Pulse: (!) 59  Resp: 16   Filed Weights   02/20/18 1158  Weight: 228 lb 8 oz (103.6 kg)    Physical Exam  Constitutional:  She is oriented to person, place, and time. She appears well-developed and well-nourished. No distress.  HENT:  Head: Normocephalic and atraumatic.  Right Ear: External ear normal.  Left Ear: External ear normal.  Mouth/Throat: Oropharynx is clear and moist.  Eyes: Pupils are equal, round, and reactive to light. Conjunctivae and EOM are normal. No scleral icterus.  Neck: Normal range of motion. Neck supple.  Cardiovascular: Normal rate, regular rhythm and normal heart sounds.  Pulmonary/Chest: Effort normal and breath sounds normal. No respiratory distress. She has no wheezes. She has no rales. She exhibits no tenderness.  Abdominal: Soft. Bowel sounds are normal. She exhibits no distension and no mass. There is no tenderness.  Musculoskeletal: Normal range of motion. She exhibits no edema or deformity.  Lymphadenopathy:    She has no cervical adenopathy.  Neurological: She is alert and oriented to person, place, and time. No cranial nerve deficit. Coordination normal.  Skin: Skin is warm and dry. No rash noted.  Psychiatric: She has a normal mood and affect. Her behavior is normal. Thought content normal.      LABORATORY DATA:  I have reviewed the data as listed Lab Results  Component Value Date   WBC 5.7 11/28/2017   HGB 13.5 11/28/2017   HCT 39.7 11/28/2017   MCV 85 11/28/2017   PLT 207 11/28/2017   Recent Labs    11/28/17 1130 02/06/18 1444  NA 141 138  K 5.1 4.3  CL 104 100  CO2 24 25  GLUCOSE 84 90  BUN 26 21  CREATININE 1.06* 0.94  CALCIUM 9.7 9.4  GFRNONAA 57* 66  GFRAA 66 76  PROT 6.5 6.3  ALBUMIN 4.3 4.3  AST 15 15  ALT 14 13  ALKPHOS 102 88  BILITOT 0.4 0.4  BILIDIR 0.12 0.11   Iron/TIBC/Ferritin/ %Sat No results found for: IRON, TIBC, FERRITIN, IRONPCTSAT   Reviewed lab work which was done at PCPs office on 02/03/2018. Creatinine 0.83, sodium 139, potassium 4.3, calcium 9.7, bilirubin 0.4, AST 14 ALT 11 WBC 6, hemoglobin 10.9, MCV 79, platelet 223,000, normal differential. Ferritin 11  ASSESSMENT & PLAN:  1. History of kidney cancer   2. Iron deficiency anemia, unspecified iron deficiency anemia type    # History of Stage II renal cell carcinoma, per NCCN guideline,  Suggests follow up every 3-6 months for 3 years, then annually for 5 years.  CBC CMP every 6 months x 2 y, then annually.  Abd Image every 6 months x 3 years and then annually up to 5 years Chest CT every 6 months x 3 years then annually.   Recommend CT chest and abdomen pelvis wo contrast for survalliance.   # Iron deficiency anemia Recommend start taking ferrous sulfate 325mg  BID. Repeat cbc, iron TIBC ferritin in 3 months to check response.  Orders Placed This Encounter  Procedures  . CT Chest Wo Contrast    Standing Status:   Future    Standing Expiration Date:   02/20/2019    Order Specific Question:   Is patient pregnant?    Answer:   No    Order Specific Question:   Preferred imaging location?    Answer:   [facility]    Order Specific Question:   Radiology Contrast Protocol - do NOT remove file path    Answer:   \\[email or web address]  . CT  Abdomen Pelvis Wo Contrast    Standing Status:   Future    Standing Expiration Date:   02/20/2019  Order Specific Question:   Is patient pregnant?    Answer:   No    Order Specific Question:   Preferred imaging location?    Answer:   [facility]    Order Specific Question:   Is Oral Contrast requested for this exam?    Answer:   Yes, Per Radiology protocol    Order Specific Question:   Radiology Contrast Protocol - do NOT remove file path    Answer:   \\[email or web address]  . CBC with Differential/Platelet    Standing Status:   Future    Standing Expiration Date:   02/21/2019  . Iron and TIBC    Standing Status:   Future    Standing Expiration Date:   02/21/2019  . Ferritin    Standing Status:   Future    Standing Expiration Date:   02/21/2019    All questions were answered. The patient knows to call the clinic with any problems questions or concerns.  Return of visit: 3 months. Thank you for this kind referral and the opportunity to participate in the care of this patient. A copy of today's note is routed to referring provider  Total face to face encounter time for this patient visit was 60 min, >50% of the time was  spent in counseling and coordination of care.    [name], MD, PhD Hematology Oncology [facility] Pager- [phone] 02/20/2018

## 2018-02-20 NOTE — Progress Notes (Signed)
Patient here today as a new patient  

## 2018-02-26 ENCOUNTER — Ambulatory Visit: Payer: 59

## 2018-02-27 ENCOUNTER — Ambulatory Visit
Admission: RE | Admit: 2018-02-27 | Discharge: 2018-02-27 | Disposition: A | Discharge status: Home / Self Care | Payer: 59 | Source: Ambulatory Visit | Attending: Oncology | Admitting: Oncology

## 2018-02-27 ENCOUNTER — Ambulatory Visit: Payer: 59

## 2018-02-27 DIAGNOSIS — R918 Other nonspecific abnormal finding of lung field: Secondary | ICD-10-CM | POA: Diagnosis not present

## 2018-02-27 DIAGNOSIS — Z905 Acquired absence of kidney: Secondary | ICD-10-CM | POA: Diagnosis not present

## 2018-02-27 DIAGNOSIS — I700 Atherosclerosis of aorta: Secondary | ICD-10-CM | POA: Diagnosis not present

## 2018-02-27 DIAGNOSIS — Z9049 Acquired absence of other specified parts of digestive tract: Secondary | ICD-10-CM | POA: Insufficient documentation

## 2018-02-27 DIAGNOSIS — C642 Malignant neoplasm of left kidney, except renal pelvis: Secondary | ICD-10-CM | POA: Diagnosis not present

## 2018-02-27 DIAGNOSIS — Z85528 Personal history of other malignant neoplasm of kidney: Secondary | ICD-10-CM | POA: Insufficient documentation

## 2018-03-10 ENCOUNTER — Telehealth: Payer: Self-pay

## 2018-03-10 NOTE — Telephone Encounter (Signed)
Patient is willing to leave it at 3

## 2018-03-10 NOTE — Telephone Encounter (Signed)
Copied from [place] [id number]. Topic: Appointment Scheduling - Scheduling Inquiry for Clinic >> Mar 10, 2018 11:18 AM Margot Ables wrote: Reason for CRM: pt is scheduled for b12 Friday 03/13/18 at 3. She needs to come between 11-2 Friday. Could this be changed for her. Please call her work # [phone].

## 2018-03-13 ENCOUNTER — Ambulatory Visit (INDEPENDENT_AMBULATORY_CARE_PROVIDER_SITE_OTHER): Payer: 59 | Admitting: *Deleted

## 2018-03-13 ENCOUNTER — Ambulatory Visit: Payer: 59

## 2018-03-13 DIAGNOSIS — E538 Deficiency of other specified B group vitamins: Secondary | ICD-10-CM | POA: Diagnosis not present

## 2018-03-13 MED ORDER — CYANOCOBALAMIN 1000 MCG/ML IJ SOLN
1000.0000 ug | Freq: Once | INTRAMUSCULAR | Status: AC
  Administered 2018-03-13: 1000 ug via INTRAMUSCULAR

## 2018-03-13 NOTE — Progress Notes (Signed)
Per orders of Dr. Einar Pheasant injection of B-12 given today by Tressie Ellis. Patient tolerated injection well.

## 2018-03-17 ENCOUNTER — Ambulatory Visit: Payer: 59

## 2018-03-27 ENCOUNTER — Encounter: Payer: Self-pay | Admitting: Internal Medicine

## 2018-03-27 ENCOUNTER — Ambulatory Visit: Payer: 59 | Admitting: Internal Medicine

## 2018-03-27 ENCOUNTER — Telehealth: Payer: Self-pay | Admitting: Internal Medicine

## 2018-03-27 VITALS — BP 110/76 | HR 55 | Temp 98.0°F | Resp 18 | Wt 229.0 lb

## 2018-03-27 DIAGNOSIS — F329 Major depressive disorder, single episode, unspecified: Secondary | ICD-10-CM

## 2018-03-27 DIAGNOSIS — C642 Malignant neoplasm of left kidney, except renal pelvis: Secondary | ICD-10-CM

## 2018-03-27 DIAGNOSIS — D509 Iron deficiency anemia, unspecified: Secondary | ICD-10-CM | POA: Diagnosis not present

## 2018-03-27 DIAGNOSIS — R350 Frequency of micturition: Secondary | ICD-10-CM | POA: Diagnosis not present

## 2018-03-27 DIAGNOSIS — E538 Deficiency of other specified B group vitamins: Secondary | ICD-10-CM

## 2018-03-27 DIAGNOSIS — K589 Irritable bowel syndrome without diarrhea: Secondary | ICD-10-CM

## 2018-03-27 DIAGNOSIS — Z8679 Personal history of other diseases of the circulatory system: Secondary | ICD-10-CM

## 2018-03-27 DIAGNOSIS — E119 Type 2 diabetes mellitus without complications: Secondary | ICD-10-CM

## 2018-03-27 DIAGNOSIS — E7800 Pure hypercholesterolemia, unspecified: Secondary | ICD-10-CM

## 2018-03-27 DIAGNOSIS — I10 Essential (primary) hypertension: Secondary | ICD-10-CM

## 2018-03-27 DIAGNOSIS — F32A Depression, unspecified: Secondary | ICD-10-CM

## 2018-03-27 MED ORDER — "NEEDLE (DISP) 25G X 1"" MISC": 11 refills | Status: DC

## 2018-03-27 MED ORDER — CYANOCOBALAMIN 1000 MCG/ML IJ SOLN: INTRAMUSCULAR | 11 refills | Status: DC

## 2018-03-27 MED ORDER — CYANOCOBALAMIN 1000 MCG/ML IJ SOLN
1000.0000 ug | Freq: Once | INTRAMUSCULAR | Status: AC
  Administered 2018-03-27: 1000 ug via INTRAMUSCULAR

## 2018-03-27 MED ORDER — "SYRINGE/NEEDLE (DISP) 25G X 5/8"" 3 ML MISC": 11 refills | Status: DC

## 2018-03-27 NOTE — Progress Notes (Signed)
Patient ID: Jacqueline Nichols, female   DOB: [birth year], 62 y.o.   MRN: [id number]   Subjective:    Patient ID: Jacqueline Nichols, female    DOB: [birth year], 62 y.o.   MRN: [id number]  HPI  Patient here for a scheduled follow up.  She reports she has noticed some increased urinary frequency and odor - approximately 5-6 days ago.  Urine was darker.   Some urgency.  Started drinking more.  Symptoms improved now.  Wants urine checked to confirm no infection.  Eating.  No abdominal pain.  Bowels moving.  No chest pain.  Breathing stable.  No cough or congestion.  No acid reflux reported.  Just was Dr Tasia Catchings for f/u renal cell carcinoma.  Note reviewed.  Recommended abdominal imaging and chest CT every 6 months x 3 years.  Also Dr Tasia Catchings discussed iron deficiency.  Had wanted pt to start iron supplements.  She has not started yet.  Wants labs checked.  Handling stress.  Also needs form completed for work - regarding her weight.  Have discussed diet and exercise.     Past Medical History:  Diagnosis Date  . Cancer ([place])    skin  . Chicken pox   . Colon polyps   . Depression   . Diabetes mellitus ([place])   . GERD (gastroesophageal reflux disease)   . Hyperlipidemia   . Hypertension   . 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 Urinary incontinence    Past Surgical History:  Procedure Laterality Date  . CHOLECYSTECTOMY  2001  . COLONOSCOPY WITH PROPOFOL N/A 03/27/2016   Procedure: COLONOSCOPY WITH PROPOFOL;  Surgeon: Lucilla Lame, MD;  Location: [facility] ENDOSCOPY;  Service: Endoscopy;  Laterality: N/A;  . KNEE SURGERY  2003-2009   s/p 8 knee surgeries (with 3 knee replacements)  . NEPHRECTOMY RADICAL  07/2015  . NISSEN FUNDOPLICATION     Family History  Problem Relation Age of Onset  . Colon cancer Mother        died - age 3  . Heart disease Father   . Hypertension Father   . Lymphoma Father   . Arthritis/Rheumatoid Sister   . Lung cancer Brother        smoked  . Arthritis/Rheumatoid Paternal Grandmother   .  Cancer Maternal Uncle        Unknown type of cancer   . Cancer Paternal Aunt        Unknown type of cancer   . Throat cancer Paternal Uncle   . Colon cancer Paternal Uncle   . Breast cancer Neg Hx    Social History   Socioeconomic History  . Marital status: Divorced    Spouse name: Not on file  . Number of children: 2  . Years of education: Not on file  . Highest education level: Not on file  Occupational History  . Not on file  Social Needs  . Financial resource strain: Not on file  . Food insecurity:    Worry: Not on file    Inability: Not on file  . Transportation needs:    Medical: Not on file    Non-medical: Not on file  Tobacco Use  . Smoking status: Former Smoker    Packs/day: 1.00    Years: 15.00    Pack years: 15.00    Types: Cigarettes    Last attempt to quit: 10/23/2012    Years since quitting: 5.4  . Smokeless tobacco: Never Used  . Tobacco comment: Quit Feb  2014  Substance and Sexual Activity  . Alcohol use: Yes    Alcohol/week: 0.6 oz    Types: 1 Glasses of wine per week    Comment: social drinking - approx 2-4 monthly   . Drug use: No  . Sexual activity: Not Currently  Lifestyle  . Physical activity:    Days per week: Not on file    Minutes per session: Not on file  . Stress: Not on file  Relationships  . Social connections:    Talks on phone: Not on file    Gets together: Not on file    Attends religious service: Not on file    Active member of club or organization: Not on file    Attends meetings of clubs or organizations: Not on file    Relationship status: Not on file  Other Topics Concern  . Not on file  Social History Narrative  . Not on file    Outpatient Encounter Medications as of 03/27/2018  Medication Sig  . bisoprolol-hydrochlorothiazide (ZIAC) 2.5-6.25 MG tablet TAKE 1 TABLET BY MOUTH DAILY FOR HIGH BLOOD PRESSURE  . butalbital-acetaminophen-caffeine (FIORICET, ESGIC) 50-325-40 MG tablet TAKE ONE TABLET EACH DAY AS NEEDED FOR  HEADACHE  . citalopram (CELEXA) 40 MG tablet TAKE 1 TABLET BY MOUTH EVERY DAY  . clonazePAM (KLONOPIN) 0.5 MG tablet TAKE 1 TABLET BY MOUTH EVERY DAY AS NEEDED.  Marland Kitchen clopidogrel (PLAVIX) 75 MG tablet TAKE 1 TABLET BY MOUTH EVERY DAY  . diphenoxylate-atropine (LOMOTIL) 2.5-0.025 MG tablet Take 1 tablet by mouth 4 (four) times daily as needed for diarrhea or loose stools.  . ferrous sulfate 325 (65 FE) MG EC tablet Take 1 tablet (325 mg total) by mouth 2 (two) times daily with a meal.  . Loperamide HCl (IMODIUM PO) Take by mouth as needed.  . meloxicam (MOBIC) 7.5 MG tablet TAKE ONE TABLET TWICE DAILY  . Misc Natural Products (ALLERGY RELEAF SYSTEM PO) Take by mouth. Reported on 03/20/2016  . nystatin cream (MYCOSTATIN) Apply 1 application topically 2 (two) times daily.  . rosuvastatin (CRESTOR) 5 MG tablet TAKE 1 TABLET BY MOUTH EVERY DAY  . torsemide (DEMADEX) 20 MG tablet TAKE 1 TABLET BY MOUTH EVERY DAY AS NEEDED.  . Vitamin D, Ergocalciferol, (DRISDOL) 50000 units CAPS capsule Take 1 capsule (50,000 Units total) by mouth every 7 (seven) days.  . cyanocobalamin (,VITAMIN B-12,) 1000 MCG/ML injection Inject into the muscle once a week 2 weeks and then every 30 days.  Marland Kitchen NEEDLE, DISP, 25 G 25G X 1" MISC Use as directed with B12 injection  . SYRINGE-NEEDLE, DISP, 3 ML 25G X 5/8" 3 ML MISC Use as directed with B12 injection  . [EXPIRED] cyanocobalamin ((VITAMIN B-12)) injection 1,000 mcg    No facility-administered encounter medications on file as of 03/27/2018.     Review of Systems  Constitutional: Negative for appetite change and unexpected weight change.  HENT: Negative for congestion and sinus pressure.   Respiratory: Negative for cough, chest tightness and shortness of breath.   Cardiovascular: Negative for chest pain, palpitations and leg swelling.  Gastrointestinal: Negative for abdominal pain, diarrhea, nausea and vomiting.  Genitourinary: Positive for frequency and urgency.    Musculoskeletal: Negative for joint swelling and myalgias.  Skin: Negative for color change and rash.  Neurological: Negative for dizziness, light-headedness and headaches.  Psychiatric/Behavioral: Negative for agitation and dysphoric mood.       Objective:    Physical Exam  Constitutional: She appears well-developed and well-nourished. No distress.  HENT:  Nose: Nose normal.  Mouth/Throat: Oropharynx is clear and moist.  Neck: Neck supple. No thyromegaly present.  Cardiovascular: Normal rate and regular rhythm.  Pulmonary/Chest: Breath sounds normal. No respiratory distress. She has no wheezes.  Abdominal: Soft. Bowel sounds are normal. There is no tenderness.  Musculoskeletal: She exhibits no edema or tenderness.  Lymphadenopathy:    She has no cervical adenopathy.  Skin: No rash noted. No erythema.  Psychiatric: She has a normal mood and affect. Her behavior is normal.    BP 110/76 (BP Location: Left Arm, Patient Position: Sitting, Cuff Size: Large)   Pulse (!) 55   Temp 98 F (36.7 C) (Oral)   Resp 18   Wt 229 lb (103.9 kg)   SpO2 98%   BMI 33.82 kg/m  Wt Readings from Last 3 Encounters:  03/27/18 229 lb (103.9 kg)  02/20/18 228 lb 8 oz (103.6 kg)  01/02/18 226 lb 3.2 oz (102.6 kg)     Lab Results  Component Value Date   WBC 5.7 11/28/2017   HGB 13.5 11/28/2017   HCT 39.7 11/28/2017   PLT 207 11/28/2017   GLUCOSE 90 02/06/2018   CHOL 166 11/28/2017   TRIG 106 11/28/2017   HDL 53 11/28/2017   LDLCALC 92 11/28/2017   ALT 13 02/06/2018   AST 15 02/06/2018   NA 138 02/06/2018   K 4.3 02/06/2018   CL 100 02/06/2018   CREATININE 0.94 02/06/2018   BUN 21 02/06/2018   CO2 25 02/06/2018   TSH 1.370 11/28/2017   HGBA1C 5.5 11/28/2017    Ct Abdomen Pelvis Wo Contrast  Result Date: 03/02/2018 CLINICAL DATA:  Restaging left renal cell carcinoma. EXAM: CT CHEST, ABDOMEN AND PELVIS WITHOUT CONTRAST TECHNIQUE: Multidetector CT imaging of the chest, abdomen and  pelvis was performed following the standard protocol without IV contrast. COMPARISON:  None. FINDINGS: CT CHEST FINDINGS Cardiovascular: The heart size is normal. No substantial pericardial effusion. Atherosclerotic calcification is noted in the wall of the thoracic aorta. Mediastinum/Nodes: No mediastinal lymphadenopathy. No evidence for gross hilar lymphadenopathy although assessment is limited by the lack of intravenous contrast on today's study. The esophagus has normal imaging features. There is no axillary lymphadenopathy. Lungs/Pleura: The central tracheobronchial airways are patent. 4 mm right middle lobe nodule identified image 84 : Series 3. Calcified granuloma noted right middle lobe. 3 mm subpleural right lower lobe nodule evident (99:3). 3 mm subpleural left lower lobe nodule (105:3) is likely a subpleural lymph node. 4 mm perifissural nodule is identified in the left lower lobe (70:3). Several other scattered tiny (less than 4 mm) nodules are identified bilaterally. No focal airspace consolidation. No pulmonary edema or pleural effusion. Musculoskeletal: No worrisome lytic or sclerotic osseous abnormality. CT ABDOMEN PELVIS FINDINGS Hepatobiliary: 2.6 cm well-defined homogeneous low-density lesion in the posterior right liver approaches water attenuation and is compatible with a cyst. Gallbladder surgically absent. Biliary prominence likely related to prior cholecystectomy. Pancreas: No focal mass lesion. No dilatation of the main duct. No intraparenchymal cyst. No peripancreatic edema. Spleen: No splenomegaly. No focal mass lesion. Adrenals/Urinary Tract: No adrenal nodule or mass. Right kidney unremarkable on noncontrast imaging. Left kidney surgically absent. No abnormal or unexpected soft tissue in the nephrectomy bed. No evidence for right hydroureter. The urinary bladder appears normal for the degree of distention. Stomach/Bowel: Sequelae of fundoplication evident. Stomach otherwise unremarkable.  Duodenum is normally positioned as is the ligament of Treitz. No small bowel wall thickening. No small bowel dilatation. The terminal  ileum is normal. The appendix is normal. No gross colonic mass. No colonic wall thickening. No substantial diverticular change. Vascular/Lymphatic: There is abdominal aortic atherosclerosis without aneurysm. There is no gastrohepatic or hepatoduodenal ligament lymphadenopathy. No intraperitoneal or retroperitoneal lymphadenopathy. No pelvic sidewall lymphadenopathy. Reproductive: The uterus has normal CT imaging appearance. There is no adnexal mass. Other: No intraperitoneal free fluid. Musculoskeletal: No worrisome lytic or sclerotic osseous abnormality. IMPRESSION: 1. No CT findings on this noncontrast exam to suggest recurrent or metastatic disease status post left nephrectomy for renal cell carcinoma. 2. Tiny bilateral pulmonary nodules, likely benign. Previous CT report from 04/18/2016 at [facility] in [facility] everywhere documented tiny left lower lobe pulmonary nodules. Continued attention on follow-up suggested. 3. Prior Nissen fundoplication and cholecystectomy. 4.  Aortic Atherosclerois (ICD10-170.0) Electronically Signed   By: Misty Stanley M.D.   On: 03/02/2018 08:56   Ct Chest Wo Contrast  Result Date: 03/02/2018 CLINICAL DATA:  Restaging left renal cell carcinoma. EXAM: CT CHEST, ABDOMEN AND PELVIS WITHOUT CONTRAST TECHNIQUE: Multidetector CT imaging of the chest, abdomen and pelvis was performed following the standard protocol without IV contrast. COMPARISON:  None. FINDINGS: CT CHEST FINDINGS Cardiovascular: The heart size is normal. No substantial pericardial effusion. Atherosclerotic calcification is noted in the wall of the thoracic aorta. Mediastinum/Nodes: No mediastinal lymphadenopathy. No evidence for gross hilar lymphadenopathy although assessment is limited by the lack of intravenous contrast on today's study. The esophagus has normal imaging features.  There is no axillary lymphadenopathy. Lungs/Pleura: The central tracheobronchial airways are patent. 4 mm right middle lobe nodule identified image 84 : Series 3. Calcified granuloma noted right middle lobe. 3 mm subpleural right lower lobe nodule evident (99:3). 3 mm subpleural left lower lobe nodule (105:3) is likely a subpleural lymph node. 4 mm perifissural nodule is identified in the left lower lobe (70:3). Several other scattered tiny (less than 4 mm) nodules are identified bilaterally. No focal airspace consolidation. No pulmonary edema or pleural effusion. Musculoskeletal: No worrisome lytic or sclerotic osseous abnormality. CT ABDOMEN PELVIS FINDINGS Hepatobiliary: 2.6 cm well-defined homogeneous low-density lesion in the posterior right liver approaches water attenuation and is compatible with a cyst. Gallbladder surgically absent. Biliary prominence likely related to prior cholecystectomy. Pancreas: No focal mass lesion. No dilatation of the main duct. No intraparenchymal cyst. No peripancreatic edema. Spleen: No splenomegaly. No focal mass lesion. Adrenals/Urinary Tract: No adrenal nodule or mass. Right kidney unremarkable on noncontrast imaging. Left kidney surgically absent. No abnormal or unexpected soft tissue in the nephrectomy bed. No evidence for right hydroureter. The urinary bladder appears normal for the degree of distention. Stomach/Bowel: Sequelae of fundoplication evident. Stomach otherwise unremarkable. Duodenum is normally positioned as is the ligament of Treitz. No small bowel wall thickening. No small bowel dilatation. The terminal ileum is normal. The appendix is normal. No gross colonic mass. No colonic wall thickening. No substantial diverticular change. Vascular/Lymphatic: There is abdominal aortic atherosclerosis without aneurysm. There is no gastrohepatic or hepatoduodenal ligament lymphadenopathy. No intraperitoneal or retroperitoneal lymphadenopathy. No pelvic sidewall  lymphadenopathy. Reproductive: The uterus has normal CT imaging appearance. There is no adnexal mass. Other: No intraperitoneal free fluid. Musculoskeletal: No worrisome lytic or sclerotic osseous abnormality. IMPRESSION: 1. No CT findings on this noncontrast exam to suggest recurrent or metastatic disease status post left nephrectomy for renal cell carcinoma. 2. Tiny bilateral pulmonary nodules, likely benign. Previous CT report from 04/18/2016 at [facility] in [facility] everywhere documented tiny left lower lobe pulmonary nodules. Continued attention on  follow-up suggested. 3. Prior Nissen fundoplication and cholecystectomy. 4.  Aortic Atherosclerois (ICD10-170.0) Electronically Signed   By: Misty Stanley M.D.   On: 03/02/2018 08:56       Assessment & Plan:   Problem List Items Addressed This Visit    Anemia    Has a documented history of anemia.  Last hgb that I have on record wnl.  Recheck cbc and iron studies at pts request.        Relevant Medications   cyanocobalamin ((VITAMIN B-12)) injection 1,000 mcg (Completed)   cyanocobalamin (,VITAMIN B-12,) 1000 MCG/ML injection   Other Relevant Orders   CBC with Differential/Platelet   Ferritin   Iron and TIBC   Depression    Doing well on current regimen.  Follow.        Diabetes mellitus ([place])    Low carb diet and exercise.  Follow met b and a1c.        Relevant Orders   Hemoglobin V7C   Basic metabolic panel   H/O supraventricular tachycardia    S/p ablation.  Has seen cardiology.  Stable.       Hypercholesteremia    On crestor.  Low cholesterol diet and exercise.  Follow lipid panel and liver function tests.        Relevant Orders   Hepatic function panel   Lipid panel   Hypertension    Blood pressure under good control.  Continue same medication regimen.  Follow pressures.  Follow metabolic panel.        IBS (irritable bowel syndrome)    Stable.       Renal cell carcinoma ([facility])    S/p left radical nephrectomy  and partial adrenalectomy.  Was followed by Dr [name].  Now seeing Dr Tasia Catchings.  Following CT abdomen and chest as outlined in note.         Other Visit Diagnoses    Urinary frequency    -  Primary   Symptoms improved.  Check urinalysis and culture to confirm no infection.     Relevant Orders   Urinalysis, Routine w reflex microscopic (Completed)   Urine Culture (Completed)   B12 deficiency       Relevant Medications   cyanocobalamin ((VITAMIN B-12)) injection 1,000 mcg (Completed)       Einar Pheasant, MD

## 2018-03-27 NOTE — Telephone Encounter (Signed)
Copied from [facility] [id number]. Topic: Inquiry >> Mar 27, 2018  3:47 PM Oliver Pila B wrote: Reason for CRM: pt called about her [employer] form and asked if it could be faxed to: [phone]; contact pt to let her know this can be done or if another method is needed

## 2018-03-28 LAB — URINALYSIS, ROUTINE W REFLEX MICROSCOPIC
BILIRUBIN UA: NEGATIVE
Glucose, UA: NEGATIVE
Ketones, UA: NEGATIVE
LEUKOCYTES UA: NEGATIVE
Nitrite, UA: NEGATIVE
PH UA: 5.5 (ref 5.0–7.5)
PROTEIN UA: NEGATIVE
RBC UA: NEGATIVE
SPEC GRAV UA: 1.01 (ref 1.005–1.030)
Urobilinogen, Ur: 0.2 mg/dL (ref 0.2–1.0)

## 2018-03-29 LAB — URINE CULTURE

## 2018-03-30 ENCOUNTER — Encounter: Payer: Self-pay | Admitting: Internal Medicine

## 2018-03-30 NOTE — Assessment & Plan Note (Signed)
Has a documented history of anemia.  Last hgb that I have on record wnl.  Recheck cbc and iron studies at pts request.

## 2018-03-30 NOTE — Assessment & Plan Note (Signed)
Low carb diet and exercise.  Follow met b and a1c.   

## 2018-03-30 NOTE — Assessment & Plan Note (Signed)
Stable

## 2018-03-30 NOTE — Assessment & Plan Note (Signed)
Doing well on current regimen.  Follow.   

## 2018-03-30 NOTE — Assessment & Plan Note (Signed)
S/p left radical nephrectomy and partial adrenalectomy.  Was followed by Dr [name].  Now seeing Dr Tasia Catchings.  Following CT abdomen and chest as outlined in note.

## 2018-03-30 NOTE — Assessment & Plan Note (Signed)
On crestor.  Low cholesterol diet and exercise.  Follow lipid panel and liver function tests.   

## 2018-03-30 NOTE — Assessment & Plan Note (Signed)
Blood pressure under good control.  Continue same medication regimen.  Follow pressures.  Follow metabolic panel.   

## 2018-03-30 NOTE — Assessment & Plan Note (Signed)
S/p ablation.  Has seen cardiology.  Stable.

## 2018-03-31 NOTE — Telephone Encounter (Signed)
Faxed to patient. Patient aware

## 2018-04-03 DIAGNOSIS — M799 Soft tissue disorder, unspecified: Secondary | ICD-10-CM | POA: Diagnosis not present

## 2018-04-03 DIAGNOSIS — R2242 Localized swelling, mass and lump, left lower limb: Secondary | ICD-10-CM | POA: Diagnosis not present

## 2018-04-03 DIAGNOSIS — R2241 Localized swelling, mass and lump, right lower limb: Secondary | ICD-10-CM | POA: Diagnosis not present

## 2018-05-11 ENCOUNTER — Other Ambulatory Visit: Payer: Self-pay | Admitting: Internal Medicine

## 2018-05-12 NOTE — Telephone Encounter (Signed)
Last OV 03/27/18 Next OV 08/07/18 Last refill 11/30/17

## 2018-05-12 NOTE — Telephone Encounter (Signed)
ok'd rx for clonazepam 330 with no refills.

## 2018-05-13 ENCOUNTER — Other Ambulatory Visit: Payer: Self-pay | Admitting: Internal Medicine

## 2018-05-13 NOTE — Telephone Encounter (Signed)
Faxed to pharmacy

## 2018-05-20 ENCOUNTER — Other Ambulatory Visit: Payer: Self-pay

## 2018-05-20 ENCOUNTER — Inpatient Hospital Stay: Payer: 59 | Attending: Oncology

## 2018-05-20 DIAGNOSIS — D509 Iron deficiency anemia, unspecified: Secondary | ICD-10-CM

## 2018-05-20 DIAGNOSIS — Z85528 Personal history of other malignant neoplasm of kidney: Secondary | ICD-10-CM | POA: Diagnosis not present

## 2018-05-20 LAB — CBC WITH DIFFERENTIAL/PLATELET
BASOS ABS: 0 10*3/uL (ref 0–0.1)
BASOS PCT: 1 %
EOS ABS: 0.2 10*3/uL (ref 0–0.7)
Eosinophils Relative: 3 %
HEMATOCRIT: 38.7 % (ref 35.0–47.0)
Hemoglobin: 13.2 g/dL (ref 12.0–16.0)
Lymphocytes Relative: 39 %
Lymphs Abs: 2.2 10*3/uL (ref 1.0–3.6)
MCH: 29.4 pg (ref 26.0–34.0)
MCHC: 34 g/dL (ref 32.0–36.0)
MCV: 86.5 fL (ref 80.0–100.0)
MONO ABS: 0.4 10*3/uL (ref 0.2–0.9)
Monocytes Relative: 7 %
NEUTROS ABS: 2.9 10*3/uL (ref 1.4–6.5)
Neutrophils Relative %: 50 %
PLATELETS: 238 10*3/uL (ref 150–440)
RBC: 4.47 MIL/uL (ref 3.80–5.20)
RDW: 13.8 % (ref 11.5–14.5)
WBC: 5.7 10*3/uL (ref 3.6–11.0)

## 2018-05-20 LAB — IRON AND TIBC
Iron: 145 ug/dL (ref 28–170)
SATURATION RATIOS: 41 % — AB (ref 10.4–31.8)
TIBC: 355 ug/dL (ref 250–450)
UIBC: 210 ug/dL

## 2018-05-20 LAB — FERRITIN: FERRITIN: 33 ng/mL (ref 11–307)

## 2018-05-22 ENCOUNTER — Inpatient Hospital Stay: Payer: 59 | Admitting: Oncology

## 2018-05-22 ENCOUNTER — Other Ambulatory Visit: Payer: Self-pay

## 2018-05-22 ENCOUNTER — Encounter: Payer: Self-pay | Admitting: Oncology

## 2018-05-22 VITALS — BP 124/72 | HR 55 | Temp 97.5°F | Wt 228.4 lb

## 2018-05-22 DIAGNOSIS — Z85528 Personal history of other malignant neoplasm of kidney: Secondary | ICD-10-CM

## 2018-05-22 DIAGNOSIS — D509 Iron deficiency anemia, unspecified: Secondary | ICD-10-CM | POA: Diagnosis not present

## 2018-05-22 DIAGNOSIS — R790 Abnormal level of blood mineral: Secondary | ICD-10-CM

## 2018-05-22 NOTE — Progress Notes (Signed)
Hematology/Oncology follow up note [facility] Telephone:([phone] [phone]) [phone]   Patient Care Team: Einar Pheasant, MD as PCP - General (Internal Medicine)  REFERRING PROVIDER: Einar Pheasant, MD REASON FOR VISIT Follow up for treatment of renal cell carcinoma  HISTORY OF PRESENTING ILLNESS:  Jacqueline Nichols is a  62 y.o.  female with PMH listed below who was referred to me for evaluation of history of renal cell carcinoma Patient follows up with [facility] urology and want to switch her care to local. Extensive medical chart review was performed by me. History of Stage II left renal cell carcinoma.  In 2016, she was found to have a left renal mass 7.9 cm x 2.6 cm x 9.3 cm.  Underwent laparoscopic left radical nephrectomy with Dr.Matthew Reyna on 07/18/2015.  Per Note in care everywhere, patient has clear cell renal cell carcinoma, ISU P nuclear grade 2, pT2a, margins of resections are negative for tumor, no metastatic carcinoma identified in 1 hilar lymph nodes (0/1), background kidney with scattered sclerotic glomeruli; histologically unremarkable adrenal gland.  Patient is currently 2 and 1/2 years after surgery.  She has been having surveillance ultrasound every 6 months.  Today patient denies any cough, weight loss, hematuria, flank pain. Former smoker  INTERVAL HISTORY Jacqueline Nichols is a 62 y.o. female who has above history reviewed by me today presents for follow up visit for management of history of renal cell carcinoma  Reports doing well at baseline. Reports persistent hot flush since her menopause years ago. No aggravating or alleviated factors. She took a few years of contraceptives, after she had her episode of stroke, OCP was discontinued.   Review of Systems  Constitutional: Negative for chills, fever, malaise/fatigue and weight loss.       Hot flush  HENT: Negative for congestion, ear discharge, ear pain, nosebleeds, sinus pain and sore  throat.   Eyes: Negative for double vision, photophobia, pain, discharge and redness.  Respiratory: Negative for cough, hemoptysis, sputum production, shortness of breath and wheezing.   Cardiovascular: Negative for chest pain, palpitations, orthopnea, claudication and leg swelling.  Gastrointestinal: Negative for abdominal pain, blood in stool, constipation, diarrhea, heartburn, melena, nausea and vomiting.  Genitourinary: Negative for dysuria, flank pain, frequency and hematuria.  Musculoskeletal: Negative for back pain, myalgias and neck pain.  Skin: Negative for itching and rash.  Neurological: Negative for dizziness, tingling, tremors, focal weakness, weakness and headaches.  Endo/Heme/Allergies: Negative for environmental allergies. Does not bruise/bleed easily.  Psychiatric/Behavioral: Negative for depression and hallucinations. The patient is not nervous/anxious.     MEDICAL HISTORY:  Past Medical History:  Diagnosis Date  . Cancer ([place])    skin  . Chicken pox   . Colon polyps   . Depression   . Diabetes mellitus ([place])   . GERD (gastroesophageal reflux disease)   . Hyperlipidemia   . Hypertension   . 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 Urinary incontinence     SURGICAL HISTORY: Past Surgical History:  Procedure Laterality Date  . CHOLECYSTECTOMY  2001  . COLONOSCOPY WITH PROPOFOL N/A 03/27/2016   Procedure: COLONOSCOPY WITH PROPOFOL;  Surgeon: Lucilla Lame, MD;  Location: [facility] ENDOSCOPY;  Service: Endoscopy;  Laterality: N/A;  . KNEE SURGERY  2003-2009   s/p 8 knee surgeries (with 3 knee replacements)  . NEPHRECTOMY RADICAL  07/2015  . NISSEN FUNDOPLICATION      SOCIAL HISTORY: Social History   Socioeconomic History  . Marital status: Divorced  Spouse name: Not on file  . Number of children: 2  . Years of education: Not on file  . Highest education level: Not on file  Occupational History  . Not on file  Social Needs  . Financial resource strain: Not on file  .  Food insecurity:    Worry: Not on file    Inability: Not on file  . Transportation needs:    Medical: Not on file    Non-medical: Not on file  Tobacco Use  . Smoking status: Former Smoker    Packs/day: 1.00    Years: 15.00    Pack years: 15.00    Types: Cigarettes    Last attempt to quit: 10/23/2012    Years since quitting: 5.5  . Smokeless tobacco: Never Used  . Tobacco comment: Quit Feb 2014  Substance and Sexual Activity  . Alcohol use: Yes    Alcohol/week: 1.0 standard drinks    Types: 1 Glasses of wine per week    Comment: social drinking - approx 2-4 monthly   . Drug use: No  . Sexual activity: Not Currently  Lifestyle  . Physical activity:    Days per week: Not on file    Minutes per session: Not on file  . Stress: Not on file  Relationships  . Social connections:    Talks on phone: Not on file    Gets together: Not on file    Attends religious service: Not on file    Active member of club or organization: Not on file    Attends meetings of clubs or organizations: Not on file    Relationship status: Not on file  . Intimate partner violence:    Fear of current or ex partner: Not on file    Emotionally abused: Not on file    Physically abused: Not on file    Forced sexual activity: Not on file  Other Topics Concern  . Not on file  Social History Narrative  . Not on file    FAMILY HISTORY: Family History  Problem Relation Age of Onset  . Colon cancer Mother        died - age 39  . Heart disease Father   . Hypertension Father   . Lymphoma Father   . Arthritis/Rheumatoid Sister   . Lung cancer Brother        smoked  . Arthritis/Rheumatoid Paternal Grandmother   . Cancer Maternal Uncle        Unknown type of cancer   . Cancer Paternal Aunt        Unknown type of cancer   . Throat cancer Paternal Uncle   . Colon cancer Paternal Uncle   . Breast cancer Neg Hx     ALLERGIES:  is allergic to codeine and vicodin  [hydrocodone-acetaminophen].  MEDICATIONS:  Current Outpatient Medications  Medication Sig Dispense Refill  . bisoprolol-hydrochlorothiazide (ZIAC) 2.5-6.25 MG tablet TAKE 1 TABLET BY MOUTH DAILY FOR HIGH BLOOD PRESSURE 90 tablet 1  . butalbital-acetaminophen-caffeine (FIORICET, ESGIC) 50-325-40 MG tablet TAKE ONE TABLET EACH DAY AS NEEDED FOR HEADACHE 15 tablet 0  . citalopram (CELEXA) 40 MG tablet TAKE 1 TABLET BY MOUTH EVERY DAY 90 tablet 1  . clonazePAM (KLONOPIN) 0.5 MG tablet TAKE 1 TABLET BY MOUTH EVERY DAY AS NEEDED. 30 tablet 0  . clopidogrel (PLAVIX) 75 MG tablet TAKE 1 TABLET BY MOUTH EVERY DAY 90 tablet 1  . cyanocobalamin (,VITAMIN B-12,) 1000 MCG/ML injection Inject into the muscle once a week  2 weeks and then every 30 days. 1 mL 11  . diphenoxylate-atropine (LOMOTIL) 2.5-0.025 MG tablet Take 1 tablet by mouth 4 (four) times daily as needed for diarrhea or loose stools. 120 tablet 5  . ferrous sulfate 325 (65 FE) MG EC tablet Take 1 tablet (325 mg total) by mouth 2 (two) times daily with a meal. 60 tablet 2  . Loperamide HCl (IMODIUM PO) Take by mouth as needed.    . meloxicam (MOBIC) 7.5 MG tablet TAKE ONE TABLET TWICE DAILY 60 tablet 5  . Misc Natural Products (ALLERGY RELEAF SYSTEM PO) Take by mouth. Reported on 03/20/2016    . NEEDLE, DISP, 25 G 25G X 1" MISC Use as directed with B12 injection 50 each 11  . nystatin cream (MYCOSTATIN) Apply 1 application topically 2 (two) times daily. 30 g 0  . rosuvastatin (CRESTOR) 5 MG tablet TAKE 1 TABLET BY MOUTH EVERY DAY 90 tablet 1  . SYRINGE-NEEDLE, DISP, 3 ML 25G X 5/8" 3 ML MISC Use as directed with B12 injection 50 each 11  . torsemide (DEMADEX) 20 MG tablet TAKE 1 TABLET BY MOUTH EVERY DAY AS NEEDED. 90 tablet 1  . Vitamin D, Ergocalciferol, (DRISDOL) 50000 units CAPS capsule Take 1 capsule (50,000 Units total) by mouth every 7 (seven) days. 12 capsule 0   No current facility-administered medications for this visit.       PHYSICAL EXAMINATION: ECOG PERFORMANCE STATUS: 0 - Asymptomatic Vitals:   05/22/18 1631  BP: 124/72  Pulse: (!) 55  Temp: (!) 97.5 F (36.4 C)   Filed Weights   05/22/18 1631  Weight: 228 lb 7 oz (103.6 kg)    Physical Exam  Constitutional: She is oriented to person, place, and time. No distress.  HENT:  Head: Normocephalic and atraumatic.  Mouth/Throat: Oropharynx is clear and moist.  Eyes: Pupils are equal, round, and reactive to light. Conjunctivae and EOM are normal. No scleral icterus.  Neck: Normal range of motion. Neck supple.  Cardiovascular: Normal rate, regular rhythm and normal heart sounds.  Pulmonary/Chest: Effort normal and breath sounds normal. No respiratory distress. She has no wheezes. She has no rales. She exhibits no tenderness.  Abdominal: Soft. Bowel sounds are normal. She exhibits no distension and no mass. There is no tenderness.  Musculoskeletal: Normal range of motion. She exhibits no edema or deformity.  Lymphadenopathy:    She has no cervical adenopathy.  Neurological: She is alert and oriented to person, place, and time. No cranial nerve deficit. Coordination normal.  Skin: Skin is warm and dry. No rash noted. No erythema.  Psychiatric: She has a normal mood and affect. Her behavior is normal. Thought content normal.     LABORATORY DATA:  I have reviewed the data as listed Lab Results  Component Value Date   WBC 5.7 05/20/2018   HGB 13.2 05/20/2018   HCT 38.7 05/20/2018   MCV 86.5 05/20/2018   PLT 238 05/20/2018   Recent Labs    11/28/17 1130 02/06/18 1444  NA 141 138  K 5.1 4.3  CL 104 100  CO2 24 25  GLUCOSE 84 90  BUN 26 21  CREATININE 1.06* 0.94  CALCIUM 9.7 9.4  GFRNONAA 57* 66  GFRAA 66 76  PROT 6.5 6.3  ALBUMIN 4.3 4.3  AST 15 15  ALT 14 13  ALKPHOS 102 88  BILITOT 0.4 0.4  BILIDIR 0.12 0.11   Iron/TIBC/Ferritin/ %Sat    Component Value Date/Time   IRON 145 05/20/2018 1625  TIBC 355 05/20/2018 1625    FERRITIN 33 05/20/2018 1625   IRONPCTSAT 41 (H) 05/20/2018 1625     Reviewed lab work which was done at PCPs office on 02/03/2018. Creatinine 0.83, sodium 139, potassium 4.3, calcium 9.7, bilirubin 0.4, AST 14 ALT 11 WBC 6, hemoglobin 10.9, MCV 79, platelet 223,000, normal differential. Ferritin 11  ASSESSMENT & PLAN:  1. History of kidney cancer   2. Abnormal iron saturation    # History of Stage II renal cell carcinoma, per NCCN guideline,  Suggests follow up every 3-6 months for 3 years, then annually for 5 years.  CBC CMP every 6 months x 2 y, then annually.  Abd Image every 6 months x 3 years and then annually up to 5 years Chest CT every 6 months x 3 years then annually.   CT image was independently reviewed and discussed with patient. No evidence of recurrence or metastatic disease.  She will be at 3 year mark at the end of the year. Will proceed with another CT scan at that time and after that, she will need annual CT scan for surveillance.   # Iron deficiency anemia Repeat Iron panel showed elevated iron saturation. Advise patient to stop iron supplements. Will repeat iron panel. Will also obtain hemochromatosis work up.   Orders Placed This Encounter  Procedures  . CT Chest Wo Contrast    Standing Status:   Future    Standing Expiration Date:   05/22/2019    Order Specific Question:   ** REASON FOR EXAM (FREE TEXT)    Answer:   kidney cancer follow up    Order Specific Question:   Is patient pregnant?    Answer:   No    Order Specific Question:   Preferred imaging location?    Answer:   [facility]    Order Specific Question:   Radiology Contrast Protocol - do NOT remove file path    Answer:   \\[email or web address]  . CT Abdomen Pelvis W Wo Contrast    Standing Status:   Future    Standing Expiration Date:   05/23/2019    Order Specific Question:   ** REASON FOR EXAM (FREE TEXT)    Answer:   kidney cancer follow up    Order Specific  Question:   If indicated for the ordered procedure, I authorize the administration of contrast media per Radiology protocol    Answer:   Yes    Order Specific Question:   Is patient pregnant?    Answer:   No    Order Specific Question:   Preferred imaging location?    Answer:   [facility]    Order Specific Question:   Is Oral Contrast requested for this exam?    Answer:   No oral contrast    Order Specific Question:   Reason for No Oral Contrast    Answer:   Other    Order Specific Question:   Please answer why no oral contrast is requested    Answer:   IBS    Order Specific Question:   Radiology Contrast Protocol - do NOT remove file path    Answer:   \\[email or web address]  . CBC with Differential/Platelet    Standing Status:   Future    Standing Expiration Date:   05/23/2019  . Comprehensive metabolic panel    Standing Status:   Future    Standing Expiration Date:   05/23/2019  . Iron and TIBC    Standing  Status:   Future    Standing Expiration Date:   05/23/2019  . Ferritin    Standing Status:   Future    Standing Expiration Date:   05/23/2019  . Hemochromatosis DNA-PCR(c282y,h63d)    Standing Status:   Future    Standing Expiration Date:   05/23/2019    All questions were answered. The patient knows to call the clinic with any problems questions or concerns.  Return of visit: 3 months. Total face to face encounter time for this patient visit was 25 min. >50% of the time was  spent in counseling and coordination of care.   [name], MD, PhD Hematology Oncology [facility] Pager- [phone] 05/22/2018

## 2018-06-19 ENCOUNTER — Telehealth: Payer: Self-pay | Admitting: Gastroenterology

## 2018-06-19 NOTE — Telephone Encounter (Signed)
Patient called & would like Ginger to call her about her FMLA paperwork. She will be at the work # until 2:30,if after this please call caeel.

## 2018-06-19 NOTE — Telephone Encounter (Signed)
Discussed FMLA paperwork with pt.

## 2018-07-07 ENCOUNTER — Other Ambulatory Visit: Payer: Self-pay

## 2018-07-07 ENCOUNTER — Encounter: Payer: Self-pay | Admitting: Gastroenterology

## 2018-07-07 ENCOUNTER — Ambulatory Visit: Payer: 59 | Admitting: Gastroenterology

## 2018-07-07 VITALS — BP 105/70 | HR 60 | Ht 69.0 in | Wt 229.6 lb

## 2018-07-07 DIAGNOSIS — K580 Irritable bowel syndrome with diarrhea: Secondary | ICD-10-CM

## 2018-07-07 MED ORDER — DIFENOXIN-ATROPINE 1-0.025 MG PO TABS: ORAL_TABLET | ORAL | 0 refills | Status: DC

## 2018-07-07 NOTE — Progress Notes (Signed)
Primary Care Physician: Einar Pheasant, MD  Primary Gastroenterologist:  Dr. Lucilla Lame  Chief Complaint  Patient presents with  . Follow up diarrhea    HPI: Jacqueline Nichols is a 62 y.o. female here for follow-up of long-standing diarrhea.  The patient has had a colonoscopy with biopsies throughout the colon and terminal ileum that were normal.  The patient states that she takes 2 Lomotil's and 2 Imodium's every morning.  She also repeats this every night.  The patient will go for a few days with diarrhea and then states that the diarrhea will go away.  She denies anything she can pinpoint as the cause of her diarrhea.  She does report that it does stop her from going out or partaking in certain activities with her sister.  Current Outpatient Medications  Medication Sig Dispense Refill  . bisoprolol-hydrochlorothiazide (ZIAC) 2.5-6.25 MG tablet TAKE 1 TABLET BY MOUTH DAILY FOR HIGH BLOOD PRESSURE 90 tablet 1  . butalbital-acetaminophen-caffeine (FIORICET, ESGIC) 50-325-40 MG tablet TAKE ONE TABLET EACH DAY AS NEEDED FOR HEADACHE 15 tablet 0  . citalopram (CELEXA) 40 MG tablet TAKE 1 TABLET BY MOUTH EVERY DAY 90 tablet 1  . clonazePAM (KLONOPIN) 0.5 MG tablet TAKE 1 TABLET BY MOUTH EVERY DAY AS NEEDED. 30 tablet 0  . clopidogrel (PLAVIX) 75 MG tablet TAKE 1 TABLET BY MOUTH EVERY DAY 90 tablet 1  . cyanocobalamin (,VITAMIN B-12,) 1000 MCG/ML injection Inject into the muscle once a week 2 weeks and then every 30 days. 1 mL 11  . Difenoxin-Atropine 1-0.025 MG TABS Take 1 tablet four times daily as needed for diarrhea 120 each 0  . diphenoxylate-atropine (LOMOTIL) 2.5-0.025 MG tablet Take 1 tablet by mouth 4 (four) times daily as needed for diarrhea or loose stools. 120 tablet 5  . ferrous sulfate 325 (65 FE) MG EC tablet Take 1 tablet (325 mg total) by mouth 2 (two) times daily with a meal. 60 tablet 2  . Loperamide HCl (IMODIUM PO) Take by mouth as needed.    . meloxicam (MOBIC) 7.5 MG  tablet TAKE ONE TABLET TWICE DAILY 60 tablet 5  . Misc Natural Products (ALLERGY RELEAF SYSTEM PO) Take by mouth. Reported on 03/20/2016    . NEEDLE, DISP, 25 G 25G X 1" MISC Use as directed with B12 injection 50 each 11  . nystatin cream (MYCOSTATIN) Apply 1 application topically 2 (two) times daily. 30 g 0  . rosuvastatin (CRESTOR) 5 MG tablet TAKE 1 TABLET BY MOUTH EVERY DAY 90 tablet 1  . SYRINGE-NEEDLE, DISP, 3 ML 25G X 5/8" 3 ML MISC Use as directed with B12 injection 50 each 11  . torsemide (DEMADEX) 20 MG tablet TAKE 1 TABLET BY MOUTH EVERY DAY AS NEEDED. 90 tablet 1  . Vitamin D, Ergocalciferol, (DRISDOL) 50000 units CAPS capsule Take 1 capsule (50,000 Units total) by mouth every 7 (seven) days. 12 capsule 0   No current facility-administered medications for this visit.     Allergies as of 07/07/2018 - Review Complete 07/07/2018  Allergen Reaction Noted  . Codeine Nausea Only 10/21/2012  . Vicodin [hydrocodone-acetaminophen] Itching 11/20/2012    ROS:  General: Negative for anorexia, weight loss, fever, chills, fatigue, weakness. ENT: Negative for hoarseness, difficulty swallowing , nasal congestion. CV: Negative for chest pain, angina, palpitations, dyspnea on exertion, peripheral edema.  Respiratory: Negative for dyspnea at rest, dyspnea on exertion, cough, sputum, wheezing.  GI: See history of present illness. GU:  Negative for dysuria, hematuria, urinary  incontinence, urinary frequency, nocturnal urination.  Endo: Negative for unusual weight change.    Physical Examination:   BP 105/70   Pulse 60   Ht 5\' 9"  (1.753 m)   Wt 229 lb 9.6 oz (104.1 kg)   BMI 33.91 kg/m   General: Well-nourished, well-developed in no acute distress.  Eyes: No icterus. Conjunctivae pink. Extremities: No lower extremity edema. No clubbing or deformities. Neuro: Alert and oriented x 3.  Grossly intact. Skin: Warm and dry, no jaundice.   Psych: Alert and cooperative, normal mood and  affect.  Labs:    Imaging Studies: No results found.  Assessment and Plan:   Jacqueline Nichols is a 62 y.o. y/o female with a long-standing history of diarrhea and irritable bowel syndrome.  The patient has been taking Lomotil and Imodium.  She will be given a trial of Motofen to see if this helps her diarrhea any better.  She also came to have her FMLA papers filled out which we will do for her.  The patient has been explained the plan and agrees with it.    Lucilla Lame, MD. [facility]   Note: This dictation was prepared with Dragon dictation along with smaller phrase technology. Any transcriptional errors that result from this process are unintentional.

## 2018-07-09 ENCOUNTER — Telehealth: Payer: Self-pay

## 2018-07-09 NOTE — Telephone Encounter (Signed)
Copied from [place] [id number]. Topic: General - Other >> Jul 09, 2018  2:32 PM Yvette Rack wrote: Reason for CRM: pt calling office back stating that someone had called in a min ago please give her a call back at [phone]  I do not see where this patient was called or reason for a call. Please advise?

## 2018-07-10 ENCOUNTER — Telehealth: Payer: Self-pay | Admitting: Gastroenterology

## 2018-07-10 NOTE — Telephone Encounter (Signed)
Pt is calling for Ginger she would like her to call [phone]

## 2018-07-10 NOTE — Telephone Encounter (Signed)
Called patient to let her know that I did not call her or see anything in the chart regarding why she was called

## 2018-07-13 NOTE — Telephone Encounter (Signed)
Returned pt's call to discuss issues. All questions answered.

## 2018-08-06 ENCOUNTER — Other Ambulatory Visit: Payer: Self-pay | Admitting: Oncology

## 2018-08-06 DIAGNOSIS — Z85528 Personal history of other malignant neoplasm of kidney: Secondary | ICD-10-CM

## 2018-08-07 ENCOUNTER — Ambulatory Visit: Payer: 59 | Admitting: Internal Medicine

## 2018-08-07 ENCOUNTER — Encounter: Payer: Self-pay | Admitting: Internal Medicine

## 2018-08-07 VITALS — BP 122/72 | HR 53 | Temp 97.5°F | Resp 16 | Wt 232.0 lb

## 2018-08-07 DIAGNOSIS — Z8679 Personal history of other diseases of the circulatory system: Secondary | ICD-10-CM

## 2018-08-07 DIAGNOSIS — E7800 Pure hypercholesterolemia, unspecified: Secondary | ICD-10-CM

## 2018-08-07 DIAGNOSIS — Z85528 Personal history of other malignant neoplasm of kidney: Secondary | ICD-10-CM

## 2018-08-07 DIAGNOSIS — Z23 Encounter for immunization: Secondary | ICD-10-CM

## 2018-08-07 DIAGNOSIS — F32A Depression, unspecified: Secondary | ICD-10-CM

## 2018-08-07 DIAGNOSIS — R197 Diarrhea, unspecified: Secondary | ICD-10-CM

## 2018-08-07 DIAGNOSIS — D509 Iron deficiency anemia, unspecified: Secondary | ICD-10-CM | POA: Diagnosis not present

## 2018-08-07 DIAGNOSIS — E538 Deficiency of other specified B group vitamins: Secondary | ICD-10-CM

## 2018-08-07 DIAGNOSIS — E559 Vitamin D deficiency, unspecified: Secondary | ICD-10-CM

## 2018-08-07 DIAGNOSIS — F329 Major depressive disorder, single episode, unspecified: Secondary | ICD-10-CM | POA: Diagnosis not present

## 2018-08-07 DIAGNOSIS — E119 Type 2 diabetes mellitus without complications: Secondary | ICD-10-CM

## 2018-08-07 DIAGNOSIS — N649 Disorder of breast, unspecified: Secondary | ICD-10-CM | POA: Diagnosis not present

## 2018-08-07 DIAGNOSIS — I10 Essential (primary) hypertension: Secondary | ICD-10-CM

## 2018-08-07 DIAGNOSIS — J3489 Other specified disorders of nose and nasal sinuses: Secondary | ICD-10-CM

## 2018-08-07 NOTE — Progress Notes (Signed)
Patient ID: Jacqueline Nichols, female   DOB: April 29, 1956, 62 y.o.   MRN: [id number]   Subjective:    Patient ID: Jacqueline Nichols, female    DOB: 12-28-1955, 61 y.o.   MRN: [id number]  HPI  Patient here for a scheduled follow up.  She reports she is doing relatively well.  Still with bowel issues (loose stools).  Seeing Dr Allen Norris.  Taking Lomotil/imodium.  S/p colonoscopy with biopsies throughout the colon and terminal ileum that were normal.  Was going to give her a trial of Motofen.  Unable to afford the medication.  Eating.  No nausea or vomiting.  Overall stable.  Does affect her quality of life.  Continues to f/u with GI.  No chest pain.  No sob.  No acid reflux.  No abdominal pain.  Seeing Dr Tasia Catchings for f/u renal cell carcinoma.  Last evaluated 05/2018.  Stable.  Recommended f/u CT scan at end of 2019.  Scheduled for 08/21/18.  Off iron.  Reports blood pressure doing well.  Persistent nasal lesion.  Has seen ENT.  Plans to f/u given persistence.     Past Medical History:  Diagnosis Date  . Cancer ([place])    skin  . Chicken pox   . Colon polyps   . Depression   . Diabetes mellitus ([place])   . GERD (gastroesophageal reflux disease)   . Hyperlipidemia   . Hypertension   . 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 Urinary incontinence    Past Surgical History:  Procedure Laterality Date  . CHOLECYSTECTOMY  2001  . COLONOSCOPY WITH PROPOFOL N/A 03/27/2016   Procedure: COLONOSCOPY WITH PROPOFOL;  Surgeon: Lucilla Lame, MD;  Location: [facility] ENDOSCOPY;  Service: Endoscopy;  Laterality: N/A;  . KNEE SURGERY  2003-2009   s/p 8 knee surgeries (with 3 knee replacements)  . NEPHRECTOMY RADICAL  07/2015  . NISSEN FUNDOPLICATION     Family History  Problem Relation Age of Onset  . Colon cancer Mother        died - age 68  . Heart disease Father   . Hypertension Father   . Lymphoma Father   . Arthritis/Rheumatoid Sister   . Lung cancer Brother        smoked  . Arthritis/Rheumatoid Paternal Grandmother   . Cancer  Maternal Uncle        Unknown type of cancer   . Cancer Paternal Aunt        Unknown type of cancer   . Throat cancer Paternal Uncle   . Colon cancer Paternal Uncle   . Breast cancer Neg Hx    Social History   Socioeconomic History  . Marital status: Divorced    Spouse name: Not on file  . Number of children: 2  . Years of education: Not on file  . Highest education level: Not on file  Occupational History  . Not on file  Social Needs  . Financial resource strain: Not on file  . Food insecurity:    Worry: Not on file    Inability: Not on file  . Transportation needs:    Medical: Not on file    Non-medical: Not on file  Tobacco Use  . Smoking status: Former Smoker    Packs/day: 1.00    Years: 15.00    Pack years: 15.00    Types: Cigarettes    Last attempt to quit: 10/23/2012    Years since quitting: 5.7  . Smokeless tobacco: Never Used  . Tobacco  comment: Quit Feb 2014  Substance and Sexual Activity  . Alcohol use: Yes    Alcohol/week: 1.0 standard drinks    Types: 1 Glasses of wine per week    Comment: social drinking - approx 2-4 monthly   . Drug use: No  . Sexual activity: Not Currently  Lifestyle  . Physical activity:    Days per week: Not on file    Minutes per session: Not on file  . Stress: Not on file  Relationships  . Social connections:    Talks on phone: Not on file    Gets together: Not on file    Attends religious service: Not on file    Active member of club or organization: Not on file    Attends meetings of clubs or organizations: Not on file    Relationship status: Not on file  Other Topics Concern  . Not on file  Social History Narrative  . Not on file    Outpatient Encounter Medications as of 08/07/2018  Medication Sig  . bisoprolol-hydrochlorothiazide (ZIAC) 2.5-6.25 MG tablet TAKE 1 TABLET BY MOUTH DAILY FOR HIGH BLOOD PRESSURE  . butalbital-acetaminophen-caffeine (FIORICET, ESGIC) 50-325-40 MG tablet TAKE ONE TABLET EACH DAY AS  NEEDED FOR HEADACHE  . citalopram (CELEXA) 40 MG tablet TAKE 1 TABLET BY MOUTH EVERY DAY  . clonazePAM (KLONOPIN) 0.5 MG tablet TAKE 1 TABLET BY MOUTH EVERY DAY AS NEEDED.  Marland Kitchen clopidogrel (PLAVIX) 75 MG tablet TAKE 1 TABLET BY MOUTH EVERY DAY  . Difenoxin-Atropine 1-0.025 MG TABS Take 1 tablet four times daily as needed for diarrhea  . diphenoxylate-atropine (LOMOTIL) 2.5-0.025 MG tablet Take 1 tablet by mouth 4 (four) times daily as needed for diarrhea or loose stools.  . ferrous sulfate 325 (65 FE) MG EC tablet Take 1 tablet (325 mg total) by mouth 2 (two) times daily with a meal.  . Loperamide HCl (IMODIUM PO) Take by mouth as needed.  . meloxicam (MOBIC) 7.5 MG tablet TAKE ONE TABLET TWICE DAILY  . Misc Natural Products (ALLERGY RELEAF SYSTEM PO) Take by mouth. Reported on 03/20/2016  . nystatin cream (MYCOSTATIN) Apply 1 application topically 2 (two) times daily.  . rosuvastatin (CRESTOR) 5 MG tablet TAKE 1 TABLET BY MOUTH EVERY DAY  . torsemide (DEMADEX) 20 MG tablet TAKE 1 TABLET BY MOUTH EVERY DAY AS NEEDED.  . Vitamin D, Ergocalciferol, (DRISDOL) 50000 units CAPS capsule Take 1 capsule (50,000 Units total) by mouth every 7 (seven) days.  . [DISCONTINUED] cyanocobalamin (,VITAMIN B-12,) 1000 MCG/ML injection Inject into the muscle once a week 2 weeks and then every 30 days.  . [DISCONTINUED] NEEDLE, DISP, 25 G 25G X 1" MISC Use as directed with B12 injection  . [DISCONTINUED] SYRINGE-NEEDLE, DISP, 3 ML 25G X 5/8" 3 ML MISC Use as directed with B12 injection   No facility-administered encounter medications on file as of 08/07/2018.     Review of Systems  Constitutional: Negative for appetite change and unexpected weight change.  HENT: Negative for congestion and sinus pressure.   Respiratory: Negative for cough, chest tightness and shortness of breath.   Cardiovascular: Negative for chest pain, palpitations and leg swelling.  Gastrointestinal: Positive for diarrhea. Negative for  abdominal pain, nausea and vomiting.  Genitourinary: Negative for difficulty urinating and dysuria.  Musculoskeletal: Negative for joint swelling and myalgias.  Skin: Negative for color change and rash.  Neurological: Negative for dizziness, light-headedness and headaches.  Psychiatric/Behavioral: Negative for agitation and dysphoric mood.  Objective:    Physical Exam  Constitutional: She appears well-developed and well-nourished. No distress.  HENT:  Mouth/Throat: Oropharynx is clear and moist.  external left nares - persistent lesion/erythema.   Neck: Neck supple. No thyromegaly present.  Cardiovascular: Normal rate and regular rhythm.  Pulmonary/Chest: Breath sounds normal. No respiratory distress. She has no wheezes.  Abdominal: Soft. Bowel sounds are normal. There is no tenderness.  Musculoskeletal: She exhibits no edema or tenderness.  Lymphadenopathy:    She has no cervical adenopathy.  Skin: No rash noted. No erythema.  Psychiatric: She has a normal mood and affect. Her behavior is normal.    BP 122/72 (BP Location: Left Arm, Patient Position: Sitting, Cuff Size: Normal)   Pulse (!) 53   Temp (!) 97.5 F (36.4 C) (Oral)   Resp 16   Wt 232 lb (105.2 kg)   SpO2 96%   BMI 34.26 kg/m  Wt Readings from Last 3 Encounters:  08/07/18 232 lb (105.2 kg)  07/07/18 229 lb 9.6 oz (104.1 kg)  05/22/18 228 lb 7 oz (103.6 kg)     Lab Results  Component Value Date   WBC 5.7 05/20/2018   HGB 13.2 05/20/2018   HCT 38.7 05/20/2018   PLT 238 05/20/2018   GLUCOSE 90 02/06/2018   CHOL 166 11/28/2017   TRIG 106 11/28/2017   HDL 53 11/28/2017   LDLCALC 92 11/28/2017   ALT 13 02/06/2018   AST 15 02/06/2018   NA 138 02/06/2018   K 4.3 02/06/2018   CL 100 02/06/2018   CREATININE 0.94 02/06/2018   BUN 21 02/06/2018   CO2 25 02/06/2018   TSH 1.370 11/28/2017   HGBA1C 5.5 11/28/2017    Ct Abdomen Pelvis Wo Contrast  Result Date: 03/02/2018 CLINICAL DATA:  Restaging  left renal cell carcinoma. EXAM: CT CHEST, ABDOMEN AND PELVIS WITHOUT CONTRAST TECHNIQUE: Multidetector CT imaging of the chest, abdomen and pelvis was performed following the standard protocol without IV contrast. COMPARISON:  None. FINDINGS: CT CHEST FINDINGS Cardiovascular: The heart size is normal. No substantial pericardial effusion. Atherosclerotic calcification is noted in the wall of the thoracic aorta. Mediastinum/Nodes: No mediastinal lymphadenopathy. No evidence for gross hilar lymphadenopathy although assessment is limited by the lack of intravenous contrast on today's study. The esophagus has normal imaging features. There is no axillary lymphadenopathy. Lungs/Pleura: The central tracheobronchial airways are patent. 4 mm right middle lobe nodule identified image 84 : Series 3. Calcified granuloma noted right middle lobe. 3 mm subpleural right lower lobe nodule evident (99:3). 3 mm subpleural left lower lobe nodule (105:3) is likely a subpleural lymph node. 4 mm perifissural nodule is identified in the left lower lobe (70:3). Several other scattered tiny (less than 4 mm) nodules are identified bilaterally. No focal airspace consolidation. No pulmonary edema or pleural effusion. Musculoskeletal: No worrisome lytic or sclerotic osseous abnormality. CT ABDOMEN PELVIS FINDINGS Hepatobiliary: 2.6 cm well-defined homogeneous low-density lesion in the posterior right liver approaches water attenuation and is compatible with a cyst. Gallbladder surgically absent. Biliary prominence likely related to prior cholecystectomy. Pancreas: No focal mass lesion. No dilatation of the main duct. No intraparenchymal cyst. No peripancreatic edema. Spleen: No splenomegaly. No focal mass lesion. Adrenals/Urinary Tract: No adrenal nodule or mass. Right kidney unremarkable on noncontrast imaging. Left kidney surgically absent. No abnormal or unexpected soft tissue in the nephrectomy bed. No evidence for right hydroureter. The  urinary bladder appears normal for the degree of distention. Stomach/Bowel: Sequelae of fundoplication evident. Stomach otherwise unremarkable.  Duodenum is normally positioned as is the ligament of Treitz. No small bowel wall thickening. No small bowel dilatation. The terminal ileum is normal. The appendix is normal. No gross colonic mass. No colonic wall thickening. No substantial diverticular change. Vascular/Lymphatic: There is abdominal aortic atherosclerosis without aneurysm. There is no gastrohepatic or hepatoduodenal ligament lymphadenopathy. No intraperitoneal or retroperitoneal lymphadenopathy. No pelvic sidewall lymphadenopathy. Reproductive: The uterus has normal CT imaging appearance. There is no adnexal mass. Other: No intraperitoneal free fluid. Musculoskeletal: No worrisome lytic or sclerotic osseous abnormality. IMPRESSION: 1. No CT findings on this noncontrast exam to suggest recurrent or metastatic disease status post left nephrectomy for renal cell carcinoma. 2. Tiny bilateral pulmonary nodules, likely benign. Previous CT report [facility]/18/[facility] everywhere documented tiny left lower lobe pulmonary nodules. Continued attention on follow-up suggested. 3. Prior Nissen fundoplication and cholecystectomy. 4.  Aortic Atherosclerois (ICD10-170.0) Electronically Signed   By: Misty Stanley M.D.   On: 03/02/2018 08:56   Ct Chest Wo Contrast  Result Date: 03/02/2018 CLINICAL DATA:  Restaging left renal cell carcinoma. EXAM: CT CHEST, ABDOMEN AND PELVIS WITHOUT CONTRAST TECHNIQUE: Multidetector CT imaging of the chest, abdomen and pelvis was performed following the standard protocol without IV contrast. COMPARISON:  None. FINDINGS: CT CHEST FINDINGS Cardiovascular: The heart size is normal. No substantial pericardial effusion. Atherosclerotic calcification is noted in the wall of the thoracic aorta. Mediastinum/Nodes: No mediastinal lymphadenopathy. No evidence for gross hilar  lymphadenopathy although assessment is limited by the lack of intravenous contrast on today's study. The esophagus has normal imaging features. There is no axillary lymphadenopathy. Lungs/Pleura: The central tracheobronchial airways are patent. 4 mm right middle lobe nodule identified image 84 : Series 3. Calcified granuloma noted right middle lobe. 3 mm subpleural right lower lobe nodule evident (99:3). 3 mm subpleural left lower lobe nodule (105:3) is likely a subpleural lymph node. 4 mm perifissural nodule is identified in the left lower lobe (70:3). Several other scattered tiny (less than 4 mm) nodules are identified bilaterally. No focal airspace consolidation. No pulmonary edema or pleural effusion. Musculoskeletal: No worrisome lytic or sclerotic osseous abnormality. CT ABDOMEN PELVIS FINDINGS Hepatobiliary: 2.6 cm well-defined homogeneous low-density lesion in the posterior right liver approaches water attenuation and is compatible with a cyst. Gallbladder surgically absent. Biliary prominence likely related to prior cholecystectomy. Pancreas: No focal mass lesion. No dilatation of the main duct. No intraparenchymal cyst. No peripancreatic edema. Spleen: No splenomegaly. No focal mass lesion. Adrenals/Urinary Tract: No adrenal nodule or mass. Right kidney unremarkable on noncontrast imaging. Left kidney surgically absent. No abnormal or unexpected soft tissue in the nephrectomy bed. No evidence for right hydroureter. The urinary bladder appears normal for the degree of distention. Stomach/Bowel: Sequelae of fundoplication evident. Stomach otherwise unremarkable. Duodenum is normally positioned as is the ligament of Treitz. No small bowel wall thickening. No small bowel dilatation. The terminal ileum is normal. The appendix is normal. No gross colonic mass. No colonic wall thickening. No substantial diverticular change. Vascular/Lymphatic: There is abdominal aortic atherosclerosis without aneurysm. There is  no gastrohepatic or hepatoduodenal ligament lymphadenopathy. No intraperitoneal or retroperitoneal lymphadenopathy. No pelvic sidewall lymphadenopathy. Reproductive: The uterus has normal CT imaging appearance. There is no adnexal mass. Other: No intraperitoneal free fluid. Musculoskeletal: No worrisome lytic or sclerotic osseous abnormality. IMPRESSION: 1. No CT findings on this noncontrast exam to suggest recurrent or metastatic disease status post left nephrectomy for renal cell carcinoma. 2. Tiny bilateral pulmonary nodules, likely benign. Previous  CT report from 04/18/2016 at [facility] in [facility] everywhere documented tiny left lower lobe pulmonary nodules. Continued attention on follow-up suggested. 3. Prior Nissen fundoplication and cholecystectomy. 4.  Aortic Atherosclerois (ICD10-170.0) Electronically Signed   By: Misty Stanley M.D.   On: 03/02/2018 08:56       Assessment & Plan:   Problem List Items Addressed This Visit    Anemia    Off iron.  Seeing oncology.        B12 deficiency    On oral supplements.  Recheck B12 level to confirm wnl.       Relevant Orders   Vitamin B12   Depression    Doing well on current regimen.  Follow.        Diabetes mellitus ([place])    Low carb diet and exercise.  Follow met b and a1c.        Relevant Orders   Hemoglobin A1c   Diarrhea    Persistent.  Seeing GI.  Has had extensive GI w/up.  Follow.  Stable.        H/O supraventricular tachycardia    S/p ablation.  Has seen cardiology.  Stable.  Follow.        Hypercholesteremia    On crestor.  Low cholesterol diet and exercise.  Follow lipid panel and liver function tests.        Relevant Orders   Hepatic function panel   Lipid panel   Hypertension    Blood pressure under good control.  Continue same medication regimen.  Follow pressures.  Follow metabolic panel.        Relevant Orders   Basic metabolic panel   Nasal lesion    Persistent.  Used bactroban.  Has seen ENT.   Still persistent.  She will f/u with ENT.        Personal history of renal cell carcinoma    S/p left radical nephrectomy and partial adrenalectomy.  Was followed by Dr [name].  Now seeing Dr Tasia Catchings.  Due f/u CT scan this month.  Scheduled.  Continue to f/u with oncology.  Follow renal function.        Vitamin D deficiency    Low vitamin D level on last check.  Recheck.        Relevant Orders   VITAMIN D 25 Hydroxy (Vit-D Deficiency, Fractures)    Other Visit Diagnoses    Breast lesion    -  Primary   Appears to be c/w keratosis.  Has seen dermatology.  Will f/u to have them evaluate.    Need for immunization against influenza       Relevant Orders   Flu Vaccine QUAD 36+ mos IM (Completed)       Einar Pheasant, MD

## 2018-08-09 ENCOUNTER — Encounter: Payer: Self-pay | Admitting: Internal Medicine

## 2018-08-09 DIAGNOSIS — E538 Deficiency of other specified B group vitamins: Secondary | ICD-10-CM | POA: Insufficient documentation

## 2018-08-09 DIAGNOSIS — E559 Vitamin D deficiency, unspecified: Secondary | ICD-10-CM | POA: Insufficient documentation

## 2018-08-09 NOTE — Assessment & Plan Note (Signed)
On crestor.  Low cholesterol diet and exercise.  Follow lipid panel and liver function tests.   

## 2018-08-09 NOTE — Assessment & Plan Note (Signed)
Persistent.  Seeing GI.  Has had extensive GI w/up.  Follow.  Stable.

## 2018-08-09 NOTE — Assessment & Plan Note (Signed)
S/p left radical nephrectomy and partial adrenalectomy.  Was followed by Dr [name].  Now seeing Dr Tasia Catchings.  Due f/u CT scan this month.  Scheduled.  Continue to f/u with oncology.  Follow renal function.

## 2018-08-09 NOTE — Assessment & Plan Note (Signed)
Off iron.  Seeing oncology.

## 2018-08-09 NOTE — Assessment & Plan Note (Signed)
Persistent.  Used bactroban.  Has seen ENT.  Still persistent.  She will f/u with ENT.

## 2018-08-09 NOTE — Assessment & Plan Note (Signed)
S/p ablation.  Has seen cardiology.  Stable.  Follow.   

## 2018-08-09 NOTE — Assessment & Plan Note (Signed)
Doing well on current regimen.  Follow.   

## 2018-08-09 NOTE — Assessment & Plan Note (Signed)
Blood pressure under good control.  Continue same medication regimen.  Follow pressures.  Follow metabolic panel.   

## 2018-08-09 NOTE — Assessment & Plan Note (Signed)
On oral supplements.  Recheck B12 level to confirm wnl.

## 2018-08-09 NOTE — Assessment & Plan Note (Signed)
Low vitamin D level on last check.  Recheck.

## 2018-08-09 NOTE — Assessment & Plan Note (Signed)
Low carb diet and exercise.  Follow met b and a1c.   

## 2018-08-14 ENCOUNTER — Inpatient Hospital Stay: Payer: 59 | Attending: Oncology

## 2018-08-14 ENCOUNTER — Ambulatory Visit: Payer: 59

## 2018-08-14 ENCOUNTER — Ambulatory Visit
Admission: RE | Admit: 2018-08-14 | Discharge: 2018-08-14 | Disposition: A | Discharge status: Home / Self Care | Payer: 59 | Source: Ambulatory Visit | Attending: Oncology | Admitting: Oncology

## 2018-08-14 DIAGNOSIS — R790 Abnormal level of blood mineral: Secondary | ICD-10-CM

## 2018-08-14 DIAGNOSIS — Z85528 Personal history of other malignant neoplasm of kidney: Secondary | ICD-10-CM | POA: Insufficient documentation

## 2018-08-14 DIAGNOSIS — C642 Malignant neoplasm of left kidney, except renal pelvis: Secondary | ICD-10-CM | POA: Diagnosis not present

## 2018-08-14 LAB — COMPREHENSIVE METABOLIC PANEL
ALT: 14 U/L (ref 0–44)
AST: 17 U/L (ref 15–41)
Albumin: 4.1 g/dL (ref 3.5–5.0)
Alkaline Phosphatase: 73 U/L (ref 38–126)
Anion gap: 7 (ref 5–15)
BUN: 25 mg/dL — ABNORMAL HIGH (ref 6–20)
CO2: 27 mmol/L (ref 22–32)
Calcium: 9.1 mg/dL (ref 8.9–10.3)
Chloride: 102 mmol/L (ref 98–111)
Creatinine, Ser: 1.05 mg/dL — ABNORMAL HIGH (ref 0.44–1.00)
GFR calc Af Amer: >60 mL/min (ref >60)
GFR calc non Af Amer: 58 mL/min — ABNORMAL LOW (ref >60)
Glucose, Bld: 96 mg/dL (ref 70–99)
Potassium: 4.2 mmol/L (ref 3.5–5.1)
Sodium: 136 mmol/L (ref 135–145)
Total Bilirubin: 0.7 mg/dL (ref 0.3–1.2)
Total Protein: 6.7 g/dL (ref 6.5–8.1)

## 2018-08-14 LAB — CBC WITH DIFFERENTIAL/PLATELET
Abs Immature Granulocytes: 0.01 10*3/uL (ref 0.00–0.07)
Basophils Absolute: 0 10*3/uL (ref 0.0–0.1)
Basophils Relative: 0 %
Eosinophils Absolute: 0.1 10*3/uL (ref 0.0–0.5)
Eosinophils Relative: 3 %
HCT: 36.9 % (ref 36.0–46.0)
HEMOGLOBIN: 12.3 g/dL (ref 12.0–15.0)
Immature Granulocytes: 0 %
LYMPHS PCT: 35 %
Lymphs Abs: 1.8 10*3/uL (ref 0.7–4.0)
MCH: 28.5 pg (ref 26.0–34.0)
MCHC: 33.3 g/dL (ref 30.0–36.0)
MCV: 85.6 fL (ref 80.0–100.0)
Monocytes Absolute: 0.3 10*3/uL (ref 0.1–1.0)
Monocytes Relative: 6 %
Neutro Abs: 2.9 10*3/uL (ref 1.7–7.7)
Neutrophils Relative %: 56 %
Platelets: 189 10*3/uL (ref 150–400)
RBC: 4.31 MIL/uL (ref 3.87–5.11)
RDW: 12.4 % (ref 11.5–15.5)
WBC: 5.1 10*3/uL (ref 4.0–10.5)
nRBC: 0 % (ref 0.0–0.2)

## 2018-08-14 LAB — IRON AND TIBC
Iron: 68 ug/dL (ref 28–170)
Saturation Ratios: 20 % (ref 10.4–31.8)
TIBC: 337 ug/dL (ref 250–450)
UIBC: 269 ug/dL

## 2018-08-14 LAB — FERRITIN: Ferritin: 26 ng/mL (ref 11–307)

## 2018-08-20 LAB — HEMOCHROMATOSIS DNA-PCR(C282Y,H63D)

## 2018-08-21 ENCOUNTER — Other Ambulatory Visit: Payer: Self-pay

## 2018-08-21 ENCOUNTER — Inpatient Hospital Stay (HOSPITAL_BASED_OUTPATIENT_CLINIC_OR_DEPARTMENT_OTHER): Payer: 59 | Admitting: Oncology

## 2018-08-21 ENCOUNTER — Encounter: Payer: Self-pay | Admitting: Oncology

## 2018-08-21 VITALS — BP 133/89 | HR 54 | Temp 96.8°F | Resp 18 | Wt 229.8 lb

## 2018-08-21 DIAGNOSIS — Z85528 Personal history of other malignant neoplasm of kidney: Secondary | ICD-10-CM | POA: Diagnosis not present

## 2018-08-21 DIAGNOSIS — R790 Abnormal level of blood mineral: Secondary | ICD-10-CM

## 2018-08-21 NOTE — Progress Notes (Signed)
Patient here for follow up

## 2018-08-22 NOTE — Progress Notes (Signed)
Hematology/Oncology follow up note [facility] [phone] [phone]   Patient Care Team: Einar Pheasant, MD as PCP - General (Internal Medicine)  REFERRING PROVIDER: Einar Pheasant, MD REASON FOR VISIT Follow up for treatment of renal cell carcinoma  HISTORY OF PRESENTING ILLNESS:  Jacqueline Nichols is a  62 y.o.  female with PMH listed below who was referred to me for evaluation of history of renal cell carcinoma Patient follows up with [facility] urology and want to switch her care to local. Extensive medical chart review was performed by me. History of Stage II left renal cell carcinoma.  In 2016, she was found to have a left renal mass 7.9 cm x 2.6 cm x 9.3 cm.  Underwent laparoscopic left radical nephrectomy with Dr.Matthew Reyna on 07/18/2015.  Per Note in care everywhere, patient has clear cell renal cell carcinoma, ISU P nuclear grade 2, pT2a, margins of resections are negative for tumor, no metastatic carcinoma identified in 1 hilar lymph nodes (0/1), background kidney with scattered sclerotic glomeruli; histologically unremarkable adrenal gland.  Patient is currently 2 and 1/2 years after surgery.  She has been having surveillance ultrasound every 6 months.  Today patient denies any cough, weight loss, hematuria, flank pain. Former smoker  INTERVAL HISTORY Jacqueline Nichols is a 62 y.o. female who has above history reviewed by me today presents for follow up visit for management of history of renal cell carcinoma  Doing well at baseline. No new complaints.  She has had CT done during the interval.  Review of Systems  Constitutional: Negative for chills, fever, malaise/fatigue and weight loss.  HENT: Negative for sore throat.   Eyes: Negative for redness.  Respiratory: Negative for cough, shortness of breath and wheezing.   Cardiovascular: Negative for chest pain, palpitations and leg swelling.  Gastrointestinal: Negative for abdominal  pain, blood in stool, nausea and vomiting.  Genitourinary: Negative for dysuria.  Musculoskeletal: Negative for myalgias.  Skin: Negative for rash.  Neurological: Negative for dizziness, tingling and tremors.  Endo/Heme/Allergies: Does not bruise/bleed easily.  Psychiatric/Behavioral: Negative for hallucinations.    MEDICAL HISTORY:  Past Medical History:  Diagnosis Date  . Cancer ([place])    skin  . Chicken pox   . Colon polyps   . Depression   . Diabetes mellitus ([place])   . GERD (gastroesophageal reflux disease)   . Hyperlipidemia   . Hypertension   . 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 Urinary incontinence     SURGICAL HISTORY: Past Surgical History:  Procedure Laterality Date  . CHOLECYSTECTOMY  2001  . COLONOSCOPY WITH PROPOFOL N/A 03/27/2016   Procedure: COLONOSCOPY WITH PROPOFOL;  Surgeon: Lucilla Lame, MD;  Location: [facility] ENDOSCOPY;  Service: Endoscopy;  Laterality: N/A;  . KNEE SURGERY  2003-2009   s/p 8 knee surgeries (with 3 knee replacements)  . NEPHRECTOMY RADICAL  07/2015  . NISSEN FUNDOPLICATION      SOCIAL HISTORY: Social History   Socioeconomic History  . Marital status: Divorced    Spouse name: Not on file  . Number of children: 2  . Years of education: Not on file  . Highest education level: Not on file  Occupational History  . Not on file  Social Needs  . Financial resource strain: Not on file  . Food insecurity:    Worry: Not on file    Inability: Not on file  . Transportation needs:    Medical: Not on file    Non-medical: Not on  file  Tobacco Use  . Smoking status: Former Smoker    Packs/day: 1.00    Years: 15.00    Pack years: 15.00    Types: Cigarettes    Last attempt to quit: 10/23/2012    Years since quitting: 5.8  . Smokeless tobacco: Never Used  . Tobacco comment: Quit Feb 2014  Substance and Sexual Activity  . Alcohol use: Yes    Alcohol/week: 1.0 standard drinks    Types: 1 Glasses of wine per week    Comment: social drinking -  approx 2-4 monthly   . Drug use: No  . Sexual activity: Not Currently  Lifestyle  . Physical activity:    Days per week: Not on file    Minutes per session: Not on file  . Stress: Not on file  Relationships  . Social connections:    Talks on phone: Not on file    Gets together: Not on file    Attends religious service: Not on file    Active member of club or organization: Not on file    Attends meetings of clubs or organizations: Not on file    Relationship status: Not on file  . Intimate partner violence:    Fear of current or ex partner: Not on file    Emotionally abused: Not on file    Physically abused: Not on file    Forced sexual activity: Not on file  Other Topics Concern  . Not on file  Social History Narrative  . Not on file    FAMILY HISTORY: Family History  Problem Relation Age of Onset  . Colon cancer Mother        died - age 5  . Heart disease Father   . Hypertension Father   . Lymphoma Father   . Arthritis/Rheumatoid Sister   . Lung cancer Brother        smoked  . Arthritis/Rheumatoid Paternal Grandmother   . Cancer Maternal Uncle        Unknown type of cancer   . Cancer Paternal Aunt        Unknown type of cancer   . Throat cancer Paternal Uncle   . Colon cancer Paternal Uncle   . Breast cancer Neg Hx     ALLERGIES:  is allergic to codeine and vicodin [hydrocodone-acetaminophen].  MEDICATIONS:  Current Outpatient Medications  Medication Sig Dispense Refill  . bisoprolol-hydrochlorothiazide (ZIAC) 2.5-6.25 MG tablet TAKE 1 TABLET BY MOUTH DAILY FOR HIGH BLOOD PRESSURE 90 tablet 1  . butalbital-acetaminophen-caffeine (FIORICET, ESGIC) 50-325-40 MG tablet TAKE ONE TABLET EACH DAY AS NEEDED FOR HEADACHE 15 tablet 0  . citalopram (CELEXA) 40 MG tablet TAKE 1 TABLET BY MOUTH EVERY DAY 90 tablet 1  . clonazePAM (KLONOPIN) 0.5 MG tablet TAKE 1 TABLET BY MOUTH EVERY DAY AS NEEDED. 30 tablet 0  . clopidogrel (PLAVIX) 75 MG tablet TAKE 1 TABLET BY MOUTH  EVERY DAY 90 tablet 1  . Difenoxin-Atropine 1-0.025 MG TABS Take 1 tablet four times daily as needed for diarrhea 120 each 0  . diphenoxylate-atropine (LOMOTIL) 2.5-0.025 MG tablet Take 1 tablet by mouth 4 (four) times daily as needed for diarrhea or loose stools. 120 tablet 5  . Loperamide HCl (IMODIUM PO) Take by mouth as needed.    . meloxicam (MOBIC) 7.5 MG tablet TAKE ONE TABLET TWICE DAILY 60 tablet 5  . Misc Natural Products (ALLERGY RELEAF SYSTEM PO) Take by mouth. Reported on 03/20/2016    . nystatin cream (MYCOSTATIN) Apply  1 application topically 2 (two) times daily. 30 g 0  . rosuvastatin (CRESTOR) 5 MG tablet TAKE 1 TABLET BY MOUTH EVERY DAY 90 tablet 1  . torsemide (DEMADEX) 20 MG tablet TAKE 1 TABLET BY MOUTH EVERY DAY AS NEEDED. 90 tablet 1  . Vitamin D, Ergocalciferol, (DRISDOL) 50000 units CAPS capsule Take 1 capsule (50,000 Units total) by mouth every 7 (seven) days. 12 capsule 0  . ferrous sulfate 325 (65 FE) MG EC tablet Take 1 tablet (325 mg total) by mouth 2 (two) times daily with a meal. (Patient not taking: Reported on 08/21/2018) 60 tablet 2   No current facility-administered medications for this visit.      PHYSICAL EXAMINATION: ECOG PERFORMANCE STATUS: 0 - Asymptomatic Vitals:   08/21/18 1407  BP: 133/89  Pulse: (!) 54  Resp: 18  Temp: (!) 96.8 F (36 C)   Filed Weights   08/21/18 1407  Weight: 229 lb 12.8 oz (104.2 kg)    Physical Exam Constitutional:      General: She is not in acute distress. HENT:     Head: Normocephalic and atraumatic.  Eyes:     General: No scleral icterus.    Conjunctiva/sclera: Conjunctivae normal.     Pupils: Pupils are equal, round, and reactive to light.  Neck:     Musculoskeletal: Normal range of motion and neck supple.  Cardiovascular:     Rate and Rhythm: Normal rate and regular rhythm.     Heart sounds: Normal heart sounds.  Pulmonary:     Effort: Pulmonary effort is normal. No respiratory distress.     Breath  sounds: Normal breath sounds. No wheezing or rales.  Chest:     Chest wall: No tenderness.  Abdominal:     General: Bowel sounds are normal. There is no distension.     Palpations: Abdomen is soft. There is no mass.     Tenderness: There is no abdominal tenderness.  Musculoskeletal: Normal range of motion.        General: No deformity.  Lymphadenopathy:     Cervical: No cervical adenopathy.  Skin:    General: Skin is warm and dry.     Findings: No erythema or rash.  Neurological:     Mental Status: She is alert and oriented to person, place, and time.     Cranial Nerves: No cranial nerve deficit.     Coordination: Coordination normal.  Psychiatric:        Behavior: Behavior normal.        Thought Content: Thought content normal.      LABORATORY DATA:  I have reviewed the data as listed Lab Results  Component Value Date   WBC 5.1 08/14/2018   HGB 12.3 08/14/2018   HCT 36.9 08/14/2018   MCV 85.6 08/14/2018   PLT 189 08/14/2018   Recent Labs    11/28/17 1130 02/06/18 1444 08/14/18 1341  NA 141 138 136  K 5.1 4.3 4.2  CL 104 100 102  CO2 24 25 27   GLUCOSE 84 90 96  BUN 26 21 25*  CREATININE 1.06* 0.94 1.05*  CALCIUM 9.7 9.4 9.1  GFRNONAA 57* 66 58*  GFRAA 66 76 >60  PROT 6.5 6.3 6.7  ALBUMIN 4.3 4.3 4.1  AST 15 15 17   ALT 14 13 14   ALKPHOS 102 88 73  BILITOT 0.4 0.4 0.7  BILIDIR 0.12 0.11  --    Iron/TIBC/Ferritin/ %Sat    Component Value Date/Time   IRON 68 08/14/2018 1341  TIBC 337 08/14/2018 1341   FERRITIN 26 08/14/2018 1341   IRONPCTSAT 20 08/14/2018 1341     Reviewed lab work which was done at PCPs office on 02/03/2018. Creatinine 0.83, sodium 139, potassium 4.3, calcium 9.7, bilirubin 0.4, AST 14 ALT 11 WBC 6, hemoglobin 10.9, MCV 79, platelet 223,000, normal differential. Ferritin 11  ASSESSMENT & PLAN:  1. History of kidney cancer   2. Abnormal iron saturation    # History of Stage II renal cell carcinoma, per NCCN guideline,  Suggests  follow up every 3-6 months for 3 years, then annually for 5 years.  CBC CMP every 6 months x 2 y, then annually.  Abd Image every 6 months x 3 years and then annually up to 5 years Chest CT every 6 months x 3 years then annually.   CT chest abdomen pelvis wo contrast was independantly reviewed by me and discussed with patient.  Stable exam, no findings identified to suggest recurrent or metastatic disease.  Tiny lung nodules are unchanged.  Liver cysts.  Next CT image due in 1 year.   #Elevated iron saturation, off iron supplementation. Repeat levels showed normalized iron saturation.  Negative hemochromatosis.   Orders Placed This Encounter  Procedures  . CT Chest Wo Contrast    Standing Status:   Future    Standing Expiration Date:   02/20/2020    Order Specific Question:   Is patient pregnant?    Answer:   No    Order Specific Question:   Preferred imaging location?    Answer:   [facility]    Order Specific Question:   Radiology Contrast Protocol - do NOT remove file path    Answer:   \\[email or web address].pdf  . CT Abdomen Pelvis Wo Contrast    Standing Status:   Future    Standing Expiration Date:   02/20/2020    Order Specific Question:   Is patient pregnant?    Answer:   No    Order Specific Question:   Preferred imaging location?    Answer:   [facility]    Order Specific Question:   Is Oral Contrast requested for this exam?    Answer:   Yes, Per Radiology protocol    Order Specific Question:   Radiology Contrast Protocol - do NOT remove file path    Answer:   \\[email or web address].pdf    All questions were answered. The patient knows to call the clinic with any problems questions or concerns.  Return of visit: 1 year.  Total face to face encounter time for this patient visit was 69min. >50% of the time was  spent in counseling and coordination of care.  [name], MD, PhD Hematology Oncology [facility]  at [facility] Pager- [phone] 08/22/2018

## 2018-09-07 ENCOUNTER — Other Ambulatory Visit: Payer: Self-pay | Admitting: Gastroenterology

## 2018-09-07 ENCOUNTER — Other Ambulatory Visit: Payer: Self-pay | Admitting: Internal Medicine

## 2018-09-09 NOTE — Telephone Encounter (Signed)
rx ok'd for clonzaepam #30 with one refill.

## 2018-09-09 NOTE — Telephone Encounter (Signed)
Colletta Maryland called with [employer] checking on Lomotil prescription for patient.

## 2018-10-12 DIAGNOSIS — H04123 Dry eye syndrome of bilateral lacrimal glands: Secondary | ICD-10-CM | POA: Diagnosis not present

## 2018-10-12 DIAGNOSIS — H1045 Other chronic allergic conjunctivitis: Secondary | ICD-10-CM | POA: Diagnosis not present

## 2018-11-06 ENCOUNTER — Other Ambulatory Visit: Payer: Self-pay | Admitting: Internal Medicine

## 2018-12-25 ENCOUNTER — Telehealth: Payer: Self-pay | Admitting: Gastroenterology

## 2018-12-25 NOTE — Telephone Encounter (Signed)
Pt left vm for Ginger regarding her FMLA  papers she states she has a new Leave # she would like to give to Ginger to [place] out paper work please call pt

## 2018-12-25 NOTE — Telephone Encounter (Signed)
Spoke with pt regarding her FMLA paperwork. Pt will email it to me today.

## 2019-01-11 ENCOUNTER — Other Ambulatory Visit: Payer: Self-pay | Admitting: Internal Medicine

## 2019-01-13 ENCOUNTER — Other Ambulatory Visit: Payer: Self-pay | Admitting: Internal Medicine

## 2019-01-13 MED ORDER — CLONAZEPAM 0.5 MG PO TABS: 0.5000 mg | ORAL_TABLET | Freq: Every day | ORAL | 1 refills | Status: DC | PRN

## 2019-01-13 NOTE — Progress Notes (Signed)
Unable to e prescribe clonazepam.

## 2019-01-13 NOTE — Progress Notes (Signed)
rx ok'd for clonazepam #30 with one refill.   

## 2019-01-13 NOTE — Telephone Encounter (Signed)
Joy, from pharmacy,  called stating she is waiting on request to refill clonazepam. Please advise  [phone]

## 2019-01-13 NOTE — Telephone Encounter (Signed)
Last OV 08/07/18 Next OV not scheduled. Due for cpe this month Last refill 09/09/18 #30 with 1 refill

## 2019-01-13 NOTE — Progress Notes (Signed)
Unable to e prescribe the rx.  Will try again.

## 2019-01-15 ENCOUNTER — Encounter: Payer: 59 | Admitting: Internal Medicine

## 2019-04-11 ENCOUNTER — Emergency Department
Admission: EM | Admit: 2019-04-11 | Discharge: 2019-04-12 | Disposition: A | Discharge status: Home / Self Care | Payer: 59 | Attending: Emergency Medicine | Admitting: Emergency Medicine

## 2019-04-11 ENCOUNTER — Emergency Department: Payer: 59

## 2019-04-11 ENCOUNTER — Other Ambulatory Visit: Payer: Self-pay

## 2019-04-11 DIAGNOSIS — Z87891 Personal history of nicotine dependence: Secondary | ICD-10-CM | POA: Insufficient documentation

## 2019-04-11 DIAGNOSIS — I10 Essential (primary) hypertension: Secondary | ICD-10-CM | POA: Insufficient documentation

## 2019-04-11 DIAGNOSIS — E119 Type 2 diabetes mellitus without complications: Secondary | ICD-10-CM | POA: Insufficient documentation

## 2019-04-11 DIAGNOSIS — R55 Syncope and collapse: Secondary | ICD-10-CM

## 2019-04-11 DIAGNOSIS — R531 Weakness: Secondary | ICD-10-CM

## 2019-04-11 DIAGNOSIS — Z7902 Long term (current) use of antithrombotics/antiplatelets: Secondary | ICD-10-CM | POA: Diagnosis not present

## 2019-04-11 DIAGNOSIS — Z79899 Other long term (current) drug therapy: Secondary | ICD-10-CM | POA: Diagnosis not present

## 2019-04-11 LAB — URINALYSIS, COMPLETE (UACMP) WITH MICROSCOPIC
Bacteria, UA: NONE SEEN
Bilirubin Urine: NEGATIVE
Glucose, UA: NEGATIVE mg/dL
Hgb urine dipstick: NEGATIVE
Ketones, ur: NEGATIVE mg/dL
Nitrite: NEGATIVE
Protein, ur: NEGATIVE mg/dL
Specific Gravity, Urine: 1.013 (ref 1.005–1.030)
pH: 5 (ref 5.0–8.0)

## 2019-04-11 LAB — CBC
HCT: 37.4 % (ref 36.0–46.0)
Hemoglobin: 12.5 g/dL (ref 12.0–15.0)
MCH: 29.3 pg (ref 26.0–34.0)
MCHC: 33.4 g/dL (ref 30.0–36.0)
MCV: 87.8 fL (ref 80.0–100.0)
Platelets: 190 10*3/uL (ref 150–400)
RBC: 4.26 MIL/uL (ref 3.87–5.11)
RDW: 12.6 % (ref 11.5–15.5)
WBC: 6.4 10*3/uL (ref 4.0–10.5)
nRBC: 0 % (ref 0.0–0.2)

## 2019-04-11 LAB — BASIC METABOLIC PANEL
Anion gap: 12 (ref 5–15)
BUN: 19 mg/dL (ref 8–23)
CO2: 25 mmol/L (ref 22–32)
Calcium: 9.1 mg/dL (ref 8.9–10.3)
Chloride: 102 mmol/L (ref 98–111)
Creatinine, Ser: 1.18 mg/dL — ABNORMAL HIGH (ref 0.44–1.00)
GFR calc Af Amer: 58 mL/min — ABNORMAL LOW (ref >60)
GFR calc non Af Amer: 50 mL/min — ABNORMAL LOW (ref >60)
Glucose, Bld: 95 mg/dL (ref 70–99)
Potassium: 3.4 mmol/L — ABNORMAL LOW (ref 3.5–5.1)
Sodium: 139 mmol/L (ref 135–145)

## 2019-04-11 LAB — TROPONIN I (HIGH SENSITIVITY): Troponin I (High Sensitivity): 3 ng/L (ref <18)

## 2019-04-11 MED ORDER — SODIUM CHLORIDE 0.9 % IV BOLUS
1000.0000 mL | Freq: Once | INTRAVENOUS | Status: AC
  Administered 2019-04-11: 22:00:00 1000 mL via INTRAVENOUS

## 2019-04-11 NOTE — ED Triage Notes (Signed)
Pt arrives via [facility] from home after having an episode of weakness and near syncope while sitting at the table- no other complaints- pt has a hx of stroke with right sided deficits

## 2019-04-11 NOTE — ED Provider Notes (Signed)
[facility] Emergency Department Provider Note  Time seen: 9:39 PM  I have reviewed the triage vital signs and the nursing notes.   HISTORY  Chief Complaint Weakness   HPI CHER FRANZONI is a 63 y.o. female with a past medical history of depression, diabetes, gastric reflux, hypertension, hyperlipidemia, prior CVA with right-sided deficits presents to the emergency department after near syncopal episode.  According to the patient she was at home this evening when she began experiencing a burning sensation in the upper abdomen followed by feeling of lightheadedness, was able to sit down but felt like she was going to pass out.  Denies any chest pain denies any diaphoresis or nausea.  Patient did state since the event she has been experiencing some discomfort such as a tightness in her throat.  Patient also states she has developed a headache since the event as well but denies falling or hitting her head.  Currently the patient appears well, no acute distress, reassuring vitals with an overall reassuring physical exam.   Past Medical History:  Diagnosis Date  . Cancer ([place])    skin  . Chicken pox   . Colon polyps   . Depression   . Diabetes mellitus ([place])   . GERD (gastroesophageal reflux disease)   . Hyperlipidemia   . Hypertension   . 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 Urinary incontinence     Patient Active Problem List   Diagnosis Date Noted  . Vitamin D deficiency 08/09/2018  . B12 deficiency 08/09/2018  . SVT (supraventricular tachycardia) ([place]) 01/05/2018  . Personal history of renal cell carcinoma 08/20/2017  . Headache 06/22/2017  . Status post total bilateral knee replacement 06/04/2017  . History of IBS 05/21/2016  . Depression 05/08/2016  . Noninfectious diarrhea   . Benign neoplasm of sigmoid colon   . H/O supraventricular tachycardia 03/20/2016  . Renal cell carcinoma ([place]) 06/12/2015  . Renal mass, left 06/02/2015  . Mixed stress and urge  urinary incontinence 06/02/2015  . Fatigue 05/07/2015  . Health care maintenance 05/07/2015  . Nasal lesion 01/30/2014  . Elevated alkaline phosphatase level 10/17/2013  . Bradycardia 08/22/2013  . Diarrhea 04/18/2013  . Tachycardia 10/25/2012  . Anemia 10/25/2012  . Diabetes mellitus ([place]) 06/28/2012  . Hypertension 06/28/2012  . Hypercholesteremia 06/28/2012  . GERD (gastroesophageal reflux disease) 06/28/2012  . IBS (irritable bowel syndrome) 06/28/2012    Past Surgical History:  Procedure Laterality Date  . CHOLECYSTECTOMY  2001  . COLONOSCOPY WITH PROPOFOL N/A 03/27/2016   Procedure: COLONOSCOPY WITH PROPOFOL;  Surgeon: Lucilla Lame, MD;  Location: [facility] ENDOSCOPY;  Service: Endoscopy;  Laterality: N/A;  . KNEE SURGERY  2003-2009   s/p 8 knee surgeries (with 3 knee replacements)  . NEPHRECTOMY RADICAL  07/2015  . NISSEN FUNDOPLICATION      Prior to Admission medications   Medication Sig Start Date End Date Taking? Authorizing Provider  bisoprolol-hydrochlorothiazide [facility]) 2.5-6.25 MG tablet TAKE 1 TABLET BY MOUTH DAILY FOR HIGH BLOOD PRESSURE 09/08/18   Einar Pheasant, MD  butalbital-acetaminophen-caffeine (FIORICET, ESGIC) 805 431 1286 MG tablet TAKE ONE TABLET EACH DAY AS NEEDED FOR HEADACHE 06/26/17   Einar Pheasant, MD  citalopram (CELEXA) 40 MG tablet TAKE 1 TABLET BY MOUTH DAILY 09/08/18   Einar Pheasant, MD  clonazePAM (KLONOPIN) 0.5 MG tablet Take 1 tablet (0.5 mg total) by mouth daily as needed. 01/13/19   Einar Pheasant, MD  clopidogrel (PLAVIX) 75 MG tablet TAKE 1 TABLET BY MOUTH EVERY DAY 01/13/19  Einar Pheasant, MD  Difenoxin-Atropine 1-0.025 MG TABS Take 1 tablet four times daily as needed for diarrhea 07/07/18   Lucilla Lame, MD  diphenoxylate-atropine (LOMOTIL) 2.5-0.025 MG tablet TAKE 1 TABLET BY MOUTH 4 TIMES DAILY AS NEEDED FOR DIARRHEA OR LOOSE STOOLS 09/09/18   Lucilla Lame, MD  ferrous sulfate 325 (65 FE) MG EC tablet Take 1 tablet (325 mg total) by mouth 2  (two) times daily with a meal. Patient not taking: Reported on 08/21/2018 02/20/18   [name], MD  Loperamide HCl (IMODIUM PO) Take by mouth as needed.    [provider]  meloxicam (MOBIC) 7.5 MG tablet TAKE ONE TABLET TWICE DAILY 10/19/15   Einar Pheasant, MD  Misc Natural Products (ALLERGY RELEAF SYSTEM PO) Take by mouth. Reported on 03/20/2016    [provider]  nystatin cream (MYCOSTATIN) Apply 1 application topically 2 (two) times daily. 06/26/17   Einar Pheasant, MD  rosuvastatin (CRESTOR) 5 MG tablet TAKE 1 TABLET BY MOUTH DAILY 09/08/18   Einar Pheasant, MD  torsemide (DEMADEX) 20 MG tablet TAKE 1 TABLET BY MOUTH EVERY DAY AS NEEDED. 11/06/18   Einar Pheasant, MD  Vitamin D, Ergocalciferol, (DRISDOL) 50000 units CAPS capsule Take 1 capsule (50,000 Units total) by mouth every 7 (seven) days. 02/13/18   Einar Pheasant, MD    Allergies  Allergen Reactions  . Codeine Nausea Only  . Vicodin [Hydrocodone-Acetaminophen] Itching    Family History  Problem Relation Age of Onset  . Colon cancer Mother        died - age 28  . Heart disease Father   . Hypertension Father   . Lymphoma Father   . Arthritis/Rheumatoid Sister   . Lung cancer Brother        smoked  . Arthritis/Rheumatoid Paternal Grandmother   . Cancer Maternal Uncle        Unknown type of cancer   . Cancer Paternal Aunt        Unknown type of cancer   . Throat cancer Paternal Uncle   . Colon cancer Paternal Uncle   . Breast cancer Neg Hx     Social History Social History   Tobacco Use  . Smoking status: Former Smoker    Packs/day: 1.00    Years: 15.00    Pack years: 15.00    Types: Cigarettes    Quit date: 10/23/2012    Years since quitting: 6.4  . Smokeless tobacco: Never Used  . Tobacco comment: Quit Feb 2014  Substance Use Topics  . Alcohol use: Yes    Alcohol/week: 1.0 standard drinks    Types: 1 Glasses of wine per week    Comment: social drinking - approx 2-4 monthly   . Drug use:  No    Review of Systems Constitutional: Negative for fever. Cardiovascular: Negative for chest pain. Respiratory: Negative for shortness of breath.  Negative for cough. Gastrointestinal: Negative for abdominal pain, vomiting Musculoskeletal: Negative for musculoskeletal complaints Neurological: Moderate headache All other ROS negative  ____________________________________________   PHYSICAL EXAM:  VITAL SIGNS: ED Triage Vitals  Enc Vitals Group     BP 04/11/19 2048 106/63     Pulse Rate 04/11/19 2045 (!) 59     Resp 04/11/19 2047 12     Temp 04/11/19 2047 97.7 F (36.5 C)     Temp Source 04/11/19 2047 Oral     SpO2 04/11/19 2045 98 %     Weight 04/11/19 2045 225 lb (102.1 kg)  Height 04/11/19 2045 5\' 10"  (1.778 m)     Head Circumference --      Peak Flow --      Pain Score 04/11/19 2045 3     Pain Loc --      Pain Edu? --      Excl. in [place]? --    Constitutional: Alert and oriented. Well appearing and in no distress. Eyes: Normal exam ENT      Head: Normocephalic and atraumatic.      Mouth/Throat: Mucous membranes are moist. Cardiovascular: Normal rate, regular rhythm.  Respiratory: Normal respiratory effort without tachypnea nor retractions. Breath sounds are clear  Gastrointestinal: Soft and nontender. No distention.   Musculoskeletal: Nontender with normal range of motion in all extremities.  Neurologic:  Normal speech and language.  States chronic right-sided deficits. Skin:  Skin is warm, dry and intact.  Psychiatric: Mood and affect are normal.   ____________________________________________    EKG  EKG viewed and interpreted by myself shows sinus arrhythmia at 58 bpm with a narrow QRS, normal axis, normal intervals, no concerning ST changes  ____________________________________________    RADIOLOGY  CT scan of the head shows old changes but no acute abnormalities.  ____________________________________________   INITIAL IMPRESSION / ASSESSMENT  AND PLAN / ED COURSE  Pertinent labs & imaging results that were available during my care of the patient were reviewed by me and considered in my medical decision making (see chart for details).   Patient presents to the emergency department for a near syncopal episode.  Given the patient's complaint throat tightness we will check cardiac enzymes and EKG.  We will IV hydrate, check basic labs including a urinalysis and continue to closely monitor in the emergency department.  Given the patient's complaint of mild to moderate headache with history of CVA in the past we will also obtain a CT scan of the head as a precaution.  Patient agreeable to plan of care.  Patient's work-up is thus far reassuring.  Urinalysis is normal.  Blood work is largely at baseline, troponin negative CT scan negative.  Overall the patient appears well, after IV fluids the patient is feeling better.  We will repeat a troponin.  If the patient's repeat troponin remains normal anticipate likely discharge home with PCP follow-up.  Patient agreeable to plan of care.  Patient care signed out to Dr. Owens Shark.  Jacqueline Nichols was evaluated in Emergency Department on 04/11/2019 for the symptoms described in the history of present illness. She was evaluated in the context of the global COVID-19 pandemic, which necessitated consideration that the patient might be at risk for infection with the SARS-CoV-2 virus that causes COVID-19. Institutional protocols and algorithms that pertain to the evaluation of patients at risk for COVID-19 are in a state of rapid change based on information released by regulatory bodies including the CDC and federal and state organizations. These policies and algorithms were followed during the patient's care in the ED.  ____________________________________________   FINAL CLINICAL IMPRESSION(S) / ED DIAGNOSES  Near syncope   Harvest Dark, MD 04/11/19 2318

## 2019-04-11 NOTE — ED Notes (Signed)
Visitor at bedside.

## 2019-04-11 NOTE — ED Notes (Signed)
Pt states she had 2 glasses of wine with dinner

## 2019-04-12 LAB — TROPONIN I (HIGH SENSITIVITY): Troponin I (High Sensitivity): 3 ng/L (ref <18)

## 2019-04-15 ENCOUNTER — Other Ambulatory Visit: Payer: Self-pay | Admitting: Gastroenterology

## 2019-06-22 ENCOUNTER — Other Ambulatory Visit: Payer: Self-pay | Admitting: Internal Medicine

## 2019-06-22 ENCOUNTER — Telehealth: Payer: Self-pay | Admitting: *Deleted

## 2019-06-22 DIAGNOSIS — Z1231 Encounter for screening mammogram for malignant neoplasm of breast: Secondary | ICD-10-CM

## 2019-06-22 NOTE — Telephone Encounter (Signed)
Copied from [place] [phone] [id number]. Topic: General - Other >> Jun 22, 2019 12:40 PM Pauline Good wrote: Reason for CRM: pt need orders for mammogram at [facility].

## 2019-06-22 NOTE — Telephone Encounter (Signed)
Order placed for mammogram.

## 2019-06-25 ENCOUNTER — Other Ambulatory Visit: Payer: Self-pay

## 2019-06-25 ENCOUNTER — Ambulatory Visit
Admission: RE | Admit: 2019-06-25 | Discharge: 2019-06-25 | Disposition: A | Discharge status: Home / Self Care | Payer: 59 | Source: Ambulatory Visit | Attending: Internal Medicine | Admitting: Internal Medicine

## 2019-06-25 DIAGNOSIS — Z1231 Encounter for screening mammogram for malignant neoplasm of breast: Secondary | ICD-10-CM

## 2019-08-16 ENCOUNTER — Telehealth: Payer: Self-pay

## 2019-08-16 NOTE — Telephone Encounter (Signed)
Pt called stating that she wanted to reschedule CT to January. She will not be able to do this week due to family issues. She also requested lab and MD visit to be rescheduled to Jan as well.

## 2019-08-20 ENCOUNTER — Ambulatory Visit: Payer: 59

## 2019-08-23 ENCOUNTER — Ambulatory Visit: Payer: 59 | Admitting: Oncology

## 2019-08-23 ENCOUNTER — Other Ambulatory Visit: Payer: 59

## 2019-09-20 ENCOUNTER — Inpatient Hospital Stay: Payer: 59 | Attending: Oncology

## 2019-09-20 ENCOUNTER — Other Ambulatory Visit: Payer: Self-pay | Admitting: Internal Medicine

## 2019-09-20 ENCOUNTER — Ambulatory Visit
Admission: RE | Admit: 2019-09-20 | Discharge: 2019-09-20 | Disposition: A | Discharge status: Home / Self Care | Payer: 59 | Source: Ambulatory Visit | Attending: Oncology | Admitting: Oncology

## 2019-09-20 ENCOUNTER — Other Ambulatory Visit: Payer: Self-pay

## 2019-09-20 ENCOUNTER — Ambulatory Visit: Payer: 59

## 2019-09-20 DIAGNOSIS — D509 Iron deficiency anemia, unspecified: Secondary | ICD-10-CM

## 2019-09-20 DIAGNOSIS — Z85528 Personal history of other malignant neoplasm of kidney: Secondary | ICD-10-CM | POA: Insufficient documentation

## 2019-09-20 DIAGNOSIS — R160 Hepatomegaly, not elsewhere classified: Secondary | ICD-10-CM | POA: Insufficient documentation

## 2019-09-20 LAB — CBC WITH DIFFERENTIAL/PLATELET
Abs Immature Granulocytes: 0.01 10*3/uL (ref 0.00–0.07)
Basophils Absolute: 0 10*3/uL (ref 0.0–0.1)
Basophils Relative: 0 %
Eosinophils Absolute: 0.1 10*3/uL (ref 0.0–0.5)
Eosinophils Relative: 2 %
HCT: 40.1 % (ref 36.0–46.0)
Hemoglobin: 12.9 g/dL (ref 12.0–15.0)
Immature Granulocytes: 0 %
Lymphocytes Relative: 39 %
Lymphs Abs: 2 10*3/uL (ref 0.7–4.0)
MCH: 29.3 pg (ref 26.0–34.0)
MCHC: 32.2 g/dL (ref 30.0–36.0)
MCV: 90.9 fL (ref 80.0–100.0)
Monocytes Absolute: 0.3 10*3/uL (ref 0.1–1.0)
Monocytes Relative: 6 %
Neutro Abs: 2.8 10*3/uL (ref 1.7–7.7)
Neutrophils Relative %: 53 %
Platelets: 215 10*3/uL (ref 150–400)
RBC: 4.41 MIL/uL (ref 3.87–5.11)
RDW: 13.6 % (ref 11.5–15.5)
WBC: 5.2 10*3/uL (ref 4.0–10.5)
nRBC: 0 % (ref 0.0–0.2)

## 2019-09-20 LAB — COMPREHENSIVE METABOLIC PANEL
ALT: 19 U/L (ref 0–44)
AST: 17 U/L (ref 15–41)
Albumin: 4.2 g/dL (ref 3.5–5.0)
Alkaline Phosphatase: 87 U/L (ref 38–126)
Anion gap: 7 (ref 5–15)
BUN: 22 mg/dL (ref 8–23)
CO2: 25 mmol/L (ref 22–32)
Calcium: 9.2 mg/dL (ref 8.9–10.3)
Chloride: 105 mmol/L (ref 98–111)
Creatinine, Ser: 0.95 mg/dL (ref 0.44–1.00)
GFR calc Af Amer: >60 mL/min (ref >60)
GFR calc non Af Amer: >60 mL/min (ref >60)
Glucose, Bld: 98 mg/dL (ref 70–99)
Potassium: 4.3 mmol/L (ref 3.5–5.1)
Sodium: 137 mmol/L (ref 135–145)
Total Bilirubin: 0.7 mg/dL (ref 0.3–1.2)
Total Protein: 7 g/dL (ref 6.5–8.1)

## 2019-09-22 ENCOUNTER — Encounter: Payer: Self-pay | Admitting: Oncology

## 2019-09-22 ENCOUNTER — Inpatient Hospital Stay: Payer: 59

## 2019-09-22 ENCOUNTER — Ambulatory Visit: Payer: 59 | Admitting: Oncology

## 2019-09-22 ENCOUNTER — Inpatient Hospital Stay (HOSPITAL_BASED_OUTPATIENT_CLINIC_OR_DEPARTMENT_OTHER): Payer: 59 | Admitting: Oncology

## 2019-09-22 ENCOUNTER — Other Ambulatory Visit: Payer: 59

## 2019-09-22 DIAGNOSIS — R160 Hepatomegaly, not elsewhere classified: Secondary | ICD-10-CM

## 2019-09-22 DIAGNOSIS — Z85528 Personal history of other malignant neoplasm of kidney: Secondary | ICD-10-CM

## 2019-09-22 NOTE — Telephone Encounter (Signed)
rx ok'd for clonazepam #30 with no refills.  Overdue appt.  Please schedule.  [place] for doxy

## 2019-09-22 NOTE — Progress Notes (Signed)
Patient verified using two identifiers for virtual visit via telephone today.  Patient does not offer any problems today.  

## 2019-09-22 NOTE — Progress Notes (Signed)
HEMATOLOGY-ONCOLOGY TeleHEALTH VISIT PROGRESS NOTE  I connected with Jacqueline Nichols on 09/22/19 at  2:30 PM EST by video enabled telemedicine visit and verified that I am speaking with the correct person using two identifiers. I discussed the limitations, risks, security and privacy concerns of performing an evaluation and management service by telemedicine and the availability of in-person appointments. I also discussed with the patient that there may be a patient responsible charge related to this service. The patient expressed understanding and agreed to proceed.   Other persons participating in the visit and their role in the encounter:  None  Patient's location: Home  Provider's location: office Chief Complaint: Follow-up for [place] is a 64 y.o. female who has above history reviewed by me today presents for follow up visit for management of RCC Problems and complaints are listed below:  Patient denies any new complaints.  She had annual surveillance CT chest abdomen pelvis.  She presents virtually to discuss results.  Review of Systems  Constitutional: Negative for appetite change, chills, fatigue and fever.  HENT:   Negative for hearing loss and voice change.   Eyes: Negative for eye problems.  Respiratory: Negative for chest tightness and cough.   Cardiovascular: Negative for chest pain.  Gastrointestinal: Negative for abdominal distention, abdominal pain and blood in stool.  Endocrine: Negative for hot flashes.  Genitourinary: Negative for difficulty urinating and frequency.   Musculoskeletal: Negative for arthralgias.  Skin: Negative for itching and rash.  Neurological: Negative for extremity weakness.  Hematological: Negative for adenopathy.  Psychiatric/Behavioral: Negative for confusion.    Past Medical History:  Diagnosis Date  . Cancer ([place])    skin  . Chicken pox   . Colon polyps   . Depression   . Diabetes mellitus ([place])   .  GERD (gastroesophageal reflux disease)   . Hyperlipidemia   . Hypertension   . 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 Urinary incontinence    Past Surgical History:  Procedure Laterality Date  . CHOLECYSTECTOMY  2001  . COLONOSCOPY WITH PROPOFOL N/A 03/27/2016   Procedure: COLONOSCOPY WITH PROPOFOL;  Surgeon: Lucilla Lame, MD;  Location: [facility] ENDOSCOPY;  Service: Endoscopy;  Laterality: N/A;  . KNEE SURGERY  2003-2009   s/p 8 knee surgeries (with 3 knee replacements)  . NEPHRECTOMY RADICAL  07/2015  . NISSEN FUNDOPLICATION      Family History  Problem Relation Age of Onset  . Colon cancer Mother        died - age 38  . Heart disease Father   . Hypertension Father   . Lymphoma Father   . Arthritis/Rheumatoid Sister   . Lung cancer Brother        smoked  . Arthritis/Rheumatoid Paternal Grandmother   . Cancer Maternal Uncle        Unknown type of cancer   . Cancer Paternal Aunt        Unknown type of cancer   . Throat cancer Paternal Uncle   . Colon cancer Paternal Uncle   . Breast cancer Neg Hx     Social History   Socioeconomic History  . Marital status: Divorced    Spouse name: Not on file  . Number of children: 2  . Years of education: Not on file  . Highest education level: Not on file  Occupational History  . Not on file  Tobacco Use  . Smoking status: Former Smoker    Packs/day: 1.00  Years: 15.00    Pack years: 15.00    Types: Cigarettes    Quit date: 10/23/2012    Years since quitting: 6.9  . Smokeless tobacco: Never Used  . Tobacco comment: Quit Feb 2014  Substance and Sexual Activity  . Alcohol use: Yes    Alcohol/week: 1.0 standard drinks    Types: 1 Glasses of wine per week    Comment: social drinking - approx 2-4 monthly   . Drug use: No  . Sexual activity: Not Currently  Other Topics Concern  . Not on file  Social History Narrative  . Not on file   Social Determinants of Health   Financial Resource Strain:   . Difficulty of Paying Living  Expenses: Not on file  Food Insecurity:   . Worried About Charity fundraiser in the Last Year: Not on file  . Ran Out of Food in the Last Year: Not on file  Transportation Needs:   . Lack of Transportation (Medical): Not on file  . Lack of Transportation (Non-Medical): Not on file  Physical Activity:   . Days of Exercise per Week: Not on file  . Minutes of Exercise per Session: Not on file  Stress:   . Feeling of Stress : Not on file  Social Connections:   . Frequency of Communication with Friends and Family: Not on file  . Frequency of Social Gatherings with Friends and Family: Not on file  . Attends Religious Services: Not on file  . Active Member of Clubs or Organizations: Not on file  . Attends Archivist Meetings: Not on file  . Marital Status: Not on file  Intimate Partner Violence:   . Fear of Current or Ex-Partner: Not on file  . Emotionally Abused: Not on file  . Physically Abused: Not on file  . Sexually Abused: Not on file    Current Outpatient Medications on File Prior to Visit  Medication Sig Dispense Refill  . bisoprolol-hydrochlorothiazide (ZIAC) 2.5-6.25 MG tablet TAKE 1 TABLET BY MOUTH DAILY FOR HIGH BLOOD PRESSURE 90 tablet 1  . butalbital-acetaminophen-caffeine (FIORICET, ESGIC) 50-325-40 MG tablet TAKE ONE TABLET EACH DAY AS NEEDED FOR HEADACHE 15 tablet 0  . citalopram (CELEXA) 40 MG tablet TAKE 1 TABLET BY MOUTH DAILY 90 tablet 1  . clonazePAM (KLONOPIN) 0.5 MG tablet TAKE 1 TABLET BY MOUTH EVERY DAY AS NEEDED 30 tablet 0  . clopidogrel (PLAVIX) 75 MG tablet TAKE 1 TABLET BY MOUTH EVERY DAY 90 tablet 1  . diphenoxylate-atropine (LOMOTIL) 2.5-0.025 MG tablet TAKE 1 TABLET BY MOUTH 4 TIMES DAILY AS NEEDED FOR DIARRHEA OR LOOSE STOOLS 120 tablet 5  . Loperamide HCl (IMODIUM PO) Take by mouth as needed.    . meloxicam (MOBIC) 7.5 MG tablet TAKE ONE TABLET TWICE DAILY 60 tablet 5  . Misc Natural Products (ALLERGY RELEAF SYSTEM PO) Take by mouth. Reported  on 03/20/2016    . nystatin cream (MYCOSTATIN) Apply 1 application topically 2 (two) times daily. 30 g 0  . rosuvastatin (CRESTOR) 5 MG tablet TAKE 1 TABLET BY MOUTH DAILY 90 tablet 1  . torsemide (DEMADEX) 20 MG tablet TAKE 1 TABLET BY MOUTH EVERY DAY AS NEEDED. 90 tablet 1   No current facility-administered medications on file prior to visit.    Allergies  Allergen Reactions  . Codeine Nausea Only  . Vicodin [Hydrocodone-Acetaminophen] Itching       Observations/Objective: Today's Vitals   09/22/19 1247  PainSc: 0-No pain   There is no  height or weight on file to calculate BMI.  Physical Exam  Constitutional: No distress.  Neurological: She is alert.  Psychiatric: Mood normal.    CBC    Component Value Date/Time   WBC 5.2 09/20/2019 1347   RBC 4.41 09/20/2019 1347   HGB 12.9 09/20/2019 1347   HGB 13.5 11/28/2017 1130   HCT 40.1 09/20/2019 1347   HCT 39.7 11/28/2017 1130   PLT 215 09/20/2019 1347   PLT 207 11/28/2017 1130   MCV 90.9 09/20/2019 1347   MCV 85 11/28/2017 1130   MCV 85 10/03/2014 1912   MCH 29.3 09/20/2019 1347   MCHC 32.2 09/20/2019 1347   RDW 13.6 09/20/2019 1347   RDW 13.8 11/28/2017 1130   RDW 13.7 10/03/2014 1912   LYMPHSABS 2.0 09/20/2019 1347   LYMPHSABS 1.5 11/28/2017 1130   MONOABS 0.3 09/20/2019 1347   EOSABS 0.1 09/20/2019 1347   EOSABS 0.1 11/28/2017 1130   BASOSABS 0.0 09/20/2019 1347   BASOSABS 0.0 11/28/2017 1130    CMP     Component Value Date/Time   NA 137 09/20/2019 1347   NA 138 02/06/2018 1444   NA 136 10/03/2014 1912   K 4.3 09/20/2019 1347   K 3.1 (L) 10/03/2014 1912   CL 105 09/20/2019 1347   CL 99 10/03/2014 1912   CO2 25 09/20/2019 1347   CO2 26 10/03/2014 1912   GLUCOSE 98 09/20/2019 1347   GLUCOSE 107 (H) 10/03/2014 1912   BUN 22 09/20/2019 1347   BUN 21 02/06/2018 1444   BUN 12 10/03/2014 1912   CREATININE 0.95 09/20/2019 1347   CREATININE 1.02 10/03/2014 1912   CALCIUM 9.2 09/20/2019 1347   CALCIUM 9.8  10/03/2014 1912   PROT 7.0 09/20/2019 1347   PROT 6.3 02/06/2018 1444   PROT 7.6 09/22/2012 1310   ALBUMIN 4.2 09/20/2019 1347   ALBUMIN 4.3 02/06/2018 1444   ALBUMIN 3.9 09/22/2012 1310   AST 17 09/20/2019 1347   AST 31 09/22/2012 1310   ALT 19 09/20/2019 1347   ALT 21 09/22/2012 1310   ALKPHOS 87 09/20/2019 1347   ALKPHOS 136 09/22/2012 1310   BILITOT 0.7 09/20/2019 1347   BILITOT 0.4 02/06/2018 1444   BILITOT 0.5 09/22/2012 1310   GFRNONAA >60 09/20/2019 1347   GFRNONAA 60 (L) 10/03/2014 1912   GFRNONAA >60 09/22/2012 1310   GFRAA >60 09/20/2019 1347   GFRAA >60 10/03/2014 1912   GFRAA >60 09/22/2012 1310    RADIOGRAPHIC STUDIES: I have personally reviewed the radiological images as listed and agreed with the findings in the report. CT Abdomen Pelvis Wo Contrast  Result Date: 09/20/2019 CLINICAL DATA:  Renal cell carcinoma. EXAM: CT CHEST, ABDOMEN AND PELVIS WITHOUT CONTRAST TECHNIQUE: Multidetector CT imaging of the chest, abdomen and pelvis was performed following the standard protocol without IV contrast. COMPARISON:  08/14/2018. FINDINGS: CT CHEST FINDINGS Cardiovascular: Atherosclerotic calcification of the aorta. Heart size normal. No pericardial effusion. Mediastinum/Nodes: 9 mm low-attenuation left thyroid nodule. No followup recommended (ref: J Am Coll Radiol. 2015 Feb;12(2): 143-50).No pathologically enlarged mediastinal or axillary lymph nodes. Hilar regions are difficult to evaluate without IV contrast but appear grossly unremarkable. Oral contrast is seen in the esophagus. Lungs/Pleura: Biapical pleuroparenchymal scarring. A few scattered tiny peripheral pulmonary nodules are unchanged and considered benign. Calcified granuloma in the right middle lobe. No pleural fluid. Airway is unremarkable. Musculoskeletal: Small sclerotic lesion in the T12 vertebral body is unchanged and likely benign. No worrisome lytic or sclerotic lesions. CT ABDOMEN  PELVIS FINDINGS Hepatobiliary:  Liver measures 21.1 cm. Low-attenuation lesion in the posterior right hepatic lobe measures 2.9 cm, similar and likely a cyst. Liver is otherwise unremarkable. Cholecystectomy. No biliary ductal dilatation. Pancreas: Negative. Spleen: Negative. Adrenals/Urinary Tract: Right adrenal gland is unremarkable. There may be slight thickening of the body of the left adrenal gland. Right kidney is unremarkable. Mild dilatation of the midportion of the right ureter, unchanged. Left nephrectomy. Bladder is grossly unremarkable. Stomach/Bowel: Stomach, small bowel, appendix and colon are unremarkable. Vascular/Lymphatic: Atherosclerotic calcification of the aorta without aneurysm. No pathologically enlarged lymph nodes. Reproductive: Uterus is visualized.  No adnexal mass. Other: No free fluid.  Mesenteries and peritoneum are unremarkable. Musculoskeletal: Degenerative changes in the spine. No worrisome lytic or sclerotic lesions. IMPRESSION: 1. No evidence of metastatic disease. 2. Hepatomegaly. 3.  Aortic atherosclerosis (ICD10-I70.0). Electronically Signed   By: Lorin Picket M.D.   On: 09/20/2019 14:16   CT Chest Wo Contrast  Result Date: 09/20/2019 CLINICAL DATA:  Renal cell carcinoma. EXAM: CT CHEST, ABDOMEN AND PELVIS WITHOUT CONTRAST TECHNIQUE: Multidetector CT imaging of the chest, abdomen and pelvis was performed following the standard protocol without IV contrast. COMPARISON:  08/14/2018. FINDINGS: CT CHEST FINDINGS Cardiovascular: Atherosclerotic calcification of the aorta. Heart size normal. No pericardial effusion. Mediastinum/Nodes: 9 mm low-attenuation left thyroid nodule. No followup recommended (ref: J Am Coll Radiol. 2015 Feb;12(2): 143-50).No pathologically enlarged mediastinal or axillary lymph nodes. Hilar regions are difficult to evaluate without IV contrast but appear grossly unremarkable. Oral contrast is seen in the esophagus. Lungs/Pleura: Biapical pleuroparenchymal scarring. A few scattered  tiny peripheral pulmonary nodules are unchanged and considered benign. Calcified granuloma in the right middle lobe. No pleural fluid. Airway is unremarkable. Musculoskeletal: Small sclerotic lesion in the T12 vertebral body is unchanged and likely benign. No worrisome lytic or sclerotic lesions. CT ABDOMEN PELVIS FINDINGS Hepatobiliary: Liver measures 21.1 cm. Low-attenuation lesion in the posterior right hepatic lobe measures 2.9 cm, similar and likely a cyst. Liver is otherwise unremarkable. Cholecystectomy. No biliary ductal dilatation. Pancreas: Negative. Spleen: Negative. Adrenals/Urinary Tract: Right adrenal gland is unremarkable. There may be slight thickening of the body of the left adrenal gland. Right kidney is unremarkable. Mild dilatation of the midportion of the right ureter, unchanged. Left nephrectomy. Bladder is grossly unremarkable. Stomach/Bowel: Stomach, small bowel, appendix and colon are unremarkable. Vascular/Lymphatic: Atherosclerotic calcification of the aorta without aneurysm. No pathologically enlarged lymph nodes. Reproductive: Uterus is visualized.  No adnexal mass. Other: No free fluid.  Mesenteries and peritoneum are unremarkable. Musculoskeletal: Degenerative changes in the spine. No worrisome lytic or sclerotic lesions. IMPRESSION: 1. No evidence of metastatic disease. 2. Hepatomegaly. 3.  Aortic atherosclerosis (ICD10-I70.0). Electronically Signed   By: Lorin Picket M.D.   On: 09/20/2019 14:16   MM 3D SCREEN BREAST BILATERAL  Result Date: 06/25/2019 CLINICAL DATA:  Screening. EXAM: DIGITAL SCREENING BILATERAL MAMMOGRAM WITH TOMO AND CAD COMPARISON:  Previous exam(s). ACR Breast Density Category b: There are scattered areas of fibroglandular density. FINDINGS: There are no findings suspicious for malignancy. Images were processed with CAD. IMPRESSION: No mammographic evidence of malignancy. A result letter of this screening mammogram will be mailed directly to the patient.  RECOMMENDATION: Screening mammogram in one year. (Code:SM-B-01Y) BI-RADS CATEGORY  1: Negative. Electronically Signed   By: Lillia Mountain M.D.   On: 06/25/2019 14:35     Assessment and Plan: 1. History of kidney cancer   2. Hepatomegaly     #History of RCC, Surveillance CT chest abdomen pelvis  was independently viewed by me and discussed with patient.  No evidence of metastatic disease. She has remained disease-free for 4 years since her surgery.  Continue annual CT chest abdomen pelvis till year 5  #Hepatomegaly, I called radiologyAnd talk to [place].  Retrospectively, CT also showed borderline hepatomegaly, 18 to 19 cm. Currently her liver measures 20 to 21 cm.  Etiology unknown.  Patient denies any constitutional symptoms. I will refer patient to Dr. Allen Norris for further evaluation.  I called patient and discussed with her after I talked to the radiologist.  She agrees with the plan.  Follow Up Instructions: 1 year after CT chest abdomen pelvis.   I discussed the assessment and treatment plan with the patient. The patient was provided an opportunity to ask questions and all were answered. The patient agreed with the plan and demonstrated an understanding of the instructions.  The patient was advised to call back or seek an in-person evaluation if the symptoms worsen or if the condition fails to improve as anticipated.    [name], MD 09/22/2019 12:54 PM

## 2019-09-24 ENCOUNTER — Telehealth: Payer: Self-pay | Admitting: Gastroenterology

## 2019-09-24 NOTE — Telephone Encounter (Signed)
Pt left vm for Ginger she would appreciate a call before she leaves today

## 2019-09-27 ENCOUNTER — Telehealth: Payer: Self-pay | Admitting: Gastroenterology

## 2019-09-27 NOTE — Telephone Encounter (Signed)
Pt returned my call regarding her upcoming appt with Dr. Allen Norris. Pt is very anxious and requested a sooner appt. Pt has been rescheduled to 10/08/19.

## 2019-09-27 NOTE — Telephone Encounter (Signed)
Patient called & would like to speak to Ginger about an up coming appointment

## 2019-09-27 NOTE — Telephone Encounter (Signed)
LVM for pt to return my call.

## 2019-10-08 ENCOUNTER — Ambulatory Visit (INDEPENDENT_AMBULATORY_CARE_PROVIDER_SITE_OTHER): Payer: 59 | Admitting: Gastroenterology

## 2019-10-08 ENCOUNTER — Other Ambulatory Visit: Payer: Self-pay

## 2019-10-08 ENCOUNTER — Encounter: Payer: Self-pay | Admitting: Gastroenterology

## 2019-10-08 VITALS — BP 136/85 | HR 68 | Temp 97.5°F | Ht 70.0 in | Wt 223.2 lb

## 2019-10-08 DIAGNOSIS — R160 Hepatomegaly, not elsewhere classified: Secondary | ICD-10-CM | POA: Diagnosis not present

## 2019-10-08 NOTE — Progress Notes (Signed)
Primary Care Physician: Einar Pheasant, MD  Primary Gastroenterologist:  Dr. Lucilla Lame  No chief complaint on file.   HPI: Jacqueline Nichols is a 64 y.o. female here for hepatomegaly.  This patient comes in today after being seen by her hematologist with the findings of hepatomegaly.  The patient had seen me in the past for irritable bowel syndrome with diarrhea and was being treated with Lomotil.  She has had FMLA papers filled out in the past due to her diarrhea.  The patient is seeing oncology due to renal cell carcinoma.  The patient had a CT scan of the abdomen on January 18 of this year that showed:  IMPRESSION: 1. No evidence of metastatic disease. 2. Hepatomegaly. 3.  Aortic atherosclerosis (ICD10-I70.0).  The patient's CBC and CMP on the same day were normal.  The patient reports that she had been seeing a cardiologist in the past for irregular heart rate.  She denies any history of liver disease.  She does drink moderately on the weekends but is not an everyday drinker.  The patient was found to have hepatomegaly with her previous CT scan showing the liver span being 18 to 19 cm with a subsequent exam showing between 20 and 21 cm.  There are this whole time the patient's liver enzymes have been normal.  The patient denies any abdominal pain in the right upper quadrant.   Past Medical History:  Diagnosis Date  . Cancer ([name])    skin  . Chicken pox   . Colon polyps   . Depression   . Diabetes mellitus ([name])   . GERD (gastroesophageal reflux disease)   . Hyperlipidemia   . Hypertension   . 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 Urinary incontinence     Current Outpatient Medications  Medication Sig Dispense Refill  . bisoprolol-hydrochlorothiazide (ZIAC) 2.5-6.25 MG tablet TAKE 1 TABLET BY MOUTH DAILY FOR HIGH BLOOD PRESSURE 90 tablet 1  . butalbital-acetaminophen-caffeine (FIORICET, ESGIC) 50-325-40 MG tablet TAKE ONE TABLET EACH DAY AS NEEDED FOR HEADACHE 15 tablet 0  .  citalopram (CELEXA) 40 MG tablet TAKE 1 TABLET BY MOUTH DAILY 90 tablet 1  . clonazePAM (KLONOPIN) 0.5 MG tablet TAKE 1 TABLET BY MOUTH EVERY DAY AS NEEDED 30 tablet 0  . clopidogrel (PLAVIX) 75 MG tablet TAKE 1 TABLET BY MOUTH EVERY DAY 90 tablet 1  . diphenoxylate-atropine (LOMOTIL) 2.5-0.025 MG tablet TAKE 1 TABLET BY MOUTH 4 TIMES DAILY AS NEEDED FOR DIARRHEA OR LOOSE STOOLS 120 tablet 5  . Loperamide HCl (IMODIUM PO) Take by mouth as needed.    . meloxicam (MOBIC) 7.5 MG tablet TAKE ONE TABLET TWICE DAILY 60 tablet 5  . Misc Natural Products (ALLERGY RELEAF SYSTEM PO) Take by mouth. Reported on 03/20/2016    . nystatin cream (MYCOSTATIN) Apply 1 application topically 2 (two) times daily. 30 g 0  . rosuvastatin (CRESTOR) 5 MG tablet TAKE 1 TABLET BY MOUTH DAILY 90 tablet 1  . torsemide (DEMADEX) 20 MG tablet TAKE 1 TABLET BY MOUTH EVERY DAY AS NEEDED. 90 tablet 1   No current facility-administered medications for this visit.    Allergies as of 10/08/2019 - Review Complete 09/22/2019  Allergen Reaction Noted  . Codeine Nausea Only 10/21/2012  . Vicodin [hydrocodone-acetaminophen] Itching 11/20/2012    ROS:  General: Negative for anorexia, weight loss, fever, chills, fatigue, weakness. ENT: Negative for hoarseness, difficulty swallowing , nasal congestion. CV: Negative for chest pain, angina, palpitations, dyspnea on exertion,  peripheral edema.  Respiratory: Negative for dyspnea at rest, dyspnea on exertion, cough, sputum, wheezing.  GI: See history of present illness. GU:  Negative for dysuria, hematuria, urinary incontinence, urinary frequency, nocturnal urination.  Endo: Negative for unusual weight change.    Physical Examination:   BP 136/85   Pulse 68   Temp (!) 97.5 F (36.4 C) (Temporal)   Ht 5\' 10"  (1.778 m)   Wt 223 lb 3.2 oz (101.2 kg)   BMI 32.03 kg/m   General: Well-nourished, well-developed in no acute distress.  Eyes: No icterus. Conjunctivae pink. Lungs:  Clear to auscultation bilaterally. Non-labored. Heart: Regular rate and rhythm, no murmurs rubs or gallops.  Abdomen: Bowel sounds are normal, nontender, nondistended, no hepatosplenomegaly or masses, no abdominal bruits or hernia , no rebound or guarding.   Extremities: +1 pitting edema. No clubbing or deformities. Neuro: Alert and oriented x 3.  Grossly intact. Skin: Warm and dry, no jaundice.   Psych: Alert and cooperative, normal mood and affect.  Labs:    Imaging Studies: CT Abdomen Pelvis Wo Contrast  Result Date: 09/20/2019 CLINICAL DATA:  Renal cell carcinoma. EXAM: CT CHEST, ABDOMEN AND PELVIS WITHOUT CONTRAST TECHNIQUE: Multidetector CT imaging of the chest, abdomen and pelvis was performed following the standard protocol without IV contrast. COMPARISON:  08/14/2018. FINDINGS: CT CHEST FINDINGS Cardiovascular: Atherosclerotic calcification of the aorta. Heart size normal. No pericardial effusion. Mediastinum/Nodes: 9 mm low-attenuation left thyroid nodule. No followup recommended (ref: J Am Coll Radiol. 2015 Feb;12(2): 143-50).No pathologically enlarged mediastinal or axillary lymph nodes. Hilar regions are difficult to evaluate without IV contrast but appear grossly unremarkable. Oral contrast is seen in the esophagus. Lungs/Pleura: Biapical pleuroparenchymal scarring. A few scattered tiny peripheral pulmonary nodules are unchanged and considered benign. Calcified granuloma in the right middle lobe. No pleural fluid. Airway is unremarkable. Musculoskeletal: Small sclerotic lesion in the T12 vertebral body is unchanged and likely benign. No worrisome lytic or sclerotic lesions. CT ABDOMEN PELVIS FINDINGS Hepatobiliary: Liver measures 21.1 cm. Low-attenuation lesion in the posterior right hepatic lobe measures 2.9 cm, similar and likely a cyst. Liver is otherwise unremarkable. Cholecystectomy. No biliary ductal dilatation. Pancreas: Negative. Spleen: Negative. Adrenals/Urinary Tract: Right  adrenal gland is unremarkable. There may be slight thickening of the body of the left adrenal gland. Right kidney is unremarkable. Mild dilatation of the midportion of the right ureter, unchanged. Left nephrectomy. Bladder is grossly unremarkable. Stomach/Bowel: Stomach, small bowel, appendix and colon are unremarkable. Vascular/Lymphatic: Atherosclerotic calcification of the aorta without aneurysm. No pathologically enlarged lymph nodes. Reproductive: Uterus is visualized.  No adnexal mass. Other: No free fluid.  Mesenteries and peritoneum are unremarkable. Musculoskeletal: Degenerative changes in the spine. No worrisome lytic or sclerotic lesions. IMPRESSION: 1. No evidence of metastatic disease. 2. Hepatomegaly. 3.  Aortic atherosclerosis (ICD10-I70.0). Electronically Signed   By: Lorin Picket M.D.   On: 09/20/2019 14:16   CT Chest Wo Contrast  Result Date: 09/20/2019 CLINICAL DATA:  Renal cell carcinoma. EXAM: CT CHEST, ABDOMEN AND PELVIS WITHOUT CONTRAST TECHNIQUE: Multidetector CT imaging of the chest, abdomen and pelvis was performed following the standard protocol without IV contrast. COMPARISON:  08/14/2018. FINDINGS: CT CHEST FINDINGS Cardiovascular: Atherosclerotic calcification of the aorta. Heart size normal. No pericardial effusion. Mediastinum/Nodes: 9 mm low-attenuation left thyroid nodule. No followup recommended (ref: J Am Coll Radiol. 2015 Feb;12(2): 143-50).No pathologically enlarged mediastinal or axillary lymph nodes. Hilar regions are difficult to evaluate without IV contrast but appear grossly unremarkable. Oral contrast is seen in the  esophagus. Lungs/Pleura: Biapical pleuroparenchymal scarring. A few scattered tiny peripheral pulmonary nodules are unchanged and considered benign. Calcified granuloma in the right middle lobe. No pleural fluid. Airway is unremarkable. Musculoskeletal: Small sclerotic lesion in the T12 vertebral body is unchanged and likely benign. No worrisome lytic or  sclerotic lesions. CT ABDOMEN PELVIS FINDINGS Hepatobiliary: Liver measures 21.1 cm. Low-attenuation lesion in the posterior right hepatic lobe measures 2.9 cm, similar and likely a cyst. Liver is otherwise unremarkable. Cholecystectomy. No biliary ductal dilatation. Pancreas: Negative. Spleen: Negative. Adrenals/Urinary Tract: Right adrenal gland is unremarkable. There may be slight thickening of the body of the left adrenal gland. Right kidney is unremarkable. Mild dilatation of the midportion of the right ureter, unchanged. Left nephrectomy. Bladder is grossly unremarkable. Stomach/Bowel: Stomach, small bowel, appendix and colon are unremarkable. Vascular/Lymphatic: Atherosclerotic calcification of the aorta without aneurysm. No pathologically enlarged lymph nodes. Reproductive: Uterus is visualized.  No adnexal mass. Other: No free fluid.  Mesenteries and peritoneum are unremarkable. Musculoskeletal: Degenerative changes in the spine. No worrisome lytic or sclerotic lesions. IMPRESSION: 1. No evidence of metastatic disease. 2. Hepatomegaly. 3.  Aortic atherosclerosis (ICD10-I70.0). Electronically Signed   By: Lorin Picket M.D.   On: 09/20/2019 14:16    Assessment and Plan:   Jacqueline Nichols is a 64 y.o. y/o female who comes in with an asymptomatic finding of hepatomegaly with a history of renal cell carcinoma.  The patient's differential diagnosis for hepatomegaly without abnormal liver enzymes, tenderness, weight loss or jaundice is small.  Hepatic congestion from a cardiac source is always a possibility especially with lower extremity edema.  Budd-Chiari can also cause hepatomegaly although the patient does not have any signs or symptoms.  The patient will have a Doppler ultrasound to rule this out but it would have likely been seen on the multiple CT scans that the patient has had.  Glycogen storage diseases are very uncommon to cause this patient's symptoms and PBC/PSC should have some abnormal liver  chemistries which are not present.  Although viral otitis can cause abnormal liver size the patient does not have any abnormal liver enzymes but will have her viral hepatitis checked.  Fatty liver can also cause hepatomegaly and the patient is obese with a sister who has fatty liver.  Alpha-1 antitrypsin ace deficiency can also cause hepatomegaly and this will also be checked in addition to ANA and SMA.  The patient should be considered for a cardiac work-up for possible right heart failure as the cause of her hepatomegaly.  The patient has been explained the plan and agrees with it.     Lucilla Lame, MD. Marval Regal    Note: This dictation was prepared with Dragon dictation along with smaller phrase technology. Any transcriptional errors that result from this process are unintentional.

## 2019-10-11 LAB — MITOCHONDRIAL ANTIBODIES: Mitochondrial Ab: <20 Units (ref 0.0–20.0)

## 2019-10-11 LAB — CERULOPLASMIN: Ceruloplasmin: 25.3 mg/dL (ref 19.0–39.0)

## 2019-10-11 LAB — HEPATITIS A ANTIBODY, TOTAL: hep A Total Ab: POSITIVE — AB

## 2019-10-11 LAB — ALPHA-1-ANTITRYPSIN: A-1 Antitrypsin: 121 mg/dL (ref 101–187)

## 2019-10-11 LAB — HEPATITIS B SURFACE ANTIGEN: Hepatitis B Surface Ag: NEGATIVE

## 2019-10-11 LAB — HEPATITIS C ANTIBODY: Hep C Virus Ab: <0.1 s/co ratio (ref 0.0–0.9)

## 2019-10-11 LAB — HEPATITIS B SURFACE ANTIBODY,QUALITATIVE: Hep B Surface Ab, Qual: NONREACTIVE

## 2019-10-11 LAB — ANTI-SMOOTH MUSCLE ANTIBODY, IGG: Smooth Muscle Ab: 4 Units (ref 0–19)

## 2019-10-11 LAB — ANA: Anti Nuclear Antibody (ANA): NEGATIVE

## 2019-10-15 ENCOUNTER — Other Ambulatory Visit: Payer: Self-pay

## 2019-10-15 ENCOUNTER — Ambulatory Visit
Admission: RE | Admit: 2019-10-15 | Discharge: 2019-10-15 | Disposition: A | Discharge status: Home / Self Care | Payer: 59 | Source: Ambulatory Visit | Attending: Gastroenterology | Admitting: Gastroenterology

## 2019-10-15 ENCOUNTER — Ambulatory Visit: Payer: 59 | Admitting: Gastroenterology

## 2019-10-15 DIAGNOSIS — R160 Hepatomegaly, not elsewhere classified: Secondary | ICD-10-CM

## 2019-10-18 ENCOUNTER — Telehealth: Payer: Self-pay

## 2019-10-18 NOTE — Telephone Encounter (Signed)
-----   Message from Lucilla Lame, MD sent at 10/16/2019  9:41 AM EST ----- Let the patient know that the blood flow through her liver was normal indicating that obstruction to the blood vessels is not causing her liver to be enlarged.

## 2019-10-18 NOTE — Telephone Encounter (Signed)
Pt notified of results via Mychart.  

## 2019-10-20 ENCOUNTER — Telehealth: Payer: Self-pay

## 2019-10-20 NOTE — Telephone Encounter (Signed)
-----   Message from Lucilla Lame, MD sent at 10/20/2019  1:45 PM EST ----- Regarding: RE: Margert Edsall-Let the patient know all of labs were normal and that the blood flow of her liver was normal. The swelling of her liver is not associated with any liver inflammation or infection. I have spoke to dr. Tasia Catchings about looking for other causes of her large liver. Something hematopoietic vs congestion. ----- Message ----- From: Glennie Isle, CMA Sent: 10/20/2019   1:36 PM EST To: Lucilla Lame, MD  Pt is calling about lab results. Please review and advise.

## 2019-10-20 NOTE — Telephone Encounter (Signed)
Pt notified of lab results

## 2019-12-11 ENCOUNTER — Other Ambulatory Visit: Payer: Self-pay | Admitting: Internal Medicine

## 2019-12-14 NOTE — Telephone Encounter (Signed)
rx ok'd for clonazepam #30 with no refills.   

## 2019-12-16 ENCOUNTER — Other Ambulatory Visit: Payer: Self-pay | Admitting: Gastroenterology

## 2019-12-17 ENCOUNTER — Other Ambulatory Visit: Payer: Self-pay

## 2019-12-17 MED ORDER — DIPHENOXYLATE-ATROPINE 2.5-0.025 MG PO TABS: ORAL_TABLET | ORAL | 5 refills | Status: DC

## 2019-12-24 ENCOUNTER — Encounter: Payer: 59 | Admitting: Internal Medicine

## 2020-01-07 ENCOUNTER — Encounter: Payer: Self-pay | Admitting: Internal Medicine

## 2020-01-07 ENCOUNTER — Other Ambulatory Visit: Payer: Self-pay

## 2020-01-07 ENCOUNTER — Ambulatory Visit (INDEPENDENT_AMBULATORY_CARE_PROVIDER_SITE_OTHER): Payer: 59 | Admitting: Internal Medicine

## 2020-01-07 VITALS — BP 122/70 | HR 90 | Temp 97.4°F | Resp 16 | Ht 70.0 in | Wt 210.0 lb

## 2020-01-07 DIAGNOSIS — N949 Unspecified condition associated with female genital organs and menstrual cycle: Secondary | ICD-10-CM

## 2020-01-07 DIAGNOSIS — D509 Iron deficiency anemia, unspecified: Secondary | ICD-10-CM

## 2020-01-07 DIAGNOSIS — Z85528 Personal history of other malignant neoplasm of kidney: Secondary | ICD-10-CM | POA: Diagnosis not present

## 2020-01-07 DIAGNOSIS — I10 Essential (primary) hypertension: Secondary | ICD-10-CM

## 2020-01-07 DIAGNOSIS — E559 Vitamin D deficiency, unspecified: Secondary | ICD-10-CM | POA: Diagnosis not present

## 2020-01-07 DIAGNOSIS — Z124 Encounter for screening for malignant neoplasm of cervix: Secondary | ICD-10-CM | POA: Diagnosis not present

## 2020-01-07 DIAGNOSIS — F329 Major depressive disorder, single episode, unspecified: Secondary | ICD-10-CM

## 2020-01-07 DIAGNOSIS — R21 Rash and other nonspecific skin eruption: Secondary | ICD-10-CM

## 2020-01-07 DIAGNOSIS — Z0000 Encounter for general adult medical examination without abnormal findings: Secondary | ICD-10-CM | POA: Diagnosis not present

## 2020-01-07 DIAGNOSIS — R109 Unspecified abdominal pain: Secondary | ICD-10-CM

## 2020-01-07 DIAGNOSIS — N760 Acute vaginitis: Secondary | ICD-10-CM

## 2020-01-07 DIAGNOSIS — E7800 Pure hypercholesterolemia, unspecified: Secondary | ICD-10-CM

## 2020-01-07 DIAGNOSIS — I471 Supraventricular tachycardia: Secondary | ICD-10-CM | POA: Diagnosis not present

## 2020-01-07 DIAGNOSIS — E119 Type 2 diabetes mellitus without complications: Secondary | ICD-10-CM

## 2020-01-07 DIAGNOSIS — K589 Irritable bowel syndrome without diarrhea: Secondary | ICD-10-CM

## 2020-01-07 DIAGNOSIS — F32A Depression, unspecified: Secondary | ICD-10-CM

## 2020-01-07 MED ORDER — NYSTATIN 100000 UNIT/GM EX POWD: 1.0000 "application " | Freq: Two times a day (BID) | CUTANEOUS | 0 refills | Status: DC

## 2020-01-07 NOTE — Assessment & Plan Note (Signed)
Physical today 01/07/20.  PAP 01/07/20.  Mammogram 06/25/19 -birads I.  Colonoscopy 2017

## 2020-01-07 NOTE — Progress Notes (Signed)
Patient ID: Jacqueline Nichols, female   DOB: [birth year], 64 y.o.   MRN: [id number]   Subjective:    Patient ID: Jacqueline Nichols, female    DOB: 22-Apr-1956, 64 y.o.   MRN: [id number]  HPI This visit occurred during the SARS-CoV-2 public health emergency.  Safety protocols were in place, including screening questions prior to the visit, additional usage of staff PPE, and extensive cleaning of exam room while observing appropriate contact time as indicated for disinfecting solutions.  Patient with past history of diabetes, hypercholesterolemia, hypertension and renal cell carcinoma.  She comes in today to follow up on these issues as well as for a complete physical exam.  She reports she is doing relatively well.  She has a history of renal cell carcinoma.  Sees oncology.  Recently referred to GI - Dr Allen Norris - hepatomegaly.  Labs - normal.  W/up unrevealing.  Pt denies any nausea or vomiting.  Reports no chest pain or tightness.  Some right side pain.  Appears to be positional.  No hurting today.  She did lift a large toaster oven.  Could have aggravated then.  Breathing stable.  No increased cough or congestion.  Bowels better.  Working from home.  Decreased stress.  In a new relationship.  Discussed using KY jelly, etc.  Has noticed some vaginal discharge.  No bloody discharge.  No vaginal itching.  No urinary symptoms.     Past Medical History:  Diagnosis Date  . Cancer ([place])    skin  . Chicken pox   . Colon polyps   . Depression   . Diabetes mellitus ([place])   . GERD (gastroesophageal reflux disease)   . Hyperlipidemia   . Hypertension   . 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 Urinary incontinence    Past Surgical History:  Procedure Laterality Date  . CHOLECYSTECTOMY  2001  . COLONOSCOPY WITH PROPOFOL N/A 03/27/2016   Procedure: COLONOSCOPY WITH PROPOFOL;  Surgeon: Lucilla Lame, MD;  Location: [facility] ENDOSCOPY;  Service: Endoscopy;  Laterality: N/A;  . KNEE SURGERY  2003-2009   s/p 8 knee surgeries (with  3 knee replacements)  . NEPHRECTOMY RADICAL  07/2015  . NISSEN FUNDOPLICATION     Family History  Problem Relation Age of Onset  . Colon cancer Mother        died - age 49  . Heart disease Father   . Hypertension Father   . Lymphoma Father   . Arthritis/Rheumatoid Sister   . Lung cancer Brother        smoked  . Arthritis/Rheumatoid Paternal Grandmother   . Cancer Maternal Uncle        Unknown type of cancer   . Cancer Paternal Aunt        Unknown type of cancer   . Throat cancer Paternal Uncle   . Colon cancer Paternal Uncle   . Breast cancer Neg Hx    Social History   Socioeconomic History  . Marital status: Divorced    Spouse name: Not on file  . Number of children: 2  . Years of education: Not on file  . Highest education level: Not on file  Occupational History  . Not on file  Tobacco Use  . Smoking status: Former Smoker    Packs/day: 1.00    Years: 15.00    Pack years: 15.00    Types: Cigarettes    Quit date: 10/23/2012    Years since quitting: 7.2  . Smokeless tobacco: Never Used  .  Tobacco comment: Quit Feb 2014  Substance and Sexual Activity  . Alcohol use: Yes    Alcohol/week: 1.0 standard drinks    Types: 1 Glasses of wine per week    Comment: social drinking - approx 2-4 monthly   . Drug use: No  . Sexual activity: Not Currently  Other Topics Concern  . Not on file  Social History Narrative  . Not on file   Social Determinants of Health   Financial Resource Strain:   . Difficulty of Paying Living Expenses:   Food Insecurity:   . Worried About Charity fundraiser in the Last Year:   . Arboriculturist in the Last Year:   Transportation Needs:   . Film/video editor (Medical):   Marland Kitchen Lack of Transportation (Non-Medical):   Physical Activity:   . Days of Exercise per Week:   . Minutes of Exercise per Session:   Stress:   . Feeling of Stress :   Social Connections:   . Frequency of Communication with Friends and Family:   . Frequency of  Social Gatherings with Friends and Family:   . Attends Religious Services:   . Active Member of Clubs or Organizations:   . Attends Archivist Meetings:   Marland Kitchen Marital Status:     Outpatient Encounter Medications as of 01/07/2020  Medication Sig  . bisoprolol-hydrochlorothiazide (ZIAC) 2.5-6.25 MG tablet TAKE 1 TABLET BY MOUTH DAILY FOR HIGH BLOOD PRESSURE  . butalbital-acetaminophen-caffeine (FIORICET, ESGIC) 50-325-40 MG tablet TAKE ONE TABLET EACH DAY AS NEEDED FOR HEADACHE  . citalopram (CELEXA) 40 MG tablet TAKE 1 TABLET BY MOUTH DAILY  . clonazePAM (KLONOPIN) 0.5 MG tablet TAKE 1 TABLET BY MOUTH EVERY DAY AS NEEDED  . clopidogrel (PLAVIX) 75 MG tablet TAKE 1 TABLET BY MOUTH EVERY DAY  . diphenoxylate-atropine (LOMOTIL) 2.5-0.025 MG tablet TAKE 1 TABLET BY MOUTH 4 TIMES DAILY AS NEEDED FOR DIARRHEA OR LOOSE STOOLS  . Loperamide HCl (IMODIUM PO) Take by mouth as needed.  . meloxicam (MOBIC) 7.5 MG tablet TAKE ONE TABLET TWICE DAILY  . Misc Natural Products (ALLERGY RELEAF SYSTEM PO) Take by mouth. Reported on 03/20/2016  . nystatin (MYCOSTATIN/NYSTOP) powder Apply 1 application topically 2 (two) times daily.  Marland Kitchen nystatin cream (MYCOSTATIN) Apply 1 application topically 2 (two) times daily.  . rosuvastatin (CRESTOR) 5 MG tablet TAKE 1 TABLET BY MOUTH DAILY  . torsemide (DEMADEX) 20 MG tablet TAKE 1 TABLET BY MOUTH EVERY DAY AS NEEDED.   No facility-administered encounter medications on file as of 01/07/2020.    Review of Systems  Constitutional: Negative for appetite change and unexpected weight change.  HENT: Negative for congestion and sinus pressure.   Eyes: Negative for pain and visual disturbance.  Respiratory: Negative for cough, chest tightness and shortness of breath.   Cardiovascular: Negative for chest pain, palpitations and leg swelling.  Gastrointestinal: Negative for abdominal pain, diarrhea, nausea and vomiting.       Bowels better.   Genitourinary: Positive for  vaginal discharge. Negative for difficulty urinating and dysuria.  Musculoskeletal: Negative for joint swelling and myalgias.  Skin: Negative for color change and rash.  Neurological: Negative for dizziness, light-headedness and headaches.  Hematological: Negative for adenopathy. Does not bruise/bleed easily.  Psychiatric/Behavioral: Negative for agitation and dysphoric mood.       Objective:    Physical Exam Vitals reviewed.  Constitutional:      General: She is not in acute distress.    Appearance: Normal appearance. She  is well-developed.  HENT:     Head: Normocephalic and atraumatic.     Right Ear: External ear normal.     Left Ear: External ear normal.  Eyes:     General: No scleral icterus.       Right eye: No discharge.        Left eye: No discharge.     Conjunctiva/sclera: Conjunctivae normal.  Neck:     Thyroid: No thyromegaly.  Cardiovascular:     Rate and Rhythm: Normal rate and regular rhythm.  Pulmonary:     Effort: No tachypnea, accessory muscle usage or respiratory distress.     Breath sounds: Normal breath sounds. No decreased breath sounds or wheezing.  Chest:     Breasts:        Right: No inverted nipple, mass, nipple discharge or tenderness (no axillary adenopathy).        Left: No inverted nipple, mass, nipple discharge or tenderness (no axilarry adenopathy).  Abdominal:     General: Bowel sounds are normal.     Palpations: Abdomen is soft.     Tenderness: There is no abdominal tenderness.  Genitourinary:    Comments: Normal external genitalia.  Vaginal vault without lesions.  Cervix identified.  Pap smear performed.  Could not appreciate any adnexal masses or tenderness.  KOH/wet prep obtained.   Musculoskeletal:        General: No swelling or tenderness.     Cervical back: Neck supple. No tenderness.  Lymphadenopathy:     Cervical: No cervical adenopathy.  Skin:    Findings: No erythema or rash.  Neurological:     Mental Status: She is alert and  oriented to person, place, and time.  Psychiatric:        Mood and Affect: Mood normal.        Behavior: Behavior normal.     BP 122/70   Pulse 90   Temp (!) 97.4 F (36.3 C)   Resp 16   Ht '5\' 10"'  (1.778 m)   Wt 210 lb (95.3 kg)   SpO2 99%   BMI 30.13 kg/m  Wt Readings from Last 3 Encounters:  01/07/20 210 lb (95.3 kg)  10/08/19 223 lb 3.2 oz (101.2 kg)  04/11/19 225 lb (102.1 kg)     Lab Results  Component Value Date   WBC 5.2 09/20/2019   HGB 12.9 09/20/2019   HCT 40.1 09/20/2019   PLT 215 09/20/2019   GLUCOSE 98 09/20/2019   CHOL 166 11/28/2017   TRIG 106 11/28/2017   HDL 53 11/28/2017   LDLCALC 92 11/28/2017   ALT 19 09/20/2019   AST 17 09/20/2019   NA 137 09/20/2019   K 4.3 09/20/2019   CL 105 09/20/2019   CREATININE 0.95 09/20/2019   BUN 22 09/20/2019   CO2 25 09/20/2019   TSH 1.370 11/28/2017   HGBA1C 5.5 11/28/2017    US LIVER DOPPLER  Result Date: 10/15/2019 CLINICAL DATA:  Hepatomegaly EXAM: DUPLEX ULTRASOUND OF LIVER TECHNIQUE: Color and duplex Doppler ultrasound was performed to evaluate the hepatic in-flow and out-flow vessels. COMPARISON:  09/20/2019 CT without contrast FINDINGS: Portal Vein Velocities Main:  34 cm/sec Right:  14 cm/sec Left:  12 cm/sec Hepatic Vein Velocities Right:  18 cm/sec Middle:  34 cm/sec Left:  15 cm/sec Hepatic Artery Velocity:  119 cm/sec Splenic Vein Velocity:  20 cm/sec Varices: Not visualized Ascites: None. Patent portal, hepatic and splenic veins with normal directional flow. Negative for portal vein occlusion or thrombus.  Splenic vein is also patent. No ascites. IMPRESSION: Normal hepatic venous Doppler. Electronically Signed   By: Jerilynn Mages.  Shick M.D.   On: 10/15/2019 16:23       Assessment & Plan:   Problem List Items Addressed This Visit    Anemia    Seeing oncology.  Following cbc.       Relevant Orders   Vitamin B12   Depression    Overall appears to be doing relatively well.  Continue celexa.  Follow.         Diabetes mellitus ([place])    Low carb diet and exercise.  Follow met b and a1c.        Relevant Orders   Hemoglobin A1c   Microalbumin / creatinine urine ratio   Health care maintenance    Physical today 01/07/20.  PAP 01/07/20.  Mammogram 06/25/19 -birads I.  Colonoscopy 2017       History of renal cell carcinoma    S/p left radical nephrectomy and partial adrenalectomy.  Seeing Dr Tasia Catchings.  Just had f/u CT as outlined.  Stable.        Hypercholesteremia    On crestor.  Low cholesterol diet and exercise.  Follow lipid panel and liver function tests.        Relevant Orders   Hepatic function panel   Lipid panel   Hypertension    Blood pressure doing well.  Continue ziac.  Follow pressures.  Follow metabolic panel.       Relevant Orders   TSH   Basic metabolic panel   IBS (irritable bowel syndrome)    Bowels symptoms improved.        Rash    Did report intermittent rash - appears to be c/w yeast.  Nystatin powder as directed.  Follow.        Right sided abdominal pain    Right side pain as outlined.  Appears to be more msk in origin.  No pain today.  Not reproducible on exam.  Did lift heavy toaster.  Follow.        SVT (supraventricular tachycardia) ([place])    Reports no increased heart rate or palpitations.  Continues on ziac.  Stable.  Follow.        Vaginal discomfort    Vaginal discomfort as outlined.  KY jelly as directed.  KOH/wet prep - obtained.  Follow.        Vitamin D deficiency    Recheck vitamin D level with next labs.        Relevant Orders   VITAMIN D 25 Hydroxy (Vit-D Deficiency, Fractures)    Other Visit Diagnoses    Routine general medical examination at a health care facility    -  Primary   Cervical cancer screening       Relevant Orders   Pap liquid-based and HPV (high risk)   Acute vaginitis       Relevant Orders   NuSwab VG, Candida 6sp (Completed)       Einar Pheasant, MD

## 2020-01-09 ENCOUNTER — Encounter: Payer: Self-pay | Admitting: Internal Medicine

## 2020-01-09 DIAGNOSIS — R109 Unspecified abdominal pain: Secondary | ICD-10-CM | POA: Insufficient documentation

## 2020-01-09 DIAGNOSIS — R21 Rash and other nonspecific skin eruption: Secondary | ICD-10-CM | POA: Insufficient documentation

## 2020-01-09 DIAGNOSIS — N949 Unspecified condition associated with female genital organs and menstrual cycle: Secondary | ICD-10-CM | POA: Insufficient documentation

## 2020-01-09 NOTE — Assessment & Plan Note (Signed)
Seeing oncology.  Following cbc.

## 2020-01-09 NOTE — Assessment & Plan Note (Signed)
Right side pain as outlined.  Appears to be more msk in origin.  No pain today.  Not reproducible on exam.  Did lift heavy toaster.  Follow.

## 2020-01-09 NOTE — Assessment & Plan Note (Signed)
Blood pressure doing well.  Continue ziac.  Follow pressures.  Follow metabolic panel.

## 2020-01-09 NOTE — Assessment & Plan Note (Signed)
Recheck vitamin D level with next labs.  

## 2020-01-09 NOTE — Assessment & Plan Note (Signed)
Vaginal discomfort as outlined.  KY jelly as directed.  KOH/wet prep - obtained.  Follow.

## 2020-01-09 NOTE — Assessment & Plan Note (Signed)
S/p left radical nephrectomy and partial adrenalectomy.  Seeing Dr Tasia Catchings.  Just had f/u CT as outlined.  Stable.

## 2020-01-09 NOTE — Assessment & Plan Note (Signed)
On crestor.  Low cholesterol diet and exercise.  Follow lipid panel and liver function tests.   

## 2020-01-09 NOTE — Assessment & Plan Note (Signed)
Overall appears to be doing relatively well.  Continue celexa.  Follow.

## 2020-01-09 NOTE — Assessment & Plan Note (Signed)
Did report intermittent rash - appears to be c/w yeast.  Nystatin powder as directed.  Follow.

## 2020-01-09 NOTE — Assessment & Plan Note (Signed)
Bowels symptoms improved.

## 2020-01-09 NOTE — Assessment & Plan Note (Signed)
Low carb diet and exercise.  Follow met b and a1c.   

## 2020-01-09 NOTE — Assessment & Plan Note (Signed)
Reports no increased heart rate or palpitations.  Continues on ziac.  Stable.  Follow.

## 2020-01-10 ENCOUNTER — Encounter: Payer: Self-pay | Admitting: Internal Medicine

## 2020-01-10 LAB — NUSWAB VG, CANDIDA 6SP
C PARAPSILOSIS/TROPICALIS: NEGATIVE
Candida albicans, NAA: NEGATIVE
Candida glabrata, NAA: NEGATIVE
Candida krusei, NAA: NEGATIVE
Candida lusitaniae, NAA: NEGATIVE
Trich vag by NAA: NEGATIVE

## 2020-01-14 LAB — IGP, APTIMA HPV: HPV Aptima: NEGATIVE

## 2020-01-14 LAB — SPECIMEN STATUS REPORT

## 2020-01-15 ENCOUNTER — Encounter: Payer: Self-pay | Admitting: Internal Medicine

## 2020-01-17 ENCOUNTER — Telehealth: Payer: Self-pay | Admitting: Internal Medicine

## 2020-01-17 NOTE — Telephone Encounter (Signed)
Patient was calling because she forgot to mention that she has been having issues with bladder incontinence. Especially when she laughs, coughs, sneezes, etc. Was wondering what is recommended or if she should be referred to a specialist. No other acute symptoms. Pt stated she does not need a call back this PM. She just forgot to mention it at her recent visit

## 2020-01-17 NOTE — Telephone Encounter (Signed)
Pt would like a call back about a concern that she has but did not want to disclose what it is. Please advise.

## 2020-01-18 NOTE — Telephone Encounter (Signed)
If persistent problems, would probably benefit from a referral to PT for pelvic floor therapy.  Let me know if agreeable.

## 2020-01-18 NOTE — Telephone Encounter (Signed)
Patient aware and declined pelvic floor exercises at this time. Will let me know if she changes her mind

## 2020-01-27 ENCOUNTER — Other Ambulatory Visit: Payer: Self-pay | Admitting: Internal Medicine

## 2020-01-27 NOTE — Telephone Encounter (Signed)
Refill request for klonopin, last seen 01-07-20, last filled .  Please advise. 12-14-19

## 2020-01-27 NOTE — Telephone Encounter (Signed)
rx ok'd for clonazepam #30 with one refill.   

## 2020-01-28 ENCOUNTER — Other Ambulatory Visit: Payer: Self-pay | Admitting: Internal Medicine

## 2020-01-29 LAB — BASIC METABOLIC PANEL
BUN/Creatinine Ratio: 15 (ref 12–28)
BUN: 13 mg/dL (ref 8–27)
CO2: 24 mmol/L (ref 20–29)
Calcium: 9.4 mg/dL (ref 8.7–10.3)
Chloride: 104 mmol/L (ref 96–106)
Creatinine, Ser: 0.84 mg/dL (ref 0.57–1.00)
GFR calc Af Amer: 86 mL/min/{1.73_m2} (ref >59)
GFR calc non Af Amer: 75 mL/min/{1.73_m2} (ref >59)
Glucose: 84 mg/dL (ref 65–99)
Potassium: 4.6 mmol/L (ref 3.5–5.2)
Sodium: 141 mmol/L (ref 134–144)

## 2020-01-29 LAB — HEPATIC FUNCTION PANEL
ALT: 14 IU/L (ref 0–32)
AST: 16 IU/L (ref 0–40)
Albumin: 4.5 g/dL (ref 3.8–4.8)
Alkaline Phosphatase: 101 IU/L (ref 48–121)
Bilirubin Total: 0.3 mg/dL (ref 0.0–1.2)
Bilirubin, Direct: 0.09 mg/dL (ref 0.00–0.40)
Total Protein: 6.3 g/dL (ref 6.0–8.5)

## 2020-01-29 LAB — MICROALBUMIN / CREATININE URINE RATIO
Creatinine, Urine: 49 mg/dL
Microalb/Creat Ratio: 10 mg/g creat (ref 0–29)
Microalbumin, Urine: 4.7 ug/mL

## 2020-01-29 LAB — LIPID PANEL
Chol/HDL Ratio: 3.5 ratio (ref 0.0–4.4)
Cholesterol, Total: 185 mg/dL (ref 100–199)
HDL: 53 mg/dL (ref >39)
LDL Chol Calc (NIH): 110 mg/dL — ABNORMAL HIGH (ref 0–99)
Triglycerides: 124 mg/dL (ref 0–149)
VLDL Cholesterol Cal: 22 mg/dL (ref 5–40)

## 2020-01-29 LAB — VITAMIN B12: Vitamin B-12: 273 pg/mL (ref 232–1245)

## 2020-01-29 LAB — VITAMIN D 25 HYDROXY (VIT D DEFICIENCY, FRACTURES): Vit D, 25-Hydroxy: 13.6 ng/mL — ABNORMAL LOW (ref 30.0–100.0)

## 2020-01-29 LAB — TSH: TSH: 2.39 u[IU]/mL (ref 0.450–4.500)

## 2020-01-29 LAB — HEMOGLOBIN A1C
Est. average glucose Bld gHb Est-mCnc: 108 mg/dL
Hgb A1c MFr Bld: 5.4 % (ref 4.8–5.6)

## 2020-02-04 ENCOUNTER — Other Ambulatory Visit: Payer: Self-pay

## 2020-02-04 MED ORDER — VITAMIN D (ERGOCALCIFEROL) 1.25 MG (50000 UNIT) PO CAPS: 50000.0000 [IU] | ORAL_CAPSULE | ORAL | 3 refills | Status: DC

## 2020-02-04 MED ORDER — "SYRINGE/NEEDLE (DISP) 25G X 1"" 3 ML MISC": 0 refills | Status: DC

## 2020-02-04 MED ORDER — CYANOCOBALAMIN 1000 MCG/ML IJ SOLN: INTRAMUSCULAR | 0 refills | Status: DC

## 2020-02-08 ENCOUNTER — Telehealth: Payer: Self-pay | Admitting: Internal Medicine

## 2020-02-08 NOTE — Telephone Encounter (Signed)
Patient stated she fainted Sunday evening. She was out at the pool all day Sunday drinking wine, she got home was making dinner, cleaning, playing cards. She got nauseas and decided to go to restroom, she fainted. After episode she was flushed and did cool compress and sipped water. She has been fine since.Patient has been drinking a lot of water and sx has not happened again. Patient feels she doesn't have to go to UC/ED. If PCP is persistent about going, she hates to spend the money but she will go. Please advise,

## 2020-02-08 NOTE — Telephone Encounter (Signed)
Please triage her. Confirm nothing acute going on. If she passed out, Dr Nicki Reaper is going to want her to go to acute care for evaluation today.

## 2020-02-08 NOTE — Telephone Encounter (Signed)
Patient passed out over the weekend. Thinks she was dehydrated, urine was a little orange. Same thing happened last year. Patient was offered an appointment with Jerelene Redden. Patient wanted to see what Dr. Nicki Reaper thought.

## 2020-02-09 NOTE — Telephone Encounter (Signed)
Please let me know about meeting tomorrow so I can schedule patient accordingly. Thanks for your help!

## 2020-02-09 NOTE — Telephone Encounter (Signed)
No provider meeting.

## 2020-02-09 NOTE — Telephone Encounter (Signed)
Please call and confirm pt still doing ok.  In reviewing, it appears she was out in the sun, drinking wine, etc.  Possible dehydration, vasovagal episode, etc.  Any acute symptoms, needs to be evaluated.  Confirm no seizure, chest pain, etc.  I can see her Friday at 12:00, but if any acute symptoms needs to be seen prior.

## 2020-02-10 NOTE — Telephone Encounter (Signed)
Called patient. Confirmed doing ok. Confirmed no chest pain or seizure. Patient stated that she was sitting in the floor when she passed out and she felt it coming on so she laid back. She did not fall. Advised patient that she should still be evaluated and offered her an appt for tomorrow.  Patient declined evaluation and stated that she would let us know if she needs anything.

## 2020-04-10 ENCOUNTER — Ambulatory Visit: Payer: 59 | Admitting: Internal Medicine

## 2020-04-28 ENCOUNTER — Ambulatory Visit: Payer: 59 | Admitting: Internal Medicine

## 2020-04-28 ENCOUNTER — Other Ambulatory Visit: Payer: Self-pay

## 2020-04-28 DIAGNOSIS — E559 Vitamin D deficiency, unspecified: Secondary | ICD-10-CM | POA: Diagnosis not present

## 2020-04-28 DIAGNOSIS — I471 Supraventricular tachycardia: Secondary | ICD-10-CM | POA: Diagnosis not present

## 2020-04-28 DIAGNOSIS — E1165 Type 2 diabetes mellitus with hyperglycemia: Secondary | ICD-10-CM

## 2020-04-28 DIAGNOSIS — K589 Irritable bowel syndrome without diarrhea: Secondary | ICD-10-CM | POA: Diagnosis not present

## 2020-04-28 DIAGNOSIS — F32A Depression, unspecified: Secondary | ICD-10-CM

## 2020-04-28 DIAGNOSIS — Z85528 Personal history of other malignant neoplasm of kidney: Secondary | ICD-10-CM

## 2020-04-28 DIAGNOSIS — D509 Iron deficiency anemia, unspecified: Secondary | ICD-10-CM

## 2020-04-28 DIAGNOSIS — E538 Deficiency of other specified B group vitamins: Secondary | ICD-10-CM

## 2020-04-28 DIAGNOSIS — I10 Essential (primary) hypertension: Secondary | ICD-10-CM

## 2020-04-28 DIAGNOSIS — F329 Major depressive disorder, single episode, unspecified: Secondary | ICD-10-CM

## 2020-04-28 DIAGNOSIS — E7800 Pure hypercholesterolemia, unspecified: Secondary | ICD-10-CM

## 2020-04-28 NOTE — Patient Instructions (Signed)
Vitamin D3 1000 units - take one per day.

## 2020-04-28 NOTE — Progress Notes (Signed)
Patient ID: JOHANNA MATTO, female   DOB: 08-04-1956, 64 y.o.   MRN: [id number]   Subjective:    Patient ID: LUCINDA SPELLS, female    DOB: 1956/07/23, 64 y.o.   MRN: [id number]  HPI This visit occurred during the SARS-CoV-2 public health emergency.  Safety protocols were in place, including screening questions prior to the visit, additional usage of staff PPE, and extensive cleaning of exam room while observing appropriate contact time as indicated for disinfecting solutions.  Patient here for a scheduled follow up.  She reports she is doing relatively well.  Working from home.  Sister - moving.  Some increased stress.  Overall she feels she is handling things relatively well.  Has been on citalopram.  Tries to stay active.  No chest pain or sob reported.  No abdominal pain reported.  Blood pressure ok.    Past Medical History:  Diagnosis Date  . Cancer ([place])    skin  . Chicken pox   . Colon polyps   . Depression   . Diabetes mellitus ([place])   . GERD (gastroesophageal reflux disease)   . Hyperlipidemia   . Hypertension   . 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 Urinary incontinence    Past Surgical History:  Procedure Laterality Date  . CHOLECYSTECTOMY  2001  . COLONOSCOPY WITH PROPOFOL N/A 03/27/2016   Procedure: COLONOSCOPY WITH PROPOFOL;  Surgeon: Lucilla Lame, MD;  Location: [facility] ENDOSCOPY;  Service: Endoscopy;  Laterality: N/A;  . KNEE SURGERY  2003-2009   s/p 8 knee surgeries (with 3 knee replacements)  . NEPHRECTOMY RADICAL  07/2015  . NISSEN FUNDOPLICATION     Family History  Problem Relation Age of Onset  . Colon cancer Mother        died - age 49  . Heart disease Father   . Hypertension Father   . Lymphoma Father   . Arthritis/Rheumatoid Sister   . Lung cancer Brother        smoked  . Arthritis/Rheumatoid Paternal Grandmother   . Cancer Maternal Uncle        Unknown type of cancer   . Cancer Paternal Aunt        Unknown type of cancer   . Throat cancer Paternal Uncle     . Colon cancer Paternal Uncle   . Breast cancer Neg Hx    Social History   Socioeconomic History  . Marital status: Divorced    Spouse name: Not on file  . Number of children: 2  . Years of education: Not on file  . Highest education level: Not on file  Occupational History  . Not on file  Tobacco Use  . Smoking status: Former Smoker    Packs/day: 1.00    Years: 15.00    Pack years: 15.00    Types: Cigarettes    Quit date: 10/23/2012    Years since quitting: 7.5  . Smokeless tobacco: Never Used  . Tobacco comment: Quit Feb 2014  Vaping Use  . Vaping Use: Former  . Start date: 02/20/2013  . Quit date: 02/20/2014  Substance and Sexual Activity  . Alcohol use: Yes    Alcohol/week: 1.0 standard drink    Types: 1 Glasses of wine per week    Comment: social drinking - approx 2-4 monthly   . Drug use: No  . Sexual activity: Not Currently  Other Topics Concern  . Not on file  Social History Narrative  . Not on file  Social Determinants of Health   Financial Resource Strain:   . Difficulty of Paying Living Expenses: Not on file  Food Insecurity:   . Worried About Charity fundraiser in the Last Year: Not on file  . Ran Out of Food in the Last Year: Not on file  Transportation Needs:   . Lack of Transportation (Medical): Not on file  . Lack of Transportation (Non-Medical): Not on file  Physical Activity:   . Days of Exercise per Week: Not on file  . Minutes of Exercise per Session: Not on file  Stress:   . Feeling of Stress : Not on file  Social Connections:   . Frequency of Communication with Friends and Family: Not on file  . Frequency of Social Gatherings with Friends and Family: Not on file  . Attends Religious Services: Not on file  . Active Member of Clubs or Organizations: Not on file  . Attends Archivist Meetings: Not on file  . Marital Status: Not on file    Outpatient Encounter Medications as of 04/28/2020  Medication Sig  .  bisoprolol-hydrochlorothiazide (ZIAC) 2.5-6.25 MG tablet TAKE 1 TABLET BY MOUTH DAILY FOR HIGH BLOOD PRESSURE  . butalbital-acetaminophen-caffeine (FIORICET, ESGIC) 50-325-40 MG tablet TAKE ONE TABLET EACH DAY AS NEEDED FOR HEADACHE  . citalopram (CELEXA) 40 MG tablet TAKE 1 TABLET BY MOUTH DAILY  . clonazePAM (KLONOPIN) 0.5 MG tablet TAKE 1 TABLET BY MOUTH EVERY DAY AS NEEDED  . clopidogrel (PLAVIX) 75 MG tablet TAKE 1 TABLET BY MOUTH EVERY DAY  . cyanocobalamin (,VITAMIN B-12,) 1000 MCG/ML injection Inject into the muscle once a week for 4 weeks and then every 30 days.  . diphenoxylate-atropine (LOMOTIL) 2.5-0.025 MG tablet TAKE 1 TABLET BY MOUTH 4 TIMES DAILY AS NEEDED FOR DIARRHEA OR LOOSE STOOLS  . Loperamide HCl (IMODIUM PO) Take by mouth as needed.  . meloxicam (MOBIC) 7.5 MG tablet TAKE ONE TABLET TWICE DAILY  . Misc Natural Products (ALLERGY RELEAF SYSTEM PO) Take by mouth. Reported on 03/20/2016  . nystatin (MYCOSTATIN/NYSTOP) powder Apply 1 application topically 2 (two) times daily.  Marland Kitchen nystatin cream (MYCOSTATIN) Apply 1 application topically 2 (two) times daily.  . rosuvastatin (CRESTOR) 5 MG tablet TAKE 1 TABLET BY MOUTH DAILY  . SYRINGE-NEEDLE, DISP, 3 ML 25G X 1" 3 ML MISC Use as directed with b12 injections  . torsemide (DEMADEX) 20 MG tablet TAKE 1 TABLET BY MOUTH EVERY DAY AS NEEDED.  . Vitamin D, Ergocalciferol, (DRISDOL) 1.25 MG (50000 UNIT) CAPS capsule Take 1 capsule (50,000 Units total) by mouth every 7 (seven) days.   No facility-administered encounter medications on file as of 04/28/2020.    Review of Systems  Constitutional: Negative for appetite change and unexpected weight change.  HENT: Negative for congestion and sinus pressure.   Respiratory: Negative for cough, chest tightness and shortness of breath.   Cardiovascular: Negative for chest pain, palpitations and leg swelling.  Gastrointestinal: Negative for abdominal pain, diarrhea, nausea and vomiting.    Genitourinary: Negative for difficulty urinating and dysuria.  Musculoskeletal: Negative for joint swelling and myalgias.  Skin: Negative for color change and rash.  Neurological: Negative for dizziness, light-headedness and headaches.  Psychiatric/Behavioral: Negative for agitation and dysphoric mood.       Objective:    Physical Exam Vitals reviewed.  Constitutional:      General: She is not in acute distress.    Appearance: Normal appearance.  HENT:     Head: Normocephalic and atraumatic.  Right Ear: External ear normal.     Left Ear: External ear normal.  Eyes:     General: No scleral icterus.       Right eye: No discharge.        Left eye: No discharge.     Conjunctiva/sclera: Conjunctivae normal.  Neck:     Thyroid: No thyromegaly.  Cardiovascular:     Rate and Rhythm: Normal rate and regular rhythm.  Pulmonary:     Effort: No respiratory distress.     Breath sounds: Normal breath sounds. No wheezing.  Abdominal:     General: Bowel sounds are normal.     Palpations: Abdomen is soft.     Tenderness: There is no abdominal tenderness.  Musculoskeletal:        General: No swelling or tenderness.     Cervical back: Neck supple. No tenderness.  Lymphadenopathy:     Cervical: No cervical adenopathy.  Skin:    Findings: No erythema or rash.  Neurological:     Mental Status: She is alert.  Psychiatric:        Mood and Affect: Mood normal.        Behavior: Behavior normal.     BP 118/70   Pulse 74   Temp 97.9 F (36.6 C)   Resp 16   Ht $R'5\' 10"'fK$  (1.778 m)   Wt 215 lb 9.6 oz (97.8 kg)   SpO2 97%   BMI 30.94 kg/m  Wt Readings from Last 3 Encounters:  04/28/20 215 lb 9.6 oz (97.8 kg)  01/07/20 210 lb (95.3 kg)  10/08/19 223 lb 3.2 oz (101.2 kg)     Lab Results  Component Value Date   WBC 5.2 09/20/2019   HGB 12.9 09/20/2019   HCT 40.1 09/20/2019   PLT 215 09/20/2019   GLUCOSE 84 01/28/2020   CHOL 185 01/28/2020   TRIG 124 01/28/2020   HDL 53  01/28/2020   LDLCALC 110 (H) 01/28/2020   ALT 14 01/28/2020   AST 16 01/28/2020   NA 141 01/28/2020   K 4.6 01/28/2020   CL 104 01/28/2020   CREATININE 0.84 01/28/2020   BUN 13 01/28/2020   CO2 24 01/28/2020   TSH 2.390 01/28/2020   HGBA1C 5.4 01/28/2020    US LIVER DOPPLER  Result Date: 10/15/2019 CLINICAL DATA:  Hepatomegaly EXAM: DUPLEX ULTRASOUND OF LIVER TECHNIQUE: Color and duplex Doppler ultrasound was performed to evaluate the hepatic in-flow and out-flow vessels. COMPARISON:  09/20/2019 CT without contrast FINDINGS: Portal Vein Velocities Main:  34 cm/sec Right:  14 cm/sec Left:  12 cm/sec Hepatic Vein Velocities Right:  18 cm/sec Middle:  34 cm/sec Left:  15 cm/sec Hepatic Artery Velocity:  119 cm/sec Splenic Vein Velocity:  20 cm/sec Varices: Not visualized Ascites: None. Patent portal, hepatic and splenic veins with normal directional flow. Negative for portal vein occlusion or thrombus. Splenic vein is also patent. No ascites. IMPRESSION: Normal hepatic venous Doppler. Electronically Signed   By: Jerilynn Mages.  Shick M.D.   On: 10/15/2019 16:23       Assessment & Plan:   Problem List Items Addressed This Visit    Vitamin D deficiency    Vitamin D supplements.  Follow.        SVT (supraventricular tachycardia) (HCC)    Continues on ziac.  Stable.  Follow.       Personal history of renal cell carcinoma    S/p left radical nephrectomy and partial adrenalectomy.  Seeing Dr Tasia Catchings.  Continue f/u with  oncology and renal function.       IBS (irritable bowel syndrome)    Symptoms stable.        Hypertension    Blood pressure as outlined.  On ziac and torsemide.  Follow pressures.  Follow metabolic panel.       Hypercholesteremia    On crestor.  Low cholesterol diet and exercise.  Follow lipid panel and liver function tests.        Relevant Orders   Hepatic function panel   Lipid panel   Diabetes mellitus ([place])    Low carb diet and exercise.  Follow met b and a1c.         Relevant Orders   Hemoglobin J6R   Basic metabolic panel   Depression    Continue celexa.  Overall appears to be handling things relatively well.  Follow.        B12 deficiency    Continue b12 supplements.        Anemia    Follow cbc.           Einar Pheasant, MD

## 2020-05-01 ENCOUNTER — Telehealth: Payer: Self-pay | Admitting: Internal Medicine

## 2020-05-01 NOTE — Telephone Encounter (Signed)
Pt was calling to ask about Vitamin D and B12 being called into the pharmacy after her appt on Friday. Please advise

## 2020-05-01 NOTE — Telephone Encounter (Signed)
Vit D is OTC and Vit b12 is at the pharmacy

## 2020-05-07 ENCOUNTER — Encounter: Payer: Self-pay | Admitting: Internal Medicine

## 2020-05-07 NOTE — Assessment & Plan Note (Signed)
Continue b12 supplements.  

## 2020-05-07 NOTE — Assessment & Plan Note (Signed)
S/p left radical nephrectomy and partial adrenalectomy.  Seeing Dr Tasia Catchings.  Continue f/u with oncology and renal function.

## 2020-05-07 NOTE — Assessment & Plan Note (Signed)
Follow cbc.  

## 2020-05-07 NOTE — Assessment & Plan Note (Signed)
Blood pressure as outlined.  On ziac and torsemide.  Follow pressures.  Follow metabolic panel.

## 2020-05-07 NOTE — Assessment & Plan Note (Signed)
Low carb diet and exercise.  Follow met b and a1c.   

## 2020-05-07 NOTE — Assessment & Plan Note (Signed)
On crestor.  Low cholesterol diet and exercise.  Follow lipid panel and liver function tests.   

## 2020-05-07 NOTE — Assessment & Plan Note (Signed)
Continue celexa.  Overall appears to be handling things relatively well.  Follow.

## 2020-05-07 NOTE — Assessment & Plan Note (Signed)
Symptoms stable

## 2020-05-07 NOTE — Assessment & Plan Note (Signed)
Continues on ziac.  Stable.  Follow.

## 2020-05-07 NOTE — Assessment & Plan Note (Signed)
Vitamin D supplements.  Follow.

## 2020-06-08 ENCOUNTER — Other Ambulatory Visit: Payer: Self-pay | Admitting: Internal Medicine

## 2020-06-08 DIAGNOSIS — Z1231 Encounter for screening mammogram for malignant neoplasm of breast: Secondary | ICD-10-CM

## 2020-06-27 ENCOUNTER — Other Ambulatory Visit: Payer: Self-pay | Admitting: Internal Medicine

## 2020-06-28 NOTE — Telephone Encounter (Signed)
rx ok'd for clonazepam #30 with one refill.   

## 2020-07-04 ENCOUNTER — Telehealth: Payer: Self-pay | Admitting: Internal Medicine

## 2020-07-04 NOTE — Telephone Encounter (Signed)
Patient called in about her lab results 

## 2020-07-04 NOTE — Telephone Encounter (Signed)
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 Needs fasting labs

## 2020-07-07 ENCOUNTER — Ambulatory Visit
Admission: RE | Admit: 2020-07-07 | Discharge: 2020-07-07 | Disposition: A | Discharge status: Home / Self Care | Payer: 59 | Source: Ambulatory Visit | Attending: Internal Medicine | Admitting: Internal Medicine

## 2020-07-07 ENCOUNTER — Telehealth: Payer: Self-pay

## 2020-07-07 ENCOUNTER — Other Ambulatory Visit: Payer: Self-pay

## 2020-07-07 DIAGNOSIS — Z1231 Encounter for screening mammogram for malignant neoplasm of breast: Secondary | ICD-10-CM | POA: Insufficient documentation

## 2020-07-07 DIAGNOSIS — E119 Type 2 diabetes mellitus without complications: Secondary | ICD-10-CM

## 2020-07-07 DIAGNOSIS — E7800 Pure hypercholesterolemia, unspecified: Secondary | ICD-10-CM

## 2020-07-07 DIAGNOSIS — I10 Essential (primary) hypertension: Secondary | ICD-10-CM

## 2020-07-07 NOTE — Telephone Encounter (Signed)
Labs have been reordered to [facility] per request.

## 2020-07-08 ENCOUNTER — Other Ambulatory Visit: Payer: Self-pay | Admitting: Internal Medicine

## 2020-07-08 DIAGNOSIS — E875 Hyperkalemia: Secondary | ICD-10-CM

## 2020-07-08 LAB — BASIC METABOLIC PANEL
BUN/Creatinine Ratio: 19 (ref 12–28)
BUN: 18 mg/dL (ref 8–27)
CO2: 24 mmol/L (ref 20–29)
Calcium: 9.2 mg/dL (ref 8.7–10.3)
Chloride: 108 mmol/L — ABNORMAL HIGH (ref 96–106)
Creatinine, Ser: 0.93 mg/dL (ref 0.57–1.00)
GFR calc Af Amer: 76 mL/min/{1.73_m2} (ref >59)
GFR calc non Af Amer: 66 mL/min/{1.73_m2} (ref >59)
Glucose: 92 mg/dL (ref 65–99)
Potassium: 5.3 mmol/L — ABNORMAL HIGH (ref 3.5–5.2)
Sodium: 143 mmol/L (ref 134–144)

## 2020-07-08 LAB — HEMOGLOBIN A1C
Est. average glucose Bld gHb Est-mCnc: 103 mg/dL
Hgb A1c MFr Bld: 5.2 % (ref 4.8–5.6)

## 2020-07-08 LAB — HEPATIC FUNCTION PANEL
ALT: 22 IU/L (ref 0–32)
AST: 19 IU/L (ref 0–40)
Albumin: 4.1 g/dL (ref 3.8–4.8)
Alkaline Phosphatase: 95 IU/L (ref 44–121)
Bilirubin Total: 0.3 mg/dL (ref 0.0–1.2)
Bilirubin, Direct: 0.12 mg/dL (ref 0.00–0.40)
Total Protein: 5.9 g/dL — ABNORMAL LOW (ref 6.0–8.5)

## 2020-07-08 LAB — LIPID PANEL
Chol/HDL Ratio: 3 ratio (ref 0.0–4.4)
Cholesterol, Total: 138 mg/dL (ref 100–199)
HDL: 46 mg/dL (ref >39)
LDL Chol Calc (NIH): 77 mg/dL (ref 0–99)
Triglycerides: 79 mg/dL (ref 0–149)
VLDL Cholesterol Cal: 15 mg/dL (ref 5–40)

## 2020-07-08 NOTE — Progress Notes (Signed)
Order placed for f/u potassium.  

## 2020-07-12 ENCOUNTER — Other Ambulatory Visit
Admission: RE | Admit: 2020-07-12 | Discharge: 2020-07-12 | Disposition: A | Discharge status: Home / Self Care | Payer: 59 | Attending: Internal Medicine | Admitting: Internal Medicine

## 2020-07-12 DIAGNOSIS — E875 Hyperkalemia: Secondary | ICD-10-CM

## 2020-07-12 LAB — POTASSIUM: Potassium: 4.4 mmol/L (ref 3.5–5.1)

## 2020-07-18 ENCOUNTER — Telehealth: Payer: Self-pay | Admitting: Internal Medicine

## 2020-07-18 NOTE — Telephone Encounter (Signed)
Patient called in stated that she went to the walk in clinic for back pain doctor gave her some medication can't take them while she work wanted a call back to discuss other kind of medication with Dr.Scott.

## 2020-07-19 NOTE — Telephone Encounter (Signed)
Patient was just calling to let us know that she had went to the walk in because she could not see in my chart. Patient was given naproxen and metaxalone for pulled muscle in her back. She is unable to take the muscle relaxer and work. Advised that drowsiness is a common side effect of any muscle relaxer. Patient is going to use the naproxen and topical pain relief during the day and muscle relaxers at night if needed. Advised her to call if symptoms do not continue to improve.

## 2020-08-04 ENCOUNTER — Other Ambulatory Visit: Payer: Self-pay

## 2020-08-04 ENCOUNTER — Ambulatory Visit: Payer: 59 | Admitting: Internal Medicine

## 2020-08-04 DIAGNOSIS — E559 Vitamin D deficiency, unspecified: Secondary | ICD-10-CM | POA: Diagnosis not present

## 2020-08-04 DIAGNOSIS — E1165 Type 2 diabetes mellitus with hyperglycemia: Secondary | ICD-10-CM | POA: Diagnosis not present

## 2020-08-04 DIAGNOSIS — Z85528 Personal history of other malignant neoplasm of kidney: Secondary | ICD-10-CM

## 2020-08-04 DIAGNOSIS — I10 Essential (primary) hypertension: Secondary | ICD-10-CM | POA: Diagnosis not present

## 2020-08-04 DIAGNOSIS — I471 Supraventricular tachycardia: Secondary | ICD-10-CM | POA: Diagnosis not present

## 2020-08-04 DIAGNOSIS — E7800 Pure hypercholesterolemia, unspecified: Secondary | ICD-10-CM

## 2020-08-04 DIAGNOSIS — Z8719 Personal history of other diseases of the digestive system: Secondary | ICD-10-CM

## 2020-08-04 DIAGNOSIS — D509 Iron deficiency anemia, unspecified: Secondary | ICD-10-CM

## 2020-08-04 DIAGNOSIS — F32A Depression, unspecified: Secondary | ICD-10-CM

## 2020-08-04 LAB — HM DIABETES FOOT EXAM

## 2020-08-04 MED ORDER — HYDROXYZINE HCL 10 MG PO TABS: 10.0000 mg | ORAL_TABLET | Freq: Every evening | ORAL | 1 refills | Status: DC | PRN

## 2020-08-04 NOTE — Progress Notes (Signed)
Patient ID: Jacqueline Nichols, female   DOB: 04/25/1956, 64 y.o.   MRN: [id number]   Subjective:    Patient ID: Jacqueline Nichols, female    DOB: 14-May-1956, 64 y.o.   MRN: [id number]  HPI This visit occurred during the SARS-CoV-2 public health emergency.  Safety protocols were in place, including screening questions prior to the visit, additional usage of staff PPE, and extensive cleaning of exam room while observing appropriate contact time as indicated for disinfecting solutions.  Patient here for a scheduled follow up.  Here to follow up regarding her blood sugar and blood pressure.  She is doing relatively well.  Was evaluated in acute care for right side thoracic back pain.  Heating pad helps.  No chest pain or sob reported.  No cough or congestion.  No acid reflux reported.  No abdominal pain or bowel change reported.  Taking clonazepam. Discussed my desires to get her off clonazepam. She is willing to taper off.  Discussed a trial of hydroxyzine.    Past Medical History:  Diagnosis Date  . Cancer ([place])    skin  . Chicken pox   . Colon polyps   . Depression   . Diabetes mellitus ([place])   . GERD (gastroesophageal reflux disease)   . Hyperlipidemia   . Hypertension   . 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 Urinary incontinence    Past Surgical History:  Procedure Laterality Date  . CHOLECYSTECTOMY  2001  . COLONOSCOPY WITH PROPOFOL N/A 03/27/2016   Procedure: COLONOSCOPY WITH PROPOFOL;  Surgeon: Lucilla Lame, MD;  Location: [facility] ENDOSCOPY;  Service: Endoscopy;  Laterality: N/A;  . KNEE SURGERY  2003-2009   s/p 8 knee surgeries (with 3 knee replacements)  . NEPHRECTOMY RADICAL  07/2015  . NISSEN FUNDOPLICATION     Family History  Problem Relation Age of Onset  . Colon cancer Mother        died - age 40  . Heart disease Father   . Hypertension Father   . Lymphoma Father   . Arthritis/Rheumatoid Sister   . Lung cancer Brother        smoked  . Arthritis/Rheumatoid Paternal Grandmother   .  Cancer Maternal Uncle        Unknown type of cancer   . Cancer Paternal Aunt        Unknown type of cancer   . Throat cancer Paternal Uncle   . Colon cancer Paternal Uncle   . Breast cancer Neg Hx    Social History   Socioeconomic History  . Marital status: Divorced    Spouse name: Not on file  . Number of children: 2  . Years of education: Not on file  . Highest education level: Not on file  Occupational History  . Not on file  Tobacco Use  . Smoking status: Former Smoker    Packs/day: 1.00    Years: 15.00    Pack years: 15.00    Types: Cigarettes    Quit date: 10/23/2012    Years since quitting: 7.7  . Smokeless tobacco: Never Used  . Tobacco comment: Quit Feb 2014  Vaping Use  . Vaping Use: Former  . Start date: 02/20/2013  . Quit date: 02/20/2014  Substance and Sexual Activity  . Alcohol use: Yes    Alcohol/week: 1.0 standard drink    Types: 1 Glasses of wine per week    Comment: social drinking - approx 2-4 monthly   . Drug use: No  .  Sexual activity: Not Currently  Other Topics Concern  . Not on file  Social History Narrative  . Not on file   Social Determinants of Health   Financial Resource Strain:   . Difficulty of Paying Living Expenses: Not on file  Food Insecurity:   . Worried About Charity fundraiser in the Last Year: Not on file  . Ran Out of Food in the Last Year: Not on file  Transportation Needs:   . Lack of Transportation (Medical): Not on file  . Lack of Transportation (Non-Medical): Not on file  Physical Activity:   . Days of Exercise per Week: Not on file  . Minutes of Exercise per Session: Not on file  Stress:   . Feeling of Stress : Not on file  Social Connections:   . Frequency of Communication with Friends and Family: Not on file  . Frequency of Social Gatherings with Friends and Family: Not on file  . Attends Religious Services: Not on file  . Active Member of Clubs or Organizations: Not on file  . Attends Archivist  Meetings: Not on file  . Marital Status: Not on file    Outpatient Encounter Medications as of 08/04/2020  Medication Sig  . bisoprolol-hydrochlorothiazide (ZIAC) 2.5-6.25 MG tablet TAKE 1 TABLET BY MOUTH DAILY FOR HIGH BLOOD PRESSURE  . butalbital-acetaminophen-caffeine (FIORICET, ESGIC) 50-325-40 MG tablet TAKE ONE TABLET EACH DAY AS NEEDED FOR HEADACHE  . citalopram (CELEXA) 40 MG tablet TAKE 1 TABLET BY MOUTH DAILY  . clonazePAM (KLONOPIN) 0.5 MG tablet TAKE 1 TABLET BY MOUTH EVERY DAY AS NEEDED  . clopidogrel (PLAVIX) 75 MG tablet TAKE 1 TABLET BY MOUTH EVERY DAY  . cyanocobalamin (,VITAMIN B-12,) 1000 MCG/ML injection INJECT INTO THE MUSCLE ONCE A WEEK FOR 4WEEKS AND THEN EVERY 30 DAYS  . diphenoxylate-atropine (LOMOTIL) 2.5-0.025 MG tablet TAKE 1 TABLET BY MOUTH 4 TIMES DAILY AS NEEDED FOR DIARRHEA OR LOOSE STOOLS  . hydrOXYzine (ATARAX/VISTARIL) 10 MG tablet Take 1 tablet (10 mg total) by mouth at bedtime as needed.  . Loperamide HCl (IMODIUM PO) Take by mouth as needed.  . meloxicam (MOBIC) 7.5 MG tablet TAKE ONE TABLET TWICE DAILY  . Misc Natural Products (ALLERGY RELEAF SYSTEM PO) Take by mouth. Reported on 03/20/2016  . nystatin (MYCOSTATIN/NYSTOP) powder Apply 1 application topically 2 (two) times daily.  Marland Kitchen nystatin cream (MYCOSTATIN) Apply 1 application topically 2 (two) times daily.  . rosuvastatin (CRESTOR) 5 MG tablet TAKE 1 TABLET BY MOUTH DAILY  . SYRINGE-NEEDLE, DISP, 3 ML 25G X 1" 3 ML MISC Use as directed with b12 injections  . torsemide (DEMADEX) 20 MG tablet TAKE 1 TABLET BY MOUTH EVERY DAY AS NEEDED.  . Vitamin D, Ergocalciferol, (DRISDOL) 1.25 MG (50000 UNIT) CAPS capsule Take 1 capsule (50,000 Units total) by mouth every 7 (seven) days.   No facility-administered encounter medications on file as of 08/04/2020.    Review of Systems  Constitutional: Negative for appetite change and unexpected weight change.  HENT: Negative for congestion and sinus pressure.     Respiratory: Negative for cough, chest tightness and shortness of breath.   Cardiovascular: Negative for chest pain, palpitations and leg swelling.  Gastrointestinal: Negative for abdominal pain, diarrhea, nausea and vomiting.  Genitourinary: Negative for difficulty urinating and dysuria.  Musculoskeletal: Positive for back pain. Negative for joint swelling and myalgias.  Skin: Negative for color change and rash.  Neurological: Negative for dizziness, light-headedness and headaches.  Psychiatric/Behavioral: Negative for agitation and dysphoric  mood.       Objective:    Physical Exam Vitals reviewed.  Constitutional:      General: She is not in acute distress.    Appearance: Normal appearance.  HENT:     Head: Normocephalic and atraumatic.     Right Ear: External ear normal.     Left Ear: External ear normal.  Eyes:     General: No scleral icterus.       Right eye: No discharge.        Left eye: No discharge.     Conjunctiva/sclera: Conjunctivae normal.  Neck:     Thyroid: No thyromegaly.  Cardiovascular:     Rate and Rhythm: Normal rate and regular rhythm.  Pulmonary:     Effort: No respiratory distress.     Breath sounds: Normal breath sounds. No wheezing.  Abdominal:     General: Bowel sounds are normal.     Palpations: Abdomen is soft.     Tenderness: There is no abdominal tenderness.  Musculoskeletal:        General: No swelling or tenderness.     Cervical back: Neck supple. No tenderness.  Lymphadenopathy:     Cervical: No cervical adenopathy.  Skin:    Findings: No erythema or rash.  Neurological:     Mental Status: She is alert.  Psychiatric:        Mood and Affect: Mood normal.        Behavior: Behavior normal.     BP 130/78   Pulse 63   Temp 98.2 F (36.8 C) (Oral)   Resp 16   Ht _0  (1.778 m)   Wt 223 lb (101.2 kg)   SpO2 98%   BMI 32.00 kg/m  Wt Readings from Last 3 Encounters:  08/04/20 223 lb (101.2 kg)  04/28/20 215 lb 9.6 oz (97.8  kg)  01/07/20 210 lb (95.3 kg)     Lab Results  Component Value Date   WBC 5.2 09/20/2019   HGB 12.9 09/20/2019   HCT 40.1 09/20/2019   PLT 215 09/20/2019   GLUCOSE 92 07/07/2020   CHOL 138 07/07/2020   TRIG 79 07/07/2020   HDL 46 07/07/2020   LDLCALC 77 07/07/2020   ALT 22 07/07/2020   AST 19 07/07/2020   NA 143 07/07/2020   K 4.4 07/12/2020   CL 108 (H) 07/07/2020   CREATININE 0.93 07/07/2020   BUN 18 07/07/2020   CO2 24 07/07/2020   TSH 2.390 01/28/2020   HGBA1C 5.2 07/07/2020       Assessment & Plan:   Problem List Items Addressed This Visit    Vitamin D deficiency    Follow vitamin D level.        Relevant Orders   VITAMIN D 25 Hydroxy (Vit-D Deficiency, Fractures)   SVT (supraventricular tachycardia) (HCC)    Stable.  On ziac.  Follow.       Personal history of renal cell carcinoma    S/p left radical nephrectomy and partial adrenalectomy.  Seeing Dr Tasia Catchings.  Continue f/u with oncology and follow renal function.       Hypertension    Continue on ziac and torsemide.  Blood pressure doing well.  Follow pressures.  Follow metabolic panel.       Relevant Orders   CBC with Differential/Platelet   Hypercholesteremia    On crestor.  Low cholesterol diet and exercise.  Follow lipid panel and liver function tests.        Relevant  Orders   Hepatic function panel   Lipid panel   History of IBS    Bowels improved.  Follow.       Diabetes mellitus ([place])    Low carb diet and exercise.  Follow met b and a1c.       Relevant Orders   Hemoglobin Y1H   Basic metabolic panel   Depression    Continue celexa.  Doing well on this medication.  On clonazepam.  Will taper clonazepam.  Start hydroxyzine.  Tapering instructions given.  Follow.        Relevant Medications   hydrOXYzine (ATARAX/VISTARIL) 10 MG tablet   Anemia    Follow cbc.           Einar Pheasant, MD

## 2020-08-05 ENCOUNTER — Encounter: Payer: Self-pay | Admitting: Internal Medicine

## 2020-08-05 NOTE — Assessment & Plan Note (Signed)
Continue celexa.  Doing well on this medication.  On clonazepam.  Will taper clonazepam.  Start hydroxyzine.  Tapering instructions given.  Follow.

## 2020-08-05 NOTE — Assessment & Plan Note (Signed)
Follow cbc.  

## 2020-08-05 NOTE — Assessment & Plan Note (Signed)
S/p left radical nephrectomy and partial adrenalectomy.  Seeing Dr Tasia Catchings.  Continue f/u with oncology and follow renal function.

## 2020-08-05 NOTE — Assessment & Plan Note (Signed)
Stable.  On ziac.  Follow.  

## 2020-08-05 NOTE — Assessment & Plan Note (Signed)
Continue on ziac and torsemide.  Blood pressure doing well.  Follow pressures.  Follow metabolic panel.  

## 2020-08-05 NOTE — Assessment & Plan Note (Signed)
Low carb diet and exercise.  Follow met b and a1c.  

## 2020-08-05 NOTE — Assessment & Plan Note (Signed)
On crestor.  Low cholesterol diet and exercise.  Follow lipid panel and liver function tests.   

## 2020-08-05 NOTE — Assessment & Plan Note (Signed)
Follow vitamin D level.  

## 2020-08-05 NOTE — Assessment & Plan Note (Signed)
Bowels improved.  Follow.

## 2020-08-16 ENCOUNTER — Telehealth: Payer: Self-pay

## 2020-08-16 MED ORDER — HYDROXYZINE HCL 10 MG PO TABS: ORAL_TABLET | ORAL | 1 refills | Status: DC

## 2020-08-16 NOTE — Telephone Encounter (Signed)
Pt said when she saw Dr. Nicki Reaper on 12/3 she gave her a new medication hydroxyzine to help her sleep. She said said taking one pill doesn't help so she has been taking two and she thinks this works better. She is wanting to know if Dr. Nicki Reaper will increase her dosage on the medication or prescribe her something else?

## 2020-08-16 NOTE — Telephone Encounter (Signed)
Are you okay with her doing this? She has increased herself to two per night.

## 2020-08-16 NOTE — Telephone Encounter (Signed)
I sent in rx for hydroxyzine - two tablets q hs prn.

## 2020-08-17 NOTE — Telephone Encounter (Signed)
Mychart sent.

## 2020-09-21 ENCOUNTER — Ambulatory Visit
Admission: RE | Admit: 2020-09-21 | Discharge: 2020-09-21 | Disposition: A | Discharge status: Home / Self Care | Payer: 59 | Source: Ambulatory Visit | Attending: Oncology | Admitting: Oncology

## 2020-09-21 ENCOUNTER — Other Ambulatory Visit: Payer: Self-pay

## 2020-09-21 DIAGNOSIS — Z85528 Personal history of other malignant neoplasm of kidney: Secondary | ICD-10-CM | POA: Insufficient documentation

## 2020-09-22 ENCOUNTER — Other Ambulatory Visit: Payer: Self-pay

## 2020-09-22 ENCOUNTER — Ambulatory Visit: Payer: 59

## 2020-09-22 DIAGNOSIS — Z85528 Personal history of other malignant neoplasm of kidney: Secondary | ICD-10-CM

## 2020-09-25 ENCOUNTER — Encounter: Payer: Self-pay | Admitting: Oncology

## 2020-09-25 ENCOUNTER — Inpatient Hospital Stay: Payer: 59 | Admitting: Oncology

## 2020-09-25 ENCOUNTER — Inpatient Hospital Stay: Payer: 59 | Attending: Oncology

## 2020-09-25 VITALS — BP 116/73 | HR 55 | Temp 97.3°F | Resp 18 | Wt 229.8 lb

## 2020-09-25 DIAGNOSIS — Z85528 Personal history of other malignant neoplasm of kidney: Secondary | ICD-10-CM | POA: Diagnosis not present

## 2020-09-25 LAB — COMPREHENSIVE METABOLIC PANEL
ALT: 24 U/L (ref 0–44)
AST: 27 U/L (ref 15–41)
Albumin: 4.1 g/dL (ref 3.5–5.0)
Alkaline Phosphatase: 81 U/L (ref 38–126)
Anion gap: 7 (ref 5–15)
BUN: 27 mg/dL — ABNORMAL HIGH (ref 8–23)
CO2: 27 mmol/L (ref 22–32)
Calcium: 9 mg/dL (ref 8.9–10.3)
Chloride: 103 mmol/L (ref 98–111)
Creatinine, Ser: 0.96 mg/dL (ref 0.44–1.00)
GFR, Estimated: >60 mL/min (ref >60)
Glucose, Bld: 94 mg/dL (ref 70–99)
Potassium: 4.4 mmol/L (ref 3.5–5.1)
Sodium: 137 mmol/L (ref 135–145)
Total Bilirubin: 0.6 mg/dL (ref 0.3–1.2)
Total Protein: 6.5 g/dL (ref 6.5–8.1)

## 2020-09-25 LAB — CBC WITH DIFFERENTIAL/PLATELET
Abs Immature Granulocytes: 0.02 10*3/uL (ref 0.00–0.07)
Basophils Absolute: 0 10*3/uL (ref 0.0–0.1)
Basophils Relative: 0 %
Eosinophils Absolute: 0.2 10*3/uL (ref 0.0–0.5)
Eosinophils Relative: 4 %
HCT: 38.2 % (ref 36.0–46.0)
Hemoglobin: 13 g/dL (ref 12.0–15.0)
Immature Granulocytes: 0 %
Lymphocytes Relative: 26 %
Lymphs Abs: 1.4 10*3/uL (ref 0.7–4.0)
MCH: 30.5 pg (ref 26.0–34.0)
MCHC: 34 g/dL (ref 30.0–36.0)
MCV: 89.7 fL (ref 80.0–100.0)
Monocytes Absolute: 0.4 10*3/uL (ref 0.1–1.0)
Monocytes Relative: 7 %
Neutro Abs: 3.5 10*3/uL (ref 1.7–7.7)
Neutrophils Relative %: 63 %
Platelets: 188 10*3/uL (ref 150–400)
RBC: 4.26 MIL/uL (ref 3.87–5.11)
RDW: 12.2 % (ref 11.5–15.5)
WBC: 5.4 10*3/uL (ref 4.0–10.5)
nRBC: 0 % (ref 0.0–0.2)

## 2020-09-25 LAB — LACTATE DEHYDROGENASE: LDH: 131 U/L (ref 98–192)

## 2020-09-25 NOTE — Progress Notes (Signed)
Hematology/Oncology follow up note [facility] Telephone:([phone] Fax:([phone]) [phone]   Patient Care Team: Einar Pheasant, MD as PCP - General (Internal Medicine)  REFERRING PROVIDER: Einar Pheasant, MD REASON FOR VISIT Follow up for treatment of renal cell carcinoma  HISTORY OF PRESENTING ILLNESS:  Jacqueline Nichols is a  65 y.o.  female with PMH listed below who was referred to me for evaluation of history of renal cell carcinoma Patient follows up with [facility] urology and want to switch her care to local. Extensive medical chart review was performed by me. History of Stage II left renal cell carcinoma.  In 2016, she was found to have a left renal mass 7.9 cm x 2.6 cm x 9.3 cm.  Underwent laparoscopic left radical nephrectomy with Dr.Matthew Reyna on 07/18/2015.  Per Note in care everywhere, patient has clear cell renal cell carcinoma, ISU P nuclear grade 2, pT2a, margins of resections are negative for tumor, no metastatic carcinoma identified in 1 hilar lymph nodes (0/1), background kidney with scattered sclerotic glomeruli; histologically unremarkable adrenal gland.  Patient is currently 2 and 1/2 years after surgery.  She has been having surveillance ultrasound every 6 months.  Today patient denies any cough, weight loss, hematuria, flank pain. Former smoker  INTERVAL HISTORY Jacqueline Nichols is a 65 y.o. female who has above history reviewed by me today presents for follow up visit for management of history of renal cell carcinoma  Patient reports doing well.  No new complaints. She has had surveillance CT scan done during interval and presents to discuss results.   Review of Systems  Constitutional: Negative for chills, fever, malaise/fatigue and weight loss.  HENT: Negative for sore throat.   Eyes: Negative for redness.  Respiratory: Negative for cough, shortness of breath and wheezing.   Cardiovascular: Negative for chest pain, palpitations and leg  swelling.  Gastrointestinal: Negative for abdominal pain, blood in stool, nausea and vomiting.  Genitourinary: Negative for dysuria.  Musculoskeletal: Negative for myalgias.  Skin: Negative for rash.  Neurological: Negative for dizziness, tingling and tremors.  Endo/Heme/Allergies: Does not bruise/bleed easily.  Psychiatric/Behavioral: Negative for hallucinations.    MEDICAL HISTORY:  Past Medical History:  Diagnosis Date  . Cancer ([place])    skin  . Chicken pox   . Colon polyps   . Depression   . Diabetes mellitus ([place])   . GERD (gastroesophageal reflux disease)   . Hyperlipidemia   . Hypertension   . 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 Urinary incontinence     SURGICAL HISTORY: Past Surgical History:  Procedure Laterality Date  . CHOLECYSTECTOMY  2001  . COLONOSCOPY WITH PROPOFOL N/A 03/27/2016   Procedure: COLONOSCOPY WITH PROPOFOL;  Surgeon: Lucilla Lame, MD;  Location: [facility] ENDOSCOPY;  Service: Endoscopy;  Laterality: N/A;  . KNEE SURGERY  2003-2009   s/p 8 knee surgeries (with 3 knee replacements)  . NEPHRECTOMY RADICAL  07/2015  . NISSEN FUNDOPLICATION      SOCIAL HISTORY: Social History   Socioeconomic History  . Marital status: Divorced    Spouse name: Not on file  . Number of children: 2  . Years of education: Not on file  . Highest education level: Not on file  Occupational History  . Not on file  Tobacco Use  . Smoking status: Former Smoker    Packs/day: 1.00    Years: 15.00    Pack years: 15.00    Types: Cigarettes    Quit date: 10/23/2012  Years since quitting: 7.9  . Smokeless tobacco: Never Used  . Tobacco comment: Quit Feb 2014  Vaping Use  . Vaping Use: Former  . Start date: 02/20/2013  . Quit date: 02/20/2014  Substance and Sexual Activity  . Alcohol use: Yes    Alcohol/week: 1.0 standard drink    Types: 1 Glasses of wine per week    Comment: social drinking - approx 2-4 monthly   . Drug use: No  . Sexual activity: Not Currently  Other  Topics Concern  . Not on file  Social History Narrative  . Not on file   Social Determinants of Health   Financial Resource Strain: Not on file  Food Insecurity: Not on file  Transportation Needs: Not on file  Physical Activity: Not on file  Stress: Not on file  Social Connections: Not on file  Intimate Partner Violence: Not on file    FAMILY HISTORY: Family History  Problem Relation Age of Onset  . Colon cancer Mother        died - age 82  . Heart disease Father   . Hypertension Father   . Lymphoma Father   . Arthritis/Rheumatoid Sister   . Lung cancer Brother        smoked  . Arthritis/Rheumatoid Paternal Grandmother   . Cancer Maternal Uncle        Unknown type of cancer   . Cancer Paternal Aunt        Unknown type of cancer   . Throat cancer Paternal Uncle   . Colon cancer Paternal Uncle   . Breast cancer Neg Hx     ALLERGIES:  is allergic to codeine and vicodin [hydrocodone-acetaminophen].  MEDICATIONS:  Current Outpatient Medications  Medication Sig Dispense Refill  . bisoprolol-hydrochlorothiazide (ZIAC) 2.5-6.25 MG tablet TAKE 1 TABLET BY MOUTH DAILY FOR HIGH BLOOD PRESSURE 90 tablet 1  . butalbital-acetaminophen-caffeine (FIORICET, ESGIC) 50-325-40 MG tablet TAKE ONE TABLET EACH DAY AS NEEDED FOR HEADACHE 15 tablet 0  . citalopram (CELEXA) 40 MG tablet TAKE 1 TABLET BY MOUTH DAILY 90 tablet 1  . clonazePAM (KLONOPIN) 0.5 MG tablet TAKE 1 TABLET BY MOUTH EVERY DAY AS NEEDED 30 tablet 1  . clopidogrel (PLAVIX) 75 MG tablet TAKE 1 TABLET BY MOUTH EVERY DAY 90 tablet 1  . cyanocobalamin (,VITAMIN B-12,) 1000 MCG/ML injection INJECT INTO THE MUSCLE ONCE A WEEK FOR 4WEEKS AND THEN EVERY 30 DAYS 10 mL 0  . diphenoxylate-atropine (LOMOTIL) 2.5-0.025 MG tablet TAKE 1 TABLET BY MOUTH 4 TIMES DAILY AS NEEDED FOR DIARRHEA OR LOOSE STOOLS 120 tablet 5  . hydrOXYzine (ATARAX/VISTARIL) 10 MG tablet Take 2 tablets q hs prn 60 tablet 1  . Loperamide HCl (IMODIUM PO) Take by  mouth as needed.    . meloxicam (MOBIC) 7.5 MG tablet TAKE ONE TABLET TWICE DAILY 60 tablet 5  . Misc Natural Products (ALLERGY RELEAF SYSTEM PO) Take by mouth. Reported on 03/20/2016    . nystatin (MYCOSTATIN/NYSTOP) powder Apply 1 application topically 2 (two) times daily. 30 g 0  . nystatin cream (MYCOSTATIN) Apply 1 application topically 2 (two) times daily. 30 g 0  . rosuvastatin (CRESTOR) 5 MG tablet TAKE 1 TABLET BY MOUTH DAILY 90 tablet 1  . SYRINGE-NEEDLE, DISP, 3 ML 25G X 1" 3 ML MISC Use as directed with b12 injections 50 each 0  . torsemide (DEMADEX) 20 MG tablet TAKE 1 TABLET BY MOUTH EVERY DAY AS NEEDED. 90 tablet 1  . Vitamin D, Ergocalciferol, (DRISDOL) 1.25 MG (  50000 UNIT) CAPS capsule Take 1 capsule (50,000 Units total) by mouth every 7 (seven) days. 5 capsule 3   No current facility-administered medications for this visit.     PHYSICAL EXAMINATION: ECOG PERFORMANCE STATUS: 0 - Asymptomatic Vitals:   09/25/20 0957  BP: 116/73  Pulse: (!) 55  Resp: 18  Temp: (!) 97.3 F (36.3 C)   Filed Weights   09/25/20 0957  Weight: 229 lb 12.8 oz (104.2 kg)    Physical Exam Constitutional:      General: She is not in acute distress. HENT:     Head: Normocephalic and atraumatic.  Eyes:     General: No scleral icterus.    Conjunctiva/sclera: Conjunctivae normal.     Pupils: Pupils are equal, round, and reactive to light.  Cardiovascular:     Rate and Rhythm: Normal rate and regular rhythm.     Heart sounds: Normal heart sounds.  Pulmonary:     Effort: Pulmonary effort is normal. No respiratory distress.     Breath sounds: Normal breath sounds. No wheezing or rales.  Chest:     Chest wall: No tenderness.  Abdominal:     General: Bowel sounds are normal. There is no distension.     Palpations: Abdomen is soft. There is no mass.     Tenderness: There is no abdominal tenderness.  Musculoskeletal:        General: No deformity. Normal range of motion.     Cervical back:  Normal range of motion and neck supple.  Lymphadenopathy:     Cervical: No cervical adenopathy.  Skin:    General: Skin is warm and dry.     Findings: No erythema or rash.  Neurological:     Mental Status: She is alert and oriented to person, place, and time. Mental status is at baseline.     Cranial Nerves: No cranial nerve deficit.     Coordination: Coordination normal.  Psychiatric:        Mood and Affect: Mood normal.        Behavior: Behavior normal.        Thought Content: Thought content normal.      LABORATORY DATA:  I have reviewed the data as listed Lab Results  Component Value Date   WBC 5.4 09/25/2020   HGB 13.0 09/25/2020   HCT 38.2 09/25/2020   MCV 89.7 09/25/2020   PLT 188 09/25/2020   Recent Labs    01/28/20 1439 07/07/20 0850 07/12/20 1730 09/25/20 0925  NA 141 143  --  137  K 4.6 5.3* 4.4 4.4  CL 104 108*  --  103  CO2 24 24  --  27  GLUCOSE 84 92  --  94  BUN 13 18  --  27*  CREATININE 0.84 0.93  --  0.96  CALCIUM 9.4 9.2  --  9.0  GFRNONAA 75 66  --  >60  GFRAA 86 76  --   --   PROT 6.3 5.9*  --  6.5  ALBUMIN 4.5 4.1  --  4.1  AST 16 19  --  27  ALT 14 22  --  24  ALKPHOS 101 95  --  81  BILITOT 0.3 0.3  --  0.6  BILIDIR 0.09 0.12  --   --    Iron/TIBC/Ferritin/ %Sat    Component Value Date/Time   IRON 68 08/14/2018 1341   TIBC 337 08/14/2018 1341   FERRITIN 26 08/14/2018 1341   IRONPCTSAT 20 08/14/2018 1341  Reviewed lab work which was done at PCPs office on 02/03/2018. Creatinine 0.83, sodium 139, potassium 4.3, calcium 9.7, bilirubin 0.4, AST 14 ALT 11 WBC 6, hemoglobin 10.9, MCV 79, platelet 223,000, normal differential. Ferritin 11  ASSESSMENT & PLAN:  1. History of kidney cancer    # History of Stage II renal cell carcinoma, radical nephrectomy 07/24/2015  CT images were independently reviewed by me and discussed with patient. No evidence of disease recurrence or metastatic disease. It has been slightly more than 5  years since her nephrectomy surgery. Labs are reviewed and discussed with patient.  Stable LDH. Discussed about future images will be clinically indicated. Recommend follow-up in 1 year.  History of hepatomegaly, patient has followed up with Dr. Allen Norris and had US liver Doppler done which showed no abnormality.  Continue follow-up with gastroenterology.  Orders Placed This Encounter  Procedures  . CBC with Differential/Platelet    Standing Status:   Future    Standing Expiration Date:   09/25/2021  . Comprehensive metabolic panel    Standing Status:   Future    Standing Expiration Date:   09/25/2021  . Lactate dehydrogenase    Standing Status:   Future    Standing Expiration Date:   09/25/2021    All questions were answered. The patient knows to call the clinic with any problems questions or concerns.  Return of visit: 1 year.   [name], MD, PhD Hematology Oncology [facility] Pager- [phone] 09/25/2020

## 2020-09-25 NOTE — Progress Notes (Signed)
Pt here for follow up. No new concerns voiced.   

## 2020-09-29 ENCOUNTER — Ambulatory Visit: Payer: 59 | Admitting: Internal Medicine

## 2020-09-29 ENCOUNTER — Other Ambulatory Visit (HOSPITAL_COMMUNITY)
Admission: RE | Admit: 2020-09-29 | Discharge: 2020-09-29 | Disposition: A | Discharge status: Home / Self Care | Payer: 59 | Source: Ambulatory Visit | Attending: Internal Medicine | Admitting: Internal Medicine

## 2020-09-29 ENCOUNTER — Other Ambulatory Visit: Payer: Self-pay

## 2020-09-29 ENCOUNTER — Encounter: Payer: Self-pay | Admitting: Internal Medicine

## 2020-09-29 VITALS — BP 138/84 | HR 58 | Temp 97.8°F | Ht 70.0 in | Wt 229.0 lb

## 2020-09-29 DIAGNOSIS — N840 Polyp of corpus uteri: Secondary | ICD-10-CM | POA: Diagnosis not present

## 2020-09-29 DIAGNOSIS — N760 Acute vaginitis: Secondary | ICD-10-CM | POA: Diagnosis not present

## 2020-09-29 NOTE — Patient Instructions (Addendum)
I think you have another endometrial polyp noted in 11/17/2012  Vaginitis  Vaginitis is a condition in which the vaginal tissue swells and becomes irritated. This condition is most often caused by a change in the normal balance of bacteria and yeast that live in the vagina. This change causes an overgrowth of certain bacteria or yeast, which causes the inflammation. There are different types of vaginitis. What are the causes? The cause of this condition depends on the type of vaginitis. It can be caused by:  Bacteria (bacterial vaginosis).  Yeast, which is a fungus (candidiasis).  A parasite (trichomoniasis vaginitis).  A virus (viral vaginitis).  Low hormone levels (atrophic vaginitis). Low hormone levels can occur during pregnancy, breastfeeding, or after menopause.  Irritants, such as bubble baths, scented tampons, and feminine sprays (allergic vaginitis). Other factors can change the normal balance of the yeast and bacteria that live in the vagina. These include:  Antibiotic medicines.  Poor hygiene.  Diaphragms, vaginal sponges, spermicides, birth control pills, and intrauterine devices (IUDs).  Sex.  Infection.  Uncontrolled diabetes.  A weakened body defense system (immune system). What increases the risk? This condition is more likely to develop in women who:  Smoke or are exposed to secondhand smoke.  Use vaginal douches, scented tampons, or scented sanitary pads.  Wear tight-fitting pants or thong underwear.  Use oral birth control pills or an IUD.  Have sex without a condom or have multiple partners.  Have an STI.  Frequently use the spermicide nonoxynol-9.  Eat lots of foods high in sugar or who have uncontrolled diabetes.  Have low estrogen levels.  Have a weakened immune system from an immune disorder or medical treatment.  Are pregnant or breastfeeding. What are the signs or symptoms? Symptoms vary depending on the cause of the vaginitis.  Common symptoms include:  Abnormal vaginal discharge. ? The discharge is white, gray, or yellow with bacterial vaginosis. ? The discharge is thick, white, and cheesy with a yeast infection. ? The discharge is frothy and yellow or greenish with trichomoniasis.  A bad vaginal smell. The smell is fishy with bacterial vaginosis.  Vaginal itching, pain, or swelling.  Pain with sex.  Pain or burning when urinating. Sometimes there are no symptoms. How is this diagnosed? This condition is diagnosed based on your symptoms and medical history. A physical exam, including a pelvic exam, will also be done. You may also have other tests, including:  Tests to determine the pH level (acidity or alkalinity) of your vagina.  A whiff test to assess the odor that results when a sample of your vaginal discharge is mixed with a potassium hydroxide solution.  Tests of vaginal fluid. A sample will be examined under a microscope. How is this treated? Treatment varies depending on the type of vaginitis you have. Your treatment may include:  Antibiotic creams or pills to treat bacterial vaginosis and trichomoniasis.  Antifungal medicines, such as vaginal creams or suppositories, to treat a yeast infection.  Medicine to ease discomfort if you have viral vaginitis. Your sexual partner should also be treated.  Estrogen delivered in a cream, pill, suppository, or vaginal ring to treat atrophic vaginitis. If vaginal dryness occurs, lubricants and moisturizing creams may help. You may need to avoid scented soaps, sprays, or douches.  Stopping use of a product that is causing allergic vaginitis and then using a vaginal cream to treat the symptoms. Follow these instructions at home: Lifestyle  Keep your genital area clean and dry. Avoid soap,  and only rinse the area with water.  Do not douche or use tampons until your health care provider says it is okay. Use sanitary pads, if needed.  Do not have sex until  your health care provider approves. When you can return to sex, practice safe sex and use condoms.  Wipe from front to back. This avoids the spread of bacteria from the rectum to the vagina. General instructions  Take over-the-counter and prescription medicines only as told by your health care provider.  If you were prescribed an antibiotic medicine, take or use it as told by your health care provider. Do not stop taking or using the antibiotic even if you start to feel better.  Keep all follow-up visits. This is important. How is this prevented?  Use mild, unscented products. Do not use things that can irritate the vagina, such as fabric softeners. Avoid the following products if they are scented: ? Feminine sprays. ? Detergents. ? Tampons. ? Feminine hygiene products. ? Soaps or bubble baths.  Let air reach your genital area. To do this: ? Wear cotton underwear to reduce moisture buildup. ? Avoid wearing underwear while you sleep. ? Avoid wearing tight pants and underwear or nylons without a cotton panel. ? Avoid wearing thong underwear.  Take off any wet clothing, such as bathing suits, as soon as possible.  Practice safe sex and use condoms. Contact a health care provider if:  You have abdominal or pelvic pain.  You have a fever or chills.  You have symptoms that last for more than 2-3 days. Get help right away if:  You have a fever and your symptoms suddenly get worse. Summary  Vaginitis is a condition in which the vaginal tissue becomes inflamed.This condition is most often caused by a change in the normal balance of bacteria and yeast that live in the vagina.  Treatment varies depending on the type of vaginitis you have.  Do not douche, use tampons, or have sex until your health care provider approves. When you can return to sex, practice safe sex and use condoms. This information is not intended to replace advice given to you by your health care provider. Make  sure you discuss any questions you have with your health care provider. Document Revised: 02/17/2020 Document Reviewed: 02/17/2020 Elsevier Patient Education  [place].

## 2020-09-29 NOTE — Progress Notes (Signed)
Chief Complaint  Patient presents with  . Vaginal Discharge   F/u  1. Vaginal discharge yellow white gel like thick with slight odor x 1 month denies pelvic pain or itching new partner x 1 year and she does bleed sometimes after sex 01/2020 pap neg neg HPV but 11/17/12 endometrial polyp Dr. Enzo Bi bx'ed and benign    Review of Systems  Constitutional: Negative for weight loss.  Respiratory: Negative for shortness of breath.   Cardiovascular: Negative for chest pain.  Genitourinary:       +vaginal discharge x 1 month new     Past Medical History:  Diagnosis Date  . Cancer ([place])    skin  . Chicken pox   . Colon polyps   . Depression   . Diabetes mellitus ([place])   . GERD (gastroesophageal reflux disease)   . Hyperlipidemia   . Hypertension   . 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 Urinary incontinence    Past Surgical History:  Procedure Laterality Date  . CHOLECYSTECTOMY  2001  . COLONOSCOPY WITH PROPOFOL N/A 03/27/2016   Procedure: COLONOSCOPY WITH PROPOFOL;  Surgeon: Lucilla Lame, MD;  Location: [facility] ENDOSCOPY;  Service: Endoscopy;  Laterality: N/A;  . KNEE SURGERY  2003-2009   s/p 8 knee surgeries (with 3 knee replacements)  . NEPHRECTOMY RADICAL  07/2015  . NISSEN FUNDOPLICATION     Family History  Problem Relation Age of Onset  . Colon cancer Mother        died - age 39  . Heart disease Father   . Hypertension Father   . Lymphoma Father   . Arthritis/Rheumatoid Sister   . Lung cancer Brother        smoked  . Arthritis/Rheumatoid Paternal Grandmother   . Cancer Maternal Uncle        Unknown type of cancer   . Cancer Paternal Aunt        Unknown type of cancer   . Throat cancer Paternal Uncle   . Colon cancer Paternal Uncle   . Breast cancer Neg Hx    Social History   Socioeconomic History  . Marital status: Divorced    Spouse name: Not on file  . Number of children: 2  . Years of education: Not on file  . Highest education level: Not on file  Occupational  History  . Not on file  Tobacco Use  . Smoking status: Former Smoker    Packs/day: 1.00    Years: 15.00    Pack years: 15.00    Types: Cigarettes    Quit date: 10/23/2012    Years since quitting: 7.9  . Smokeless tobacco: Never Used  . Tobacco comment: Quit Feb 2014  Vaping Use  . Vaping Use: Former  . Start date: 02/20/2013  . Quit date: 02/20/2014  Substance and Sexual Activity  . Alcohol use: Yes    Alcohol/week: 1.0 standard drink    Types: 1 Glasses of wine per week    Comment: social drinking - approx 2-4 monthly   . Drug use: No  . Sexual activity: Not Currently  Other Topics Concern  . Not on file  Social History Narrative  . Not on file   Social Determinants of Health   Financial Resource Strain: Not on file  Food Insecurity: Not on file  Transportation Needs: Not on file  Physical Activity: Not on file  Stress: Not on file  Social Connections: Not on file  Intimate Partner Violence: Not on file  Current Meds  Medication Sig  . bisoprolol-hydrochlorothiazide (ZIAC) 2.5-6.25 MG tablet TAKE 1 TABLET BY MOUTH DAILY FOR HIGH BLOOD PRESSURE  . butalbital-acetaminophen-caffeine (FIORICET, ESGIC) 50-325-40 MG tablet TAKE ONE TABLET EACH DAY AS NEEDED FOR HEADACHE  . citalopram (CELEXA) 40 MG tablet TAKE 1 TABLET BY MOUTH DAILY  . clonazePAM (KLONOPIN) 0.5 MG tablet TAKE 1 TABLET BY MOUTH EVERY DAY AS NEEDED  . clopidogrel (PLAVIX) 75 MG tablet TAKE 1 TABLET BY MOUTH EVERY DAY  . diphenoxylate-atropine (LOMOTIL) 2.5-0.025 MG tablet TAKE 1 TABLET BY MOUTH 4 TIMES DAILY AS NEEDED FOR DIARRHEA OR LOOSE STOOLS  . hydrOXYzine (ATARAX/VISTARIL) 10 MG tablet Take 2 tablets q hs prn  . Loperamide HCl (IMODIUM PO) Take by mouth as needed.  . meloxicam (MOBIC) 7.5 MG tablet TAKE ONE TABLET TWICE DAILY  . nystatin (MYCOSTATIN/NYSTOP) powder Apply 1 application topically 2 (two) times daily.  Marland Kitchen nystatin cream (MYCOSTATIN) Apply 1 application topically 2 (two) times daily.  .  rosuvastatin (CRESTOR) 5 MG tablet TAKE 1 TABLET BY MOUTH DAILY  . torsemide (DEMADEX) 20 MG tablet TAKE 1 TABLET BY MOUTH EVERY DAY AS NEEDED.   Allergies  Allergen Reactions  . Codeine Nausea Only  . Vicodin [Hydrocodone-Acetaminophen] Itching   Recent Results (from the past 2160 hour(s))  Hemoglobin A1c     Status: None   Collection Time: 07/07/20  8:50 AM  Result Value Ref Range   Hgb A1c MFr Bld 5.2 4.8 - 5.6 %    Comment:          Prediabetes: 5.7 - 6.4          Diabetes: >6.4          Glycemic control for adults with diabetes: <7.0    Est. average glucose Bld gHb Est-mCnc 103 mg/dL  Lipid panel     Status: None   Collection Time: 07/07/20  8:50 AM  Result Value Ref Range   Cholesterol, Total 138 100 - 199 mg/dL   Triglycerides 79 0 - 149 mg/dL   HDL 46 >39 mg/dL   VLDL Cholesterol Cal 15 5 - 40 mg/dL   LDL Chol Calc (NIH) 77 0 - 99 mg/dL   Chol/HDL Ratio 3.0 0.0 - 4.4 ratio    Comment:                                   T. Chol/HDL Ratio                                             Men  Women                               1/2 Avg.Risk  3.4    3.3                                   Avg.Risk  5.0    4.4                                2X Avg.Risk  9.6    7.1  3X Avg.Risk 23.4   56.8   Basic metabolic panel     Status: Abnormal   Collection Time: 07/07/20  8:50 AM  Result Value Ref Range   Glucose 92 65 - 99 mg/dL   BUN 18 8 - 27 mg/dL   Creatinine, Ser 0.93 0.57 - 1.00 mg/dL   GFR calc non Af Amer 66 >59 mL/min/1.73   GFR calc Af Amer 76 >59 mL/min/1.73    Comment: **In accordance with recommendations from the NKF-ASN Task force,**   [facility] is in the process of updating its eGFR calculation to the   2021 CKD-EPI creatinine equation that estimates kidney function   without a race variable.    BUN/Creatinine Ratio 19 12 - 28   Sodium 143 134 - 144 mmol/L   Potassium 5.3 (H) 3.5 - 5.2 mmol/L   Chloride 108 (H) 96 - 106 mmol/L   CO2 24 20  - 29 mmol/L   Calcium 9.2 8.7 - 10.3 mg/dL  Hepatic function panel     Status: Abnormal   Collection Time: 07/07/20  8:50 AM  Result Value Ref Range   Total Protein 5.9 (L) 6.0 - 8.5 g/dL   Albumin 4.1 3.8 - 4.8 g/dL   Bilirubin Total 0.3 0.0 - 1.2 mg/dL   Bilirubin, Direct 0.12 0.00 - 0.40 mg/dL   Alkaline Phosphatase 95 44 - 121 IU/L    Comment:               **Please note reference interval change**   AST 19 0 - 40 IU/L   ALT 22 0 - 32 IU/L  Potassium     Status: None   Collection Time: 07/12/20  5:30 PM  Result Value Ref Range   Potassium 4.4 3.5 - 5.1 mmol/L    Comment: Performed at [facility], [address] [place], [place] [address]  HM DIABETES FOOT EXAM     Status: None   Collection Time: 08/04/20 12:00 AM  Result Value Ref Range   HM Diabetic Foot Exam on my exam.    Lactate dehydrogenase     Status: None   Collection Time: 09/25/20  9:25 AM  Result Value Ref Range   LDH 131 98 - 192 U/L    Comment: Performed at [facility], [address] [place], [place] [address]  Comprehensive metabolic panel     Status: Abnormal   Collection Time: 09/25/20  9:25 AM  Result Value Ref Range   Sodium 137 135 - 145 mmol/L   Potassium 4.4 3.5 - 5.1 mmol/L   Chloride 103 98 - 111 mmol/L   CO2 27 22 - 32 mmol/L   Glucose, Bld 94 70 - 99 mg/dL    Comment: Glucose reference range applies only to samples taken after fasting for at least 8 hours.   BUN 27 (H) 8 - 23 mg/dL   Creatinine, Ser 0.96 0.44 - 1.00 mg/dL   Calcium 9.0 8.9 - 10.3 mg/dL   Total Protein 6.5 6.5 - 8.1 g/dL   Albumin 4.1 3.5 - 5.0 g/dL   AST 27 15 - 41 U/L   ALT 24 0 - 44 U/L   Alkaline Phosphatase 81 38 - 126 U/L   Total Bilirubin 0.6 0.3 - 1.2 mg/dL   GFR, Estimated >60 >60 mL/min    Comment: (NOTE) Calculated using the CKD-EPI Creatinine Equation (2021)    Anion gap 7 5 - 15    Comment: Performed at [facility], [address] [place], [place] [address]  CBC with  Differential/Platelet     Status: None   Collection Time: 09/25/20  9:25 AM  Result Value Ref Range   WBC 5.4 4.0 - 10.5 K/uL   RBC 4.26 3.87 - 5.11 MIL/uL   Hemoglobin 13.0 12.0 - 15.0 g/dL   HCT 38.2 36.0 - 46.0 %   MCV 89.7 80.0 - 100.0 fL   MCH 30.5 26.0 - 34.0 pg   MCHC 34.0 30.0 - 36.0 g/dL   RDW 12.2 11.5 - 15.5 %   Platelets 188 150 - 400 K/uL   nRBC 0.0 0.0 - 0.2 %   Neutrophils Relative % 63 %   Neutro Abs 3.5 1.7 - 7.7 K/uL   Lymphocytes Relative 26 %   Lymphs Abs 1.4 0.7 - 4.0 K/uL   Monocytes Relative 7 %   Monocytes Absolute 0.4 0.1 - 1.0 K/uL   Eosinophils Relative 4 %   Eosinophils Absolute 0.2 0.0 - 0.5 K/uL   Basophils Relative 0 %   Basophils Absolute 0.0 0.0 - 0.1 K/uL   Immature Granulocytes 0 %   Abs Immature Granulocytes 0.02 0.00 - 0.07 K/uL    Comment: Performed at [facility], [address] [place], [place] [address]   Objective  Body mass index is 32.86 kg/m. Wt Readings from Last 3 Encounters:  09/29/20 229 lb (103.9 kg)  09/25/20 229 lb 12.8 oz (104.2 kg)  08/04/20 223 lb (101.2 kg)   Temp Readings from Last 3 Encounters:  09/29/20 97.8 F (36.6 C) (Oral)  09/25/20 (!) 97.3 F (36.3 C)  08/04/20 98.2 F (36.8 C) (Oral)   BP Readings from Last 3 Encounters:  09/29/20 138/84  09/25/20 116/73  08/04/20 130/78   Pulse Readings from Last 3 Encounters:  09/29/20 (!) 58  09/25/20 (!) 55  08/04/20 63    Physical Exam Vitals and nursing note reviewed.  Constitutional:      Appearance: Normal appearance. She is well-developed and well-groomed. She is obese.  HENT:     Head: Normocephalic and atraumatic.  Cardiovascular:     Rate and Rhythm: Normal rate and regular rhythm.     Heart sounds: Normal heart sounds. No murmur heard.   Pulmonary:     Effort: Pulmonary effort is normal.     Breath sounds: Normal breath sounds.  Genitourinary:    Pubic Area: No rash.      Labia:        Right: No rash.        Left: No rash.       Vagina: Normal.     Cervix: Discharge and friability present.     Uterus: Absent.      Adnexa: Right adnexa normal and left adnexa normal.     Comments: ?recurrent endometrial polyp as has tissue in os  Skin:    General: Skin is warm and dry.  Neurological:     General: No focal deficit present.     Mental Status: She is alert and oriented to person, place, and time. Mental status is at baseline.  Psychiatric:        Attention and Perception: Attention and perception normal.        Mood and Affect: Mood and affect normal.        Speech: Speech normal.        Behavior: Behavior normal. Behavior is cooperative.        Thought Content: Thought content normal.  Cognition and Memory: Cognition and memory normal.        Judgment: Judgment normal.     Assessment  Plan  Acute vaginitis - Plan: Cervicovaginal ancillary only( [place]), Ambulatory referral to Obstetrics / Gynecology  Endometrial polyp - Plan: Ambulatory referral to Obstetrics / Gynecology Dr. Georgianne Fick    Provider: Dr. Olivia Mackie McLean-Scocuzza-Internal Medicine

## 2020-10-02 ENCOUNTER — Other Ambulatory Visit: Payer: Self-pay | Admitting: Internal Medicine

## 2020-10-02 DIAGNOSIS — N760 Acute vaginitis: Secondary | ICD-10-CM

## 2020-10-02 DIAGNOSIS — B9689 Other specified bacterial agents as the cause of diseases classified elsewhere: Secondary | ICD-10-CM

## 2020-10-02 LAB — CERVICOVAGINAL ANCILLARY ONLY
Bacterial Vaginitis (gardnerella): POSITIVE — AB
Candida Glabrata: NEGATIVE
Candida Vaginitis: NEGATIVE
Chlamydia: NEGATIVE
Comment: NEGATIVE
Comment: NEGATIVE
Comment: NEGATIVE
Comment: NEGATIVE
Comment: NEGATIVE
Comment: NORMAL
Neisseria Gonorrhea: NEGATIVE
Trichomonas: NEGATIVE

## 2020-10-02 MED ORDER — METRONIDAZOLE 500 MG PO TABS: 500.0000 mg | ORAL_TABLET | Freq: Two times a day (BID) | ORAL | 0 refills | Status: DC

## 2020-10-04 ENCOUNTER — Other Ambulatory Visit: Payer: Self-pay

## 2020-10-04 ENCOUNTER — Encounter: Payer: Self-pay | Admitting: Obstetrics and Gynecology

## 2020-10-04 ENCOUNTER — Ambulatory Visit (INDEPENDENT_AMBULATORY_CARE_PROVIDER_SITE_OTHER): Payer: 59 | Admitting: Obstetrics and Gynecology

## 2020-10-04 VITALS — BP 136/82 | Ht 70.0 in | Wt 232.0 lb

## 2020-10-04 DIAGNOSIS — N930 Postcoital and contact bleeding: Secondary | ICD-10-CM | POA: Diagnosis not present

## 2020-10-04 DIAGNOSIS — N760 Acute vaginitis: Secondary | ICD-10-CM | POA: Diagnosis not present

## 2020-10-04 DIAGNOSIS — N950 Postmenopausal bleeding: Secondary | ICD-10-CM

## 2020-10-04 NOTE — Progress Notes (Signed)
Gynecology H&P  Chief Complaint:  Chief Complaint  Patient presents with  . Vaginitis    Cervical polyp - RM 4    History of Present Illness: Patient is a 65 y.o. No obstetric history on file. presents evaluation of postmenopausal bleeding at the request of Dr. Hinton Lovely, MD at [facility] in [place]. The patient states she has had a multiple episode(s) of bleeding in the past 1 month(s).  Mainly presented because of vaginal discharge.  .  The bleeding has been limited to spotting . Discharge has been yellow off white.  She has noted some postcoital spotting.  She has had no additional complaints.  She does not have a history of abnormal pap smears.  Her last pap smear was obtained on 01/07/2020 and was read as NIL and HR HPV negative .  The patient is currently sexually active and has noted postcoital bleeding There are no other aggravating factors reported. There are no alleviating factors reported. The patient's past medical history is notable for a prior cervical polyp removed in 2014 with benign pathology.   She has nothad prior work up for postmenopausal bleeding.  Review of Systems: 10 point review of systems negative unless otherwise noted in HPI  Past Medical History:  Patient Active Problem List   Diagnosis Date Noted  . Vaginal discomfort 01/09/2020  . Right sided abdominal pain 01/09/2020  . Rash 01/09/2020  . Vitamin D deficiency 08/09/2018  . B12 deficiency 08/09/2018  . SVT (supraventricular tachycardia) ([place]) 01/05/2018  . Personal history of renal cell carcinoma 08/20/2017  . Headache 06/22/2017  . Status post total bilateral knee replacement 06/04/2017  . History of IBS 05/21/2016  . Depression 05/08/2016  . Noninfectious diarrhea   . Benign neoplasm of sigmoid colon   . H/O supraventricular tachycardia 03/20/2016  . History of renal cell carcinoma 06/12/2015    Evaluated by Dr [name] - CT scan with renal mass protocol and CXR.     Marland Kitchen Renal  mass, left 06/02/2015    Formatting of this note might be different from the original. S/p left nephrectomy 07/2015   . Mixed stress and urge urinary incontinence 06/02/2015  . Fatigue 05/07/2015  . Health care maintenance 05/07/2015    Mammogram 07/10/20 - Birads I.    . Nasal lesion 01/30/2014  . Elevated alkaline phosphatase level 10/17/2013  . Bradycardia 08/22/2013  . Diarrhea 04/18/2013  . Tachycardia 10/25/2012  . Anemia 10/25/2012  . Diabetes mellitus ([place]) 06/28/2012  . Hypertension 06/28/2012  . Hypercholesteremia 06/28/2012  . GERD (gastroesophageal reflux disease) 06/28/2012  . IBS (irritable bowel syndrome) 06/28/2012    Colonoscopy 05/14/13 - entire colon normal.  Recommend a f/u colonoscopy in 05/2016     Past Surgical History:  Past Surgical History:  Procedure Laterality Date  . CHOLECYSTECTOMY  2001  . COLONOSCOPY WITH PROPOFOL N/A 03/27/2016   Procedure: COLONOSCOPY WITH PROPOFOL;  Surgeon: Lucilla Lame, MD;  Location: [facility] ENDOSCOPY;  Service: Endoscopy;  Laterality: N/A;  . KNEE SURGERY  2003-2009   s/p 8 knee surgeries (with 3 knee replacements)  . NEPHRECTOMY RADICAL  07/2015  . NISSEN FUNDOPLICATION      Family History:  Family History  Problem Relation Age of Onset  . Colon cancer Mother        died - age 63  . Heart disease Father   . Hypertension Father   . Lymphoma Father   . Arthritis/Rheumatoid Sister   . Lung cancer Brother  smoked  . Arthritis/Rheumatoid Paternal Grandmother   . Cancer Maternal Uncle        Unknown type of cancer   . Cancer Paternal Aunt        Unknown type of cancer   . Throat cancer Paternal Uncle   . Colon cancer Paternal Uncle   . Breast cancer Neg Hx     Social History:  Social History   Socioeconomic History  . Marital status: Divorced    Spouse name: Not on file  . Number of children: 2  . Years of education: Not on file  . Highest education level: Not on file  Occupational History  . Not on  file  Tobacco Use  . Smoking status: Former Smoker    Packs/day: 1.00    Years: 15.00    Pack years: 15.00    Types: Cigarettes    Quit date: 10/23/2012    Years since quitting: 7.9  . Smokeless tobacco: Never Used  . Tobacco comment: Quit Feb 2014  Vaping Use  . Vaping Use: Former  . Start date: 02/20/2013  . Quit date: 02/20/2014  Substance and Sexual Activity  . Alcohol use: Yes    Alcohol/week: 1.0 standard drink    Types: 1 Glasses of wine per week    Comment: social drinking - approx 2-4 monthly   . Drug use: No  . Sexual activity: Not Currently  Other Topics Concern  . Not on file  Social History Narrative  . Not on file   Social Determinants of Health   Financial Resource Strain: Not on file  Food Insecurity: Not on file  Transportation Needs: Not on file  Physical Activity: Not on file  Stress: Not on file  Social Connections: Not on file  Intimate Partner Violence: Not on file    Allergies:  Allergies  Allergen Reactions  . Codeine Nausea Only  . Vicodin [Hydrocodone-Acetaminophen] Itching    Medications: Prior to Admission medications   Medication Sig Start Date End Date Taking? Authorizing Provider  bisoprolol-hydrochlorothiazide (ZIAC) 2.5-6.25 MG tablet TAKE 1 TABLET BY MOUTH DAILY FOR HIGH BLOOD PRESSURE 09/21/19  Yes Einar Pheasant, MD  butalbital-acetaminophen-caffeine (FIORICET, ESGIC) [phone] MG tablet TAKE ONE TABLET EACH DAY AS NEEDED FOR HEADACHE 06/26/17  Yes Einar Pheasant, MD  citalopram (CELEXA) 40 MG tablet TAKE 1 TABLET BY MOUTH DAILY 09/21/19  Yes Einar Pheasant, MD  clonazePAM (KLONOPIN) 0.5 MG tablet TAKE 1 TABLET BY MOUTH EVERY DAY AS NEEDED 06/28/20  Yes Einar Pheasant, MD  clopidogrel (PLAVIX) 75 MG tablet TAKE 1 TABLET BY MOUTH EVERY DAY 02/01/20  Yes Einar Pheasant, MD  diphenoxylate-atropine (LOMOTIL) 2.5-0.025 MG tablet TAKE 1 TABLET BY MOUTH 4 TIMES DAILY AS NEEDED FOR DIARRHEA OR LOOSE STOOLS 12/17/19  Yes Lucilla Lame, MD   hydrOXYzine (ATARAX/VISTARIL) 10 MG tablet Take 2 tablets q hs prn 08/16/20  Yes Einar Pheasant, MD  Loperamide HCl (IMODIUM PO) Take by mouth as needed.   Yes [provider]  meloxicam (MOBIC) 7.5 MG tablet TAKE ONE TABLET TWICE DAILY 10/19/15  Yes Einar Pheasant, MD  metroNIDAZOLE (FLAGYL) 500 MG tablet Take 1 tablet (500 mg total) by mouth 2 (two) times daily. With food avoid alcohol 10/02/20  Yes McLean-Scocuzza, Nino Glow, MD  nystatin (MYCOSTATIN/NYSTOP) powder Apply 1 application topically 2 (two) times daily. 01/07/20  Yes Einar Pheasant, MD  nystatin cream (MYCOSTATIN) Apply 1 application topically 2 (two) times daily. 06/26/17  Yes Einar Pheasant, MD  rosuvastatin (CRESTOR) 5 MG tablet TAKE  1 TABLET BY MOUTH DAILY 09/21/19  Yes Einar Pheasant, MD  torsemide (DEMADEX) 20 MG tablet TAKE 1 TABLET BY MOUTH EVERY DAY AS NEEDED. 11/06/18  Yes Einar Pheasant, MD  cyanocobalamin (,VITAMIN B-12,) 1000 MCG/ML injection INJECT INTO THE MUSCLE ONCE A WEEK FOR 4WEEKS AND THEN EVERY 30 DAYS Patient not taking: Reported on 09/29/2020 06/27/20   Einar Pheasant, MD  Misc Natural Products (ALLERGY RELEAF SYSTEM PO) Take by mouth. Reported on 03/20/2016 Patient not taking: Reported on 09/29/2020    [provider]  SYRINGE-NEEDLE, DISP, 3 ML 25G X 1" 3 ML MISC Use as directed with b12 injections Patient not taking: Reported on 09/29/2020 02/04/20   Einar Pheasant, MD  Vitamin D, Ergocalciferol, (DRISDOL) 1.25 MG (50000 UNIT) CAPS capsule Take 1 capsule (50,000 Units total) by mouth every 7 (seven) days. Patient not taking: Reported on 09/29/2020 02/04/20   Einar Pheasant, MD    Physical Exam Vitals: Blood pressure 136/82, height 5\' 10"  (1.778 m), weight 232 lb (105.2 kg).  General: NAD HEENT: normocephalic, anicteric Pulmonary: No increased work of breathing Genitourinary:  External: Normal external female genitalia.  Normal urethral meatus, normal  Bartholin's and Skene's glands.     Vagina: Normal vaginal mucosa, no evidence of prolapse.    Cervix: Grossly normal in appearance, contact bleeding noted.  Stenotic,  Uterus: Non-enlarged, mobile, normal contour.  No CMT  Adnexa: ovaries non-enlarged, no adnexal masses  Rectal: deferred Extremities: no edema, erythema, or tenderness Neurologic: Grossly intact Psychiatric: mood appropriate, affect full  Assessment: 65 y.o. No obstetric history on file. presenting for evaluation of postmenopausal bleeding  Plan: Problem List Items Addressed This Visit   None   Visit Diagnoses    Postcoital and contact bleeding    -  Primary   PMB (postmenopausal bleeding)          1) We discussed that menopause is a clinical diagnosis made after 12 months of amenorrhea.  The average age of menopause in the  [place] population is 41 but there may be significant variation.  Any bleeding that happens after a 12 month period of amenorrhea warrants further work.  Possible etiologies of postmenopausal bleeding were discussed with the patient today.  These may range from benign etiologies such as urethral prolapse and atrophy, to indeterminate lesions such as submucosal fibroids or polyps which would require resection to accurately evaluate. The role of unopposed estrogen in the development of  dndometrial hyperplasia or carcinoma is discussed.  The risk of endometrial hyperplasia is linearly correlated with increasing BMI given the production of estrone by adipose tissue.  Work up will be include transvaginal ultrasound to assess the thickness of the endometrial lining as well as to assess for focal uterine lesions.  Negative ultrasound evaluation, defined as the absence of focal lesions and endometrial stripe of <17mm, effectively rules out carcinoma and confirms atrophy as the most likely etiology.  Should focal lesions be present these generally require hysteroscopic resection.  Should lining be greater >25mm endometrial biopsy is warranted to  rule out hyperplasia or frank endometrial cancer.  Continued episodes of bleeding despite negative ultrasound also warrant endometrial sampling.  As the cervical pathology may also be implicated in postmenopausal bleeding prior cervical cytology was reviewed and repeated if required per ASCCP guidelines.  - recent cervical culture negative for GC/CT candida but positive BV.  Will send nuswab - pap up to date 01/07/2020 NILM HPV negative - Cervical polyp removed 11/11/2012 showing benign endocervical polyp - normal  appearing cervix other than reproducible contact bleeding on exam.  If cultures and work up negative discussed trial of doxycyline for cervicitic, if thickening lining on ultrasound endometrial biopsy  2) Evaluation by pelvic ultrasound scheduled, with follow up after [place]. EMB discussed and may be performed as well. Pros and cons of these modalities of testing discussed.   3) Return in about 1 week (around 10/11/2020) for TVUS and follow up.   Malachy Mood, MD, [facility], [facility] 10/04/2020, 11:18 AM

## 2020-10-08 LAB — NUSWAB BV AND CANDIDA, NAA

## 2020-10-11 ENCOUNTER — Telehealth: Payer: Self-pay

## 2020-10-11 NOTE — Telephone Encounter (Signed)
Called and left voicemail for patient to call back to be scheduled. Patient aware AMS is not in office today but will be tomorrow

## 2020-10-16 ENCOUNTER — Other Ambulatory Visit: Payer: 59

## 2020-10-16 ENCOUNTER — Ambulatory Visit: Payer: 59 | Admitting: Obstetrics and Gynecology

## 2020-10-17 ENCOUNTER — Other Ambulatory Visit (INDEPENDENT_AMBULATORY_CARE_PROVIDER_SITE_OTHER): Payer: 59

## 2020-10-17 ENCOUNTER — Encounter: Payer: Self-pay | Admitting: Obstetrics and Gynecology

## 2020-10-17 ENCOUNTER — Ambulatory Visit (INDEPENDENT_AMBULATORY_CARE_PROVIDER_SITE_OTHER): Payer: 59 | Admitting: Obstetrics and Gynecology

## 2020-10-17 ENCOUNTER — Other Ambulatory Visit: Payer: Self-pay | Admitting: Obstetrics and Gynecology

## 2020-10-17 ENCOUNTER — Other Ambulatory Visit: Payer: Self-pay

## 2020-10-17 VITALS — BP 128/82 | Wt 228.0 lb

## 2020-10-17 DIAGNOSIS — Z113 Encounter for screening for infections with a predominantly sexual mode of transmission: Secondary | ICD-10-CM

## 2020-10-17 DIAGNOSIS — N950 Postmenopausal bleeding: Secondary | ICD-10-CM

## 2020-10-17 DIAGNOSIS — N930 Postcoital and contact bleeding: Secondary | ICD-10-CM

## 2020-10-17 DIAGNOSIS — N761 Subacute and chronic vaginitis: Secondary | ICD-10-CM | POA: Diagnosis not present

## 2020-10-17 MED ORDER — DOXYCYCLINE HYCLATE 100 MG PO CAPS: 100.0000 mg | ORAL_CAPSULE | Freq: Two times a day (BID) | ORAL | 0 refills | Status: DC

## 2020-10-17 NOTE — Progress Notes (Signed)
Gynecology Ultrasound Follow Up  Chief Complaint:  Chief Complaint  Patient presents with   Follow-up    TVUS - RM 4     History of Present Illness: Patient is a 65 y.o. female who presents today for ultrasound evaluation of postmenopausal bleeding.  Ultrasound demonstrates the following findgins Adnexa: No adnexal masses Uterus: Homogenous myometrial echotexutre, no evidence of fibroids with endometrial stripe 2.19mmm, no focal abnormalities Additional: no free fluid  Review of Systems: Review of Systems  Constitutional: Negative.   Gastrointestinal: Negative.   Genitourinary: Negative.   Skin: Negative.     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 Patient is postmenopausal.   Family History:  Family History  Problem Relation Age of Onset   Colon cancer Mother        died - age 83   Heart disease Father    Hypertension Father    Lymphoma Father    Arthritis/Rheumatoid Sister    Lung cancer Brother        smoked   Arthritis/Rheumatoid Paternal Grandmother    Cancer Maternal Uncle        Unknown type of cancer    Cancer Paternal Aunt        Unknown type of cancer    Throat cancer Paternal Uncle    Colon cancer Paternal Uncle     Breast cancer Neg Hx     Social History:  Social History   Socioeconomic History   Marital status: Divorced    Spouse name: Not on file   Number of children: 2   Years of education: Not on file   Highest education level: Not on file  Occupational History   Not on file  Tobacco Use   Smoking status: Former Smoker    Packs/day: 1.00    Years: 15.00    Pack years: 15.00    Types: Cigarettes    Quit date: 10/23/2012    Years since quitting: 7.9   Smokeless tobacco: Never Used   Tobacco comment: Quit Feb 2014  Vaping Use   Vaping Use: Former   Start date: 02/20/2013   Quit date: 02/20/2014  Substance and Sexual Activity   Alcohol use: Yes    Alcohol/week: 1.0 standard drink    Types: 1 Glasses of wine per week    Comment: social drinking - approx 2-4 monthly    Drug use: No   Sexual activity: Not Currently  Other Topics Concern   Not on file  Social History Narrative   Not on file   Social Determinants of Health   Financial Resource Strain: Not on file  Food Insecurity: Not on file  Transportation  Needs: Not on file  Physical Activity: Not on file  Stress: Not on file  Social Connections: Not on file  Intimate Partner Violence: Not on file    Allergies:  Allergies  Allergen Reactions   Codeine Nausea Only   Vicodin [Hydrocodone-Acetaminophen] Itching    Medications: Prior to Admission medications   Medication Sig Start Date End Date Taking? Authorizing Provider  bisoprolol-hydrochlorothiazide (ZIAC) 2.5-6.25 MG tablet TAKE 1 TABLET BY MOUTH DAILY FOR HIGH BLOOD PRESSURE 09/21/19  Yes Einar Pheasant, MD  butalbital-acetaminophen-caffeine (FIORICET, ESGIC) [phone] MG tablet TAKE ONE TABLET EACH DAY AS NEEDED FOR HEADACHE 06/26/17  Yes Einar Pheasant, MD  citalopram (CELEXA) 40 MG tablet TAKE 1 TABLET BY MOUTH DAILY 09/21/19  Yes Einar Pheasant, MD  clonazePAM (KLONOPIN) 0.5 MG tablet TAKE 1 TABLET BY MOUTH EVERY DAY AS NEEDED 06/28/20  Yes  Einar Pheasant, MD  clopidogrel (PLAVIX) 75 MG tablet TAKE 1 TABLET BY MOUTH EVERY DAY 02/01/20  Yes Einar Pheasant, MD  diphenoxylate-atropine (LOMOTIL) 2.5-0.025 MG tablet TAKE 1 TABLET BY MOUTH 4 TIMES DAILY AS NEEDED FOR DIARRHEA OR LOOSE STOOLS 12/17/19  Yes Lucilla Lame, MD  doxycycline (VIBRAMYCIN) 100 MG capsule Take 1 capsule (100 mg total) by mouth 2 (two) times daily. 10/17/20  Yes Malachy Mood, MD  hydrOXYzine (ATARAX/VISTARIL) 10 MG tablet Take 2 tablets q hs prn 08/16/20  Yes Einar Pheasant, MD  Loperamide HCl (IMODIUM PO) Take by mouth as needed.   Yes [provider]  meloxicam (MOBIC) 7.5 MG tablet TAKE ONE TABLET TWICE DAILY 10/19/15  Yes Einar Pheasant, MD  metroNIDAZOLE (FLAGYL) 500 MG tablet Take 1 tablet (500 mg total) by mouth 2 (two) times daily. With food avoid alcohol 10/02/20  Yes McLean-Scocuzza, Nino Glow, MD  nystatin (MYCOSTATIN/NYSTOP) powder Apply 1 application topically 2 (two) times daily. 01/07/20  Yes Einar Pheasant, MD  nystatin cream (MYCOSTATIN) Apply 1 application topically 2 (two) times daily. 06/26/17  Yes Einar Pheasant, MD  rosuvastatin (CRESTOR) 5 MG tablet TAKE 1 TABLET BY MOUTH DAILY 09/21/19  Yes Einar Pheasant, MD  torsemide (DEMADEX) 20 MG tablet TAKE 1 TABLET BY MOUTH EVERY DAY AS NEEDED. 11/06/18  Yes Einar Pheasant, MD  cyanocobalamin (,VITAMIN B-12,) 1000 MCG/ML injection INJECT INTO THE MUSCLE ONCE A WEEK FOR 4WEEKS AND THEN EVERY 30 DAYS Patient not taking: Reported on 09/29/2020 06/27/20   Einar Pheasant, MD  Misc Natural Products (ALLERGY RELEAF SYSTEM PO) Take by mouth. Reported on 03/20/2016 Patient not taking: Reported on 09/29/2020    [provider]  SYRINGE-NEEDLE, DISP, 3 ML 25G X 1" 3 ML MISC Use as directed with b12 injections Patient not taking: Reported on 09/29/2020 02/04/20   Einar Pheasant, MD  Vitamin D, Ergocalciferol, (DRISDOL) 1.25 MG (50000 UNIT) CAPS capsule Take 1 capsule (50,000 Units total) by mouth  every 7 (seven) days. Patient not taking: Reported on 09/29/2020 02/04/20   Einar Pheasant, MD    Physical Exam Vitals: Blood pressure 128/82, weight 228 lb (103.4 kg).  General: NAD HEENT: normocephalic, anicteric Pulmonary: No increased work of breathing Extremities: no edema, erythema, or tenderness Neurologic: Grossly intact, normal gait Psychiatric: mood appropriate, affect full  US PELVIS TRANSVAGINAL NON-OB (TV ONLY)  Result Date: 10/17/2020 Patient Name: Jacqueline Nichols DOB: [birth year] MRN: [id number] ULTRASOUND REPORT Location: [facility] Date of Service: 10/17/2020 Indications:Evaluate the endometrial lining Findings: The uterus is anteverted and measures 5.4 x 4.3 x 3.2 cm. Echo texture is homogenous without evidence of focal masses. The Endometrium measures 2.3  mm. There is 3.8 mm AP of anechoic fluid throughout the endometrial canal. Right Ovary measures 3.2 x 1.4 x 1.0  cm. It is normal in appearance. Left Ovary measures 2.2 x 1.1 x 0.9 cm. It is normal in appearance. Survey of the adnexa demonstrates no adnexal masses. There is no free fluid in the cul de sac. Impression: 1. Thin endometrium. 2. There is a small amount of fluid throughout the endometrial canal. 3. Normal appearing ovaries. Recommendations: 1.Clinical correlation with the patient's History and Physical Exam. Gweneth Dimitri, RT Images reviewed.  Normal GYN study without visualized pathology.  Malachy Mood, MD, [facility], [facility] 10/17/2020, 3:11 PM     Assessment: 65 y.o. follow up PMB Plan: Problem List Items Addressed This Visit   None   Visit Diagnoses    Chronic vaginitis    -  Primary   Relevant Orders   NuSwab Vaginitis Plus (VG+)   Postcoital and contact bleeding       Relevant Orders   NuSwab Vaginitis Plus (VG+)   Routine screening for STI (sexually transmitted infection)       Relevant Orders   NuSwab Vaginitis Plus (VG+)      1) Vaginal spotting -  Normal [place] - Repeat NuSwab - 7 day course of doxycyline to cover possible cervicitis  2) A total of 15 minutes were spent in face-to-face contact with the patient during this encounter with over half of that time devoted to counseling and coordination of care.  3) No follow-ups on file.   Malachy Mood, MD, [facility], [facility] 10/17/2020, 3:26 PM

## 2020-10-21 LAB — NUSWAB VAGINITIS PLUS (VG+)
Candida albicans, NAA: NEGATIVE
Candida glabrata, NAA: NEGATIVE
Chlamydia trachomatis, NAA: NEGATIVE
Neisseria gonorrhoeae, NAA: NEGATIVE
Trich vag by NAA: NEGATIVE

## 2020-10-27 ENCOUNTER — Other Ambulatory Visit: Payer: Self-pay | Admitting: Internal Medicine

## 2020-11-03 ENCOUNTER — Ambulatory Visit: Payer: 59 | Admitting: Internal Medicine

## 2020-11-03 ENCOUNTER — Other Ambulatory Visit: Payer: Self-pay

## 2020-11-03 DIAGNOSIS — I10 Essential (primary) hypertension: Secondary | ICD-10-CM | POA: Diagnosis not present

## 2020-11-03 DIAGNOSIS — Z85528 Personal history of other malignant neoplasm of kidney: Secondary | ICD-10-CM

## 2020-11-03 DIAGNOSIS — I700 Atherosclerosis of aorta: Secondary | ICD-10-CM

## 2020-11-03 DIAGNOSIS — I471 Supraventricular tachycardia: Secondary | ICD-10-CM

## 2020-11-03 DIAGNOSIS — E7800 Pure hypercholesterolemia, unspecified: Secondary | ICD-10-CM

## 2020-11-03 DIAGNOSIS — K589 Irritable bowel syndrome without diarrhea: Secondary | ICD-10-CM

## 2020-11-03 DIAGNOSIS — F32A Depression, unspecified: Secondary | ICD-10-CM

## 2020-11-03 DIAGNOSIS — E1165 Type 2 diabetes mellitus with hyperglycemia: Secondary | ICD-10-CM | POA: Diagnosis not present

## 2020-11-03 DIAGNOSIS — E559 Vitamin D deficiency, unspecified: Secondary | ICD-10-CM

## 2020-11-03 DIAGNOSIS — D509 Iron deficiency anemia, unspecified: Secondary | ICD-10-CM

## 2020-11-03 DIAGNOSIS — R748 Abnormal levels of other serum enzymes: Secondary | ICD-10-CM

## 2020-11-03 DIAGNOSIS — M546 Pain in thoracic spine: Secondary | ICD-10-CM

## 2020-11-03 MED ORDER — TIZANIDINE HCL 2 MG PO TABS: 2.0000 mg | ORAL_TABLET | Freq: Every evening | ORAL | 0 refills | Status: DC | PRN

## 2020-11-03 NOTE — Progress Notes (Signed)
Patient ID: Jacqueline Nichols, female   DOB: [birth year], 65 y.o.   MRN: [id number]   Subjective:    Patient ID: Jacqueline Nichols, female    DOB: 1956-04-25, 65 y.o.   MRN: [id number]  HPI This visit occurred during the SARS-CoV-2 public health emergency.  Safety protocols were in place, including screening questions prior to the visit, additional usage of staff PPE, and extensive cleaning of exam room while observing appropriate contact time as indicated for disinfecting solutions.  Patient here for a scheduled follow up.  Here to follow up regarding her blood pressure, blood sugar and bowel issues.  Also has had issues with sleeping.  Has tapered off clonazepam.  Just has to take prn now.  Having issues with sleeping.  Taking hydroxyzine and this is not helping.  Having some back discomfort - flare.  Sitting/twisting - aggravates. Walking ok.  Localized to right side.  Previously saw chiropractor.  Discussed PT and stretches.  Discussed trial of muscle relaxer.  No chest pain or sob reported.  No abdominal pain or bowel change reported.  Seeing oncology for f/u renal cell carcinoma.  CT chest, abdomen and pelvis - no evidence of recurrent or metastatic disease within the chest, abdomen or pelvis.  Findings which may reflect esophageal reflux or dysmotility and aortic atherosclerosis.     Past Medical History:  Diagnosis Date  . Cancer ([place])    skin  . Chicken pox   . Colon polyps   . Depression   . Diabetes mellitus ([place])   . GERD (gastroesophageal reflux disease)   . Hyperlipidemia   . Hypertension   . 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 Urinary incontinence    Past Surgical History:  Procedure Laterality Date  . CHOLECYSTECTOMY  2001  . COLONOSCOPY WITH PROPOFOL N/A 03/27/2016   Procedure: COLONOSCOPY WITH PROPOFOL;  Surgeon: Lucilla Lame, MD;  Location: [facility] ENDOSCOPY;  Service: Endoscopy;  Laterality: N/A;  . KNEE SURGERY  2003-2009   s/p 8 knee surgeries (with 3 knee replacements)  .  NEPHRECTOMY RADICAL  07/2015  . NISSEN FUNDOPLICATION     Family History  Problem Relation Age of Onset  . Colon cancer Mother        died - age 48  . Heart disease Father   . Hypertension Father   . Lymphoma Father   . Arthritis/Rheumatoid Sister   . Lung cancer Brother        smoked  . Arthritis/Rheumatoid Paternal Grandmother   . Cancer Maternal Uncle        Unknown type of cancer   . Cancer Paternal Aunt        Unknown type of cancer   . Throat cancer Paternal Uncle   . Colon cancer Paternal Uncle   . Breast cancer Neg Hx    Social History   Socioeconomic History  . Marital status: Divorced    Spouse name: Not on file  . Number of children: 2  . Years of education: Not on file  . Highest education level: Not on file  Occupational History  . Not on file  Tobacco Use  . Smoking status: Former Smoker    Packs/day: 1.00    Years: 15.00    Pack years: 15.00    Types: Cigarettes    Quit date: 10/23/2012    Years since quitting: 8.0  . Smokeless tobacco: Never Used  . Tobacco comment: Quit Feb 2014  Vaping Use  . Vaping Use: Former  .  Start date: 02/20/2013  . Quit date: 02/20/2014  Substance and Sexual Activity  . Alcohol use: Yes    Alcohol/week: 1.0 standard drink    Types: 1 Glasses of wine per week    Comment: social drinking - approx 2-4 monthly   . Drug use: No  . Sexual activity: Not Currently  Other Topics Concern  . Not on file  Social History Narrative  . Not on file   Social Determinants of Health   Financial Resource Strain: Not on file  Food Insecurity: Not on file  Transportation Needs: Not on file  Physical Activity: Not on file  Stress: Not on file  Social Connections: Not on file    Outpatient Encounter Medications as of 11/03/2020  Medication Sig  . tiZANidine (ZANAFLEX) 2 MG tablet Take 1 tablet (2 mg total) by mouth at bedtime as needed for muscle spasms.  . bisoprolol-hydrochlorothiazide (ZIAC) 2.5-6.25 MG tablet TAKE 1 TABLET BY  MOUTH DAILY FOR HIGH BLOOD PRESSURE  . butalbital-acetaminophen-caffeine (FIORICET, ESGIC) 50-325-40 MG tablet TAKE ONE TABLET EACH DAY AS NEEDED FOR HEADACHE  . citalopram (CELEXA) 40 MG tablet TAKE 1 TABLET BY MOUTH DAILY  . clonazePAM (KLONOPIN) 0.5 MG tablet TAKE 1 TABLET BY MOUTH EVERY DAY AS NEEDED  . clopidogrel (PLAVIX) 75 MG tablet TAKE 1 TABLET BY MOUTH EVERY DAY  . diphenoxylate-atropine (LOMOTIL) 2.5-0.025 MG tablet TAKE 1 TABLET BY MOUTH 4 TIMES DAILY AS NEEDED FOR DIARRHEA OR LOOSE STOOLS  . Loperamide HCl (IMODIUM PO) Take by mouth as needed.  . meloxicam (MOBIC) 7.5 MG tablet TAKE ONE TABLET TWICE DAILY  . Misc Natural Products (ALLERGY RELEAF SYSTEM PO) Take by mouth. Reported on 03/20/2016 (Patient not taking: Reported on 09/29/2020)  . rosuvastatin (CRESTOR) 5 MG tablet TAKE 1 TABLET BY MOUTH DAILY  . torsemide (DEMADEX) 20 MG tablet TAKE 1 TABLET BY MOUTH EVERY DAY AS NEEDED.  . [DISCONTINUED] cyanocobalamin (,VITAMIN B-12,) 1000 MCG/ML injection INJECT INTO THE MUSCLE ONCE A WEEK FOR 4WEEKS AND THEN EVERY 30 DAYS (Patient not taking: Reported on 09/29/2020)  . [DISCONTINUED] doxycycline (VIBRAMYCIN) 100 MG capsule Take 1 capsule (100 mg total) by mouth 2 (two) times daily.  . [DISCONTINUED] hydrOXYzine (ATARAX/VISTARIL) 10 MG tablet TAKE 2 TABLETS BY MOUTH AT BEDTIME AS NEEDED  . [DISCONTINUED] metroNIDAZOLE (FLAGYL) 500 MG tablet Take 1 tablet (500 mg total) by mouth 2 (two) times daily. With food avoid alcohol  . [DISCONTINUED] nystatin (MYCOSTATIN/NYSTOP) powder Apply 1 application topically 2 (two) times daily.  . [DISCONTINUED] nystatin cream (MYCOSTATIN) Apply 1 application topically 2 (two) times daily.  . [DISCONTINUED] SYRINGE-NEEDLE, DISP, 3 ML 25G X 1" 3 ML MISC Use as directed with b12 injections (Patient not taking: Reported on 09/29/2020)  . [DISCONTINUED] Vitamin D, Ergocalciferol, (DRISDOL) 1.25 MG (50000 UNIT) CAPS capsule Take 1 capsule (50,000 Units total) by  mouth every 7 (seven) days. (Patient not taking: Reported on 09/29/2020)   No facility-administered encounter medications on file as of 11/03/2020.    Review of Systems  Constitutional: Negative for appetite change and unexpected weight change.  HENT: Negative for congestion.   Respiratory: Negative for cough, chest tightness and shortness of breath.   Cardiovascular: Negative for chest pain, palpitations and leg swelling.  Gastrointestinal: Negative for abdominal pain, diarrhea, nausea and vomiting.  Genitourinary: Negative for difficulty urinating and dysuria.  Musculoskeletal: Positive for back pain. Negative for joint swelling and myalgias.  Skin: Negative for color change and rash.  Neurological: Negative for dizziness, light-headedness and  headaches.  Psychiatric/Behavioral: Negative for agitation and dysphoric mood.       Objective:    Physical Exam Vitals reviewed.  Constitutional:      General: She is not in acute distress.    Appearance: Normal appearance.  HENT:     Head: Normocephalic and atraumatic.     Right Ear: External ear normal.     Left Ear: External ear normal.     Mouth/Throat:     Mouth: Oropharynx is clear and moist.  Eyes:     General: No scleral icterus.       Right eye: No discharge.        Left eye: No discharge.     Conjunctiva/sclera: Conjunctivae normal.  Neck:     Thyroid: No thyromegaly.  Cardiovascular:     Rate and Rhythm: Normal rate and regular rhythm.  Pulmonary:     Effort: No respiratory distress.     Breath sounds: Normal breath sounds. No wheezing.  Abdominal:     General: Bowel sounds are normal.     Palpations: Abdomen is soft.     Tenderness: There is no abdominal tenderness.  Musculoskeletal:        General: No swelling, tenderness or edema.     Cervical back: Neck supple. No tenderness.     Comments: Increased pain right lateral back - with twisting.  No pain with flexion and extension at hip.    Lymphadenopathy:      Cervical: No cervical adenopathy.  Skin:    Findings: No erythema or rash.  Neurological:     General: No focal deficit present.     Mental Status: She is alert.  Psychiatric:        Mood and Affect: Mood normal.        Behavior: Behavior normal.     BP 110/62   Pulse 65   Temp 98.6 F (37 C) (Oral)   Resp 16   Ht '5\' 10"'  (1.778 m)   Wt 229 lb (103.9 kg)   SpO2 98%   BMI 32.86 kg/m  Wt Readings from Last 3 Encounters:  11/03/20 229 lb (103.9 kg)  10/17/20 228 lb (103.4 kg)  10/04/20 232 lb (105.2 kg)     Lab Results  Component Value Date   WBC 5.4 09/25/2020   HGB 13.0 09/25/2020   HCT 38.2 09/25/2020   PLT 188 09/25/2020   GLUCOSE 94 09/25/2020   CHOL 138 07/07/2020   TRIG 79 07/07/2020   HDL 46 07/07/2020   LDLCALC 77 07/07/2020   ALT 24 09/25/2020   AST 27 09/25/2020   NA 137 09/25/2020   K 4.4 09/25/2020   CL 103 09/25/2020   CREATININE 0.96 09/25/2020   BUN 27 (H) 09/25/2020   CO2 27 09/25/2020   TSH 2.390 01/28/2020   HGBA1C 5.2 07/07/2020      Assessment & Plan:   Problem List Items Addressed This Visit    Anemia    Follow cbc.       Aortic atherosclerosis (HCC)    Continue crestor.        Back pain    Aggravated by certain movements and twisting.  Trial of tizanidine.  Follow.  Notify me if persistent.  Recent CT scan as outlined.  Appears to be more msk in origin.       Relevant Medications   tiZANidine (ZANAFLEX) 2 MG tablet   Depression    Continue celexa.  Has clonazepam to take prn.  Hydroxyzine  not helping with sleep.  Trial of muscle relaxer as outlined given back issues.  Follow.       Diabetes mellitus ([place])    Low carb diet and exercise.  Follow met b and a1c.       Relevant Orders   Hemoglobin G1C   Basic metabolic panel   Microalbumin / creatinine urine ratio   Elevated alkaline phosphatase level    Last liver panel wnl.       Hypercholesteremia    Continue crestor. Low cholesterol diet and exercise.  Follow  lipid panel and liver function tests.        Relevant Orders   Hepatic function panel   Lipid panel   Hypertension    Continue on ziac and torsemide.  Blood pressure doing well.  Follow pressures.  Follow metabolic panel.       Relevant Orders   TSH   IBS (irritable bowel syndrome)    Symptoms stable.       Personal history of renal cell carcinoma    S/p left radical nephrectomy and partial adrenalectomy.  Seeing Dr Tasia Catchings.  CT as outlined.  Continue f/u with oncology.  Follow renal function.       SVT (supraventricular tachycardia) (HCC)    Stable.  On ziac.  Follow.       Vitamin D deficiency    Continue vitamin D supplements.       Relevant Orders   VITAMIN D 25 Hydroxy (Vit-D Deficiency, Fractures)       Einar Pheasant, MD

## 2020-11-05 ENCOUNTER — Encounter: Payer: Self-pay | Admitting: Internal Medicine

## 2020-11-05 DIAGNOSIS — M549 Dorsalgia, unspecified: Secondary | ICD-10-CM | POA: Insufficient documentation

## 2020-11-05 DIAGNOSIS — I700 Atherosclerosis of aorta: Secondary | ICD-10-CM | POA: Insufficient documentation

## 2020-11-05 NOTE — Assessment & Plan Note (Signed)
Low carb diet and exercise.  Follow met b and a1c.  

## 2020-11-05 NOTE — Assessment & Plan Note (Signed)
Stable.  On ziac.  Follow.

## 2020-11-05 NOTE — Assessment & Plan Note (Signed)
Continue on ziac and torsemide.  Blood pressure doing well.  Follow pressures.  Follow metabolic panel.

## 2020-11-05 NOTE — Assessment & Plan Note (Signed)
Aggravated by certain movements and twisting.  Trial of tizanidine.  Follow.  Notify me if persistent.  Recent CT scan as outlined.  Appears to be more msk in origin.

## 2020-11-05 NOTE — Assessment & Plan Note (Signed)
Continue crestor.  Low cholesterol diet and exercise. Follow lipid panel and liver function tests.   

## 2020-11-05 NOTE — Assessment & Plan Note (Signed)
Continue vitamin D supplements.  

## 2020-11-05 NOTE — Assessment & Plan Note (Signed)
Last liver panel wnl.

## 2020-11-05 NOTE — Assessment & Plan Note (Signed)
Continue celexa.  Has clonazepam to take prn.  Hydroxyzine not helping with sleep.  Trial of muscle relaxer as outlined given back issues.  Follow.

## 2020-11-05 NOTE — Assessment & Plan Note (Signed)
Symptoms stable

## 2020-11-05 NOTE — Assessment & Plan Note (Signed)
Follow cbc.  

## 2020-11-05 NOTE — Assessment & Plan Note (Signed)
S/p left radical nephrectomy and partial adrenalectomy.  Seeing Dr Tasia Catchings.  CT as outlined.  Continue f/u with oncology.  Follow renal function.

## 2020-11-05 NOTE — Assessment & Plan Note (Signed)
Continue crestor 

## 2020-11-11 ENCOUNTER — Other Ambulatory Visit: Payer: Self-pay | Admitting: Internal Medicine

## 2020-11-14 NOTE — Telephone Encounter (Signed)
rx ok's for citalopram #90 with one refill.

## 2020-11-29 ENCOUNTER — Telehealth: Payer: Self-pay | Admitting: Internal Medicine

## 2020-11-29 NOTE — Telephone Encounter (Signed)
LMTCB

## 2020-11-29 NOTE — Telephone Encounter (Signed)
Pt called she said that the tiZANidine (ZANAFLEX) 2 MG tablet is not helping and wanted to see if something else could be called in  Also she said that a cream antibiotic was supposed to be called in

## 2020-12-05 NOTE — Telephone Encounter (Signed)
LMTCB

## 2020-12-20 ENCOUNTER — Other Ambulatory Visit: Payer: Self-pay

## 2020-12-20 ENCOUNTER — Telehealth: Payer: Self-pay

## 2020-12-20 MED ORDER — NYSTATIN 100000 UNIT/GM EX CREA: 1.0000 "application " | TOPICAL_CREAM | Freq: Two times a day (BID) | CUTANEOUS | 0 refills | Status: AC

## 2020-12-20 NOTE — Telephone Encounter (Signed)
Patient is aware [facility] is out of the office this week. I attempt to schedule with another provider so she could get the prescription sent in. Patient states she didn't want to see another provider. Patient requesting nystatin cream (MYCOSTATIN) to be sent in

## 2020-12-20 NOTE — Telephone Encounter (Signed)
I refilled her nystatin cream to use prn. She says the tizanidine has  not been working and would like to try taking 4 mg qhs and see if this helps.

## 2020-12-20 NOTE — Telephone Encounter (Signed)
Pt calling triage requesting refill on medication for vaginal d/c from [facility]. Can you please schedule her an appt since [facility] not here this week

## 2020-12-20 NOTE — Telephone Encounter (Signed)
I am ok for her to try the 4mg  of tizanidine.  If agreeable, can send in rx for tizanidine 4mg  q hs prn #30 with one refill.  I can send in if needed.

## 2020-12-21 ENCOUNTER — Other Ambulatory Visit: Payer: Self-pay

## 2020-12-21 MED ORDER — TIZANIDINE HCL 4 MG PO TABS: 4.0000 mg | ORAL_TABLET | Freq: Every evening | ORAL | 1 refills | Status: DC | PRN

## 2020-12-21 NOTE — Telephone Encounter (Signed)
I have sent in 4 mg tizanidine #30 with 1 refill. Patient is aware.

## 2020-12-22 ENCOUNTER — Encounter: Payer: Self-pay | Admitting: Obstetrics and Gynecology

## 2020-12-22 ENCOUNTER — Ambulatory Visit: Payer: 59 | Admitting: Obstetrics and Gynecology

## 2020-12-22 ENCOUNTER — Other Ambulatory Visit: Payer: Self-pay

## 2020-12-22 VITALS — BP 138/82 | Wt 234.0 lb

## 2020-12-22 DIAGNOSIS — Z113 Encounter for screening for infections with a predominantly sexual mode of transmission: Secondary | ICD-10-CM | POA: Diagnosis not present

## 2020-12-22 DIAGNOSIS — B9689 Other specified bacterial agents as the cause of diseases classified elsewhere: Secondary | ICD-10-CM

## 2020-12-22 DIAGNOSIS — N898 Other specified noninflammatory disorders of vagina: Secondary | ICD-10-CM

## 2020-12-22 DIAGNOSIS — N760 Acute vaginitis: Secondary | ICD-10-CM | POA: Diagnosis not present

## 2020-12-22 LAB — POCT WET PREP (WET MOUNT): Clue Cells Wet Prep Whiff POC: NEGATIVE

## 2020-12-22 MED ORDER — METRONIDAZOLE 0.75 % VA GEL: 1.0000 | Freq: Every day | VAGINAL | 0 refills | Status: AC

## 2020-12-22 NOTE — Progress Notes (Signed)
Patient ID: Jacqueline Nichols, female   DOB: 10-22-1955, 65 y.o.   MRN: [id number]  Reason for Visit: Vaginal Discharge (Yellowish/green discharge no odor)    Subjective:     HPI:  Jacqueline Nichols is a 65 y.o. female who presents with concern for vaginal discharge which she has been experiencing for several days. Patient reports this is consistent with the discharge she first experienced in January. Patient reports a yellow to greenish discharge without any associated odor. She denies symptoms of irritation or itching. She previously had reports postcoital spotting but has not associated that with the symptom of vaginal discharge. She reports that her discharge had previously been treated with metronidazole PO with a cessation of symptoms. She denies urinary symptoms today.     Past Medical History:  Diagnosis Date  . Cancer ([place])    skin  . Chicken pox   . Colon polyps   . Depression   . Diabetes mellitus ([place])   . GERD (gastroesophageal reflux disease)   . Hyperlipidemia   . Hypertension   . 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 Urinary incontinence    Family History  Problem Relation Age of Onset  . Colon cancer Mother        died - age 23  . Heart disease Father   . Hypertension Father   . Lymphoma Father   . Arthritis/Rheumatoid Sister   . Lung cancer Brother        smoked  . Arthritis/Rheumatoid Paternal Grandmother   . Cancer Maternal Uncle        Unknown type of cancer   . Cancer Paternal Aunt        Unknown type of cancer   . Throat cancer Paternal Uncle   . Colon cancer Paternal Uncle   . Breast cancer Neg Hx    Past Surgical History:  Procedure Laterality Date  . CHOLECYSTECTOMY  2001  . COLONOSCOPY WITH PROPOFOL N/A 03/27/2016   Procedure: COLONOSCOPY WITH PROPOFOL;  Surgeon: Lucilla Lame, MD;  Location: [facility] ENDOSCOPY;  Service: Endoscopy;  Laterality: N/A;  . KNEE SURGERY  2003-2009   s/p 8 knee surgeries (with 3 knee replacements)  . NEPHRECTOMY RADICAL   07/2015  . NISSEN FUNDOPLICATION      Short Social History:  Social History   Tobacco Use  . Smoking status: Former Smoker    Packs/day: 1.00    Years: 15.00    Pack years: 15.00    Types: Cigarettes    Quit date: 10/23/2012    Years since quitting: 8.1  . Smokeless tobacco: Never Used  . Tobacco comment: Quit Feb 2014  Substance Use Topics  . Alcohol use: Yes    Alcohol/week: 1.0 standard drink    Types: 1 Glasses of wine per week    Comment: social drinking - approx 2-4 monthly     Allergies  Allergen Reactions  . Codeine Nausea Only  . Vicodin [Hydrocodone-Acetaminophen] Itching    Current Outpatient Medications  Medication Sig Dispense Refill  . metroNIDAZOLE (METROGEL) 0.75 % vaginal gel Place 1 Applicatorful vaginally at bedtime for 5 days. 50 g 0  . bisoprolol-hydrochlorothiazide (ZIAC) 2.5-6.25 MG tablet TAKE 1 TABLET BY MOUTH DAILY FOR HIGH BLOOD PRESSURE 90 tablet 1  . butalbital-acetaminophen-caffeine (FIORICET, ESGIC) 50-325-40 MG tablet TAKE ONE TABLET EACH DAY AS NEEDED FOR HEADACHE 15 tablet 0  . citalopram (CELEXA) 40 MG tablet TAKE 1 TABLET BY MOUTH DAILY 90 tablet 1  . clonazePAM (KLONOPIN) 0.5  MG tablet TAKE 1 TABLET BY MOUTH EVERY DAY AS NEEDED 30 tablet 1  . clopidogrel (PLAVIX) 75 MG tablet TAKE 1 TABLET BY MOUTH EVERY DAY 90 tablet 1  . diphenoxylate-atropine (LOMOTIL) 2.5-0.025 MG tablet TAKE 1 TABLET BY MOUTH 4 TIMES DAILY AS NEEDED FOR DIARRHEA OR LOOSE STOOLS 120 tablet 5  . Loperamide HCl (IMODIUM PO) Take by mouth as needed.    . meloxicam (MOBIC) 7.5 MG tablet TAKE ONE TABLET TWICE DAILY 60 tablet 5  . Misc Natural Products (ALLERGY RELEAF SYSTEM PO) Take by mouth. Reported on 03/20/2016 (Patient not taking: Reported on 09/29/2020)    . nystatin cream (MYCOSTATIN) Apply 1 application topically 2 (two) times daily. 30 g 0  . rosuvastatin (CRESTOR) 5 MG tablet TAKE 1 TABLET BY MOUTH DAILY 90 tablet 1  . tiZANidine (ZANAFLEX) 4 MG tablet Take 1  tablet (4 mg total) by mouth at bedtime as needed for muscle spasms. 30 tablet 1  . torsemide (DEMADEX) 20 MG tablet TAKE 1 TABLET BY MOUTH EVERY DAY AS NEEDED. 90 tablet 1   No current facility-administered medications for this visit.    Review of Systems  All other systems reviewed and are negative       Objective:  Objective   Vitals:   12/22/20 1603  BP: 138/82  Weight: 234 lb (106.1 kg)   Body mass index is 33.58 kg/m.  Physical Exam Constitutional:      Appearance: Normal appearance.  Pulmonary:     Effort: Pulmonary effort is normal.  Genitourinary:    Comments: External: Normal appearing vulva. No lesions noted.  Speculum examination: Normal appearing cervix, friable with manipulation. No blood in the vaginal vault. Scant amount of gray discharge.    Neurological:     Mental Status: She is alert.    Microscopic wet-mount exam shows a few clue cells, negative whiff.    Assessment/Plan:     65 yo presents with concern for vaginal discharge. Scant clue cells noted on wet mount. NuSwab obtained. Will presumptively treat for BV and follow-up with lab results. Patient opts for vaginal treatment vs oral administration.  Problem List Items Addressed This Visit   None   Visit Diagnoses    Vaginal discharge    -  Primary   Relevant Orders   NuSwab Vaginitis Plus (VG+)   POCT Wet Prep [facility]) (Completed)   Screen for sexually transmitted diseases       Relevant Orders   NuSwab Vaginitis Plus (VG+)   Bacterial vaginosis       Relevant Medications   metroNIDAZOLE (METROGEL) 0.75 % vaginal gel        Orlie Pollen, [facility], [facility] 12/22/2020 4:43 PM

## 2020-12-25 LAB — NUSWAB VAGINITIS PLUS (VG+)
Candida albicans, NAA: NEGATIVE
Candida glabrata, NAA: NEGATIVE
Chlamydia trachomatis, NAA: NEGATIVE
Neisseria gonorrhoeae, NAA: NEGATIVE
Trich vag by NAA: NEGATIVE

## 2021-01-03 ENCOUNTER — Ambulatory Visit: Payer: 59 | Admitting: Internal Medicine

## 2021-01-03 ENCOUNTER — Other Ambulatory Visit: Payer: Self-pay

## 2021-01-03 ENCOUNTER — Ambulatory Visit (INDEPENDENT_AMBULATORY_CARE_PROVIDER_SITE_OTHER): Payer: 59

## 2021-01-03 ENCOUNTER — Encounter: Payer: Self-pay | Admitting: Internal Medicine

## 2021-01-03 VITALS — BP 134/70 | HR 58 | Temp 98.0°F | Resp 16 | Ht 70.0 in | Wt 230.4 lb

## 2021-01-03 DIAGNOSIS — E7800 Pure hypercholesterolemia, unspecified: Secondary | ICD-10-CM

## 2021-01-03 DIAGNOSIS — F32A Depression, unspecified: Secondary | ICD-10-CM

## 2021-01-03 DIAGNOSIS — I10 Essential (primary) hypertension: Secondary | ICD-10-CM | POA: Diagnosis not present

## 2021-01-03 DIAGNOSIS — M546 Pain in thoracic spine: Secondary | ICD-10-CM | POA: Diagnosis not present

## 2021-01-03 DIAGNOSIS — E1165 Type 2 diabetes mellitus with hyperglycemia: Secondary | ICD-10-CM

## 2021-01-03 DIAGNOSIS — R0781 Pleurodynia: Secondary | ICD-10-CM | POA: Insufficient documentation

## 2021-01-03 DIAGNOSIS — I700 Atherosclerosis of aorta: Secondary | ICD-10-CM

## 2021-01-03 DIAGNOSIS — Z85528 Personal history of other malignant neoplasm of kidney: Secondary | ICD-10-CM

## 2021-01-03 DIAGNOSIS — I471 Supraventricular tachycardia: Secondary | ICD-10-CM | POA: Diagnosis not present

## 2021-01-03 NOTE — Progress Notes (Signed)
Patient ID: Jacqueline Nichols, female   DOB: 1956/03/11, 65 y.o.   MRN: [id number]   Subjective:    Patient ID: Jacqueline Nichols, female    DOB: 1955/11/07, 65 y.o.   MRN: [id number]  HPI This visit occurred during the SARS-CoV-2 public health emergency.  Safety protocols were in place, including screening questions prior to the visit, additional usage of staff PPE, and extensive cleaning of exam room while observing appropriate contact time as indicated for disinfecting solutions.  Patient here for work in appt. Work in to discuss increased back pain.  Reports persistent right side back pain.  Worsened recently.  No known injury or trauma.  No chest pain or sob reported.  No abdominal pain.  Bowels moving.  No rash.  She is concerned given history of renal cell CA.  Has been using a heating pad and trying to relax the area - does help.  No urine change.  No cough or congestion.   Past Medical History:  Diagnosis Date  . Cancer ([place])    skin  . Chicken pox   . Colon polyps   . Depression   . Diabetes mellitus ([place])   . GERD (gastroesophageal reflux disease)   . Hyperlipidemia   . Hypertension   . 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 Urinary incontinence    Past Surgical History:  Procedure Laterality Date  . CHOLECYSTECTOMY  2001  . COLONOSCOPY WITH PROPOFOL N/A 03/27/2016   Procedure: COLONOSCOPY WITH PROPOFOL;  Surgeon: Lucilla Lame, MD;  Location: [facility] ENDOSCOPY;  Service: Endoscopy;  Laterality: N/A;  . KNEE SURGERY  2003-2009   s/p 8 knee surgeries (with 3 knee replacements)  . NEPHRECTOMY RADICAL  07/2015  . NISSEN FUNDOPLICATION     Family History  Problem Relation Age of Onset  . Colon cancer Mother        died - age 2  . Heart disease Father   . Hypertension Father   . Lymphoma Father   . Arthritis/Rheumatoid Sister   . Lung cancer Brother        smoked  . Arthritis/Rheumatoid Paternal Grandmother   . Cancer Maternal Uncle        Unknown type of cancer   . Cancer Paternal  Aunt        Unknown type of cancer   . Throat cancer Paternal Uncle   . Colon cancer Paternal Uncle   . Breast cancer Neg Hx    Social History   Socioeconomic History  . Marital status: Divorced    Spouse name: Not on file  . Number of children: 2  . Years of education: Not on file  . Highest education level: Not on file  Occupational History  . Not on file  Tobacco Use  . Smoking status: Former Smoker    Packs/day: 1.00    Years: 15.00    Pack years: 15.00    Types: Cigarettes    Quit date: 10/23/2012    Years since quitting: 8.2  . Smokeless tobacco: Never Used  . Tobacco comment: Quit Feb 2014  Vaping Use  . Vaping Use: Former  . Start date: 02/20/2013  . Quit date: 02/20/2014  Substance and Sexual Activity  . Alcohol use: Yes    Alcohol/week: 1.0 standard drink    Types: 1 Glasses of wine per week    Comment: social drinking - approx 2-4 monthly   . Drug use: No  . Sexual activity: Not Currently  Other Topics Concern  .  Not on file  Social History Narrative  . Not on file   Social Determinants of Health   Financial Resource Strain: Not on file  Food Insecurity: Not on file  Transportation Needs: Not on file  Physical Activity: Not on file  Stress: Not on file  Social Connections: Not on file    Outpatient Encounter Medications as of 01/03/2021  Medication Sig  . bisoprolol-hydrochlorothiazide (ZIAC) 2.5-6.25 MG tablet TAKE 1 TABLET BY MOUTH DAILY FOR HIGH BLOOD PRESSURE  . butalbital-acetaminophen-caffeine (FIORICET, ESGIC) 50-325-40 MG tablet TAKE ONE TABLET EACH DAY AS NEEDED FOR HEADACHE  . citalopram (CELEXA) 40 MG tablet TAKE 1 TABLET BY MOUTH DAILY  . clonazePAM (KLONOPIN) 0.5 MG tablet TAKE 1 TABLET BY MOUTH EVERY DAY AS NEEDED  . clopidogrel (PLAVIX) 75 MG tablet TAKE 1 TABLET BY MOUTH EVERY DAY  . diphenoxylate-atropine (LOMOTIL) 2.5-0.025 MG tablet TAKE 1 TABLET BY MOUTH 4 TIMES DAILY AS NEEDED FOR DIARRHEA OR LOOSE STOOLS  . Loperamide HCl  (IMODIUM PO) Take by mouth as needed.  . meloxicam (MOBIC) 7.5 MG tablet TAKE ONE TABLET TWICE DAILY  . Misc Natural Products (ALLERGY RELEAF SYSTEM PO) Take by mouth. Reported on 03/20/2016  . nystatin cream (MYCOSTATIN) Apply 1 application topically 2 (two) times daily.  . rosuvastatin (CRESTOR) 5 MG tablet TAKE 1 TABLET BY MOUTH DAILY  . tiZANidine (ZANAFLEX) 4 MG tablet Take 1 tablet (4 mg total) by mouth at bedtime as needed for muscle spasms.  Marland Kitchen torsemide (DEMADEX) 20 MG tablet TAKE 1 TABLET BY MOUTH EVERY DAY AS NEEDED.   No facility-administered encounter medications on file as of 01/03/2021.    Review of Systems  Constitutional: Negative for appetite change and unexpected weight change.  HENT: Negative for congestion and sinus pressure.   Respiratory: Negative for cough, chest tightness and shortness of breath.   Cardiovascular: Negative for chest pain, palpitations and leg swelling.  Gastrointestinal: Negative for abdominal pain, diarrhea, nausea and vomiting.  Genitourinary: Negative for difficulty urinating and dysuria.  Musculoskeletal: Positive for back pain. Negative for joint swelling and myalgias.  Skin: Negative for color change and rash.  Neurological: Negative for dizziness, light-headedness and headaches.  Psychiatric/Behavioral: Negative for agitation and dysphoric mood.       Objective:    Physical Exam Vitals reviewed.  Constitutional:      General: She is not in acute distress.    Appearance: Normal appearance.  HENT:     Head: Normocephalic and atraumatic.     Right Ear: External ear normal.     Left Ear: External ear normal.  Eyes:     General: No scleral icterus.       Right eye: No discharge.        Left eye: No discharge.     Conjunctiva/sclera: Conjunctivae normal.  Neck:     Thyroid: No thyromegaly.  Cardiovascular:     Rate and Rhythm: Normal rate and regular rhythm.  Pulmonary:     Effort: No respiratory distress.     Breath sounds: Normal  breath sounds. No wheezing.  Abdominal:     General: Bowel sounds are normal.     Palpations: Abdomen is soft.     Tenderness: There is no abdominal tenderness.  Musculoskeletal:        General: No swelling.     Cervical back: Neck supple. No tenderness.     Comments: Tenderness to palpation - right back.  Some increased pain with twisting at the waist.   Lymphadenopathy:  Cervical: No cervical adenopathy.  Skin:    Findings: No erythema or rash.  Neurological:     Mental Status: She is alert.  Psychiatric:        Mood and Affect: Mood normal.        Behavior: Behavior normal.     BP 134/70   Pulse (!) 58   Temp 98 F (36.7 C) (Oral)   Resp 16   Ht '5\' 10"'  (1.778 m)   Wt 230 lb 6.4 oz (104.5 kg)   SpO2 97%   BMI 33.06 kg/m  Wt Readings from Last 3 Encounters:  01/03/21 230 lb 6.4 oz (104.5 kg)  12/22/20 234 lb (106.1 kg)  11/03/20 229 lb (103.9 kg)     Lab Results  Component Value Date   WBC 5.4 09/25/2020   HGB 13.0 09/25/2020   HCT 38.2 09/25/2020   PLT 188 09/25/2020   GLUCOSE 94 09/25/2020   CHOL 138 07/07/2020   TRIG 79 07/07/2020   HDL 46 07/07/2020   LDLCALC 77 07/07/2020   ALT 24 09/25/2020   AST 27 09/25/2020   NA 137 09/25/2020   K 4.4 09/25/2020   CL 103 09/25/2020   CREATININE 0.96 09/25/2020   BUN 27 (H) 09/25/2020   CO2 27 09/25/2020   TSH 2.390 01/28/2020   HGBA1C 5.2 07/07/2020       Assessment & Plan:   Problem List Items Addressed This Visit    Aortic atherosclerosis ([place])    Continue crestor.       Back pain - Primary    Aggravated by certain movements.  Reproducible on exam.  Worsened recently.  Heating pad helps.  Just had CT chest, abdomen and pelvis.  No acute abnormality seen.  Will check xray of thoracic spine and rib to confirm no bony abnormality.  Tylenol.  Already on mobic.  Discussed.  Continue tizanidine.       Relevant Orders   DG Thoracic Spine 2 View (Completed)   Depression    Continues on celexa.  Tapering clonazepam. Some increased stress related to her current pain.  W/up as outlined.  Follow.       Diabetes mellitus ([place])    Low carb diet and exercise given blood sugar elevated.  Follow met b and a1c.       Hypercholesteremia    Continue crestor.        Hypertension    Continue on torsemide and ziac.  Blood pressure as outlined - doing well.  Follow pressures.  Follow metabolic panel.       Personal history of renal cell carcinoma    S/p left radical nephrectomy and partial adrenalectomy.  Followed by Dr Tasia Catchings.  CT as outlined.        Rib pain on right side    Posterior rib pain as outlined.  Worsened recently.  Reproducible on exam.  Aggravated by twisting and palpation. Check xray.  Further w/up pending results.       Relevant Orders   DG Ribs Unilateral Right (Completed)   SVT (supraventricular tachycardia) (HCC)    Stable on ziac.           Einar Pheasant, MD

## 2021-01-05 ENCOUNTER — Telehealth: Payer: Self-pay | Admitting: Internal Medicine

## 2021-01-05 NOTE — Telephone Encounter (Signed)
Radiology called stated X-ray needs to read right rib and chest

## 2021-01-07 ENCOUNTER — Encounter: Payer: Self-pay | Admitting: Internal Medicine

## 2021-01-08 ENCOUNTER — Encounter: Payer: Self-pay | Admitting: Internal Medicine

## 2021-01-08 ENCOUNTER — Telehealth: Payer: Self-pay

## 2021-01-08 NOTE — Assessment & Plan Note (Signed)
Aggravated by certain movements.  Reproducible on exam.  Worsened recently.  Heating pad helps.  Just had CT chest, abdomen and pelvis.  No acute abnormality seen.  Will check xray of thoracic spine and rib to confirm no bony abnormality.  Tylenol.  Already on mobic.  Discussed.  Continue tizanidine.

## 2021-01-08 NOTE — Assessment & Plan Note (Signed)
Continue crestor 

## 2021-01-08 NOTE — Assessment & Plan Note (Addendum)
Low carb diet and exercise given blood sugar elevated.  Follow met b and a1c.  

## 2021-01-08 NOTE — Telephone Encounter (Signed)
Patient needs a copy of the most current FMLA paperwork. Would like you to email her a copy of the form. Patient said you sent it in the beginning of April. Company now says they didn't receive it.. Please advise the patient.

## 2021-01-08 NOTE — Assessment & Plan Note (Signed)
S/p left radical nephrectomy and partial adrenalectomy.  Followed by Dr Tasia Catchings.  CT as outlined.

## 2021-01-08 NOTE — Assessment & Plan Note (Signed)
Continue on torsemide and ziac.  Blood pressure as outlined - doing well.  Follow pressures.  Follow metabolic panel.  

## 2021-01-08 NOTE — Assessment & Plan Note (Signed)
Posterior rib pain as outlined.  Worsened recently.  Reproducible on exam.  Aggravated by twisting and palpation. Check xray.  Further w/up pending results.

## 2021-01-08 NOTE — Assessment & Plan Note (Signed)
Stable on ziac.  

## 2021-01-08 NOTE — Telephone Encounter (Signed)
FMLA paper work has been refaxed to [employer].

## 2021-01-08 NOTE — Assessment & Plan Note (Signed)
Continues on celexa. Tapering clonazepam. Some increased stress related to her current pain.  W/up as outlined.  Follow.

## 2021-01-22 IMAGING — CT CT ABD-PELV W/O CM
2 of 4 series · 14 of 46 positions shown, 16 images · non-contrast
Comparison: CT chest abdomen pelvis September 20, 2019

CLINICAL DATA: History of renal cell cancer status post
nephrectomy.

EXAM:
CT CHEST, ABDOMEN AND PELVIS WITHOUT CONTRAST
TECHNIQUE: Multidetector CT imaging of the chest, abdomen and pelvis was
performed following the standard protocol without IV contrast.

[Series 2: axials cap 5.00 · axial · 0.94mm/px · z∈[-1532,-942]mm · 11 of 140 slices shown, 13 images]
[im 11/140  soft-tissue]
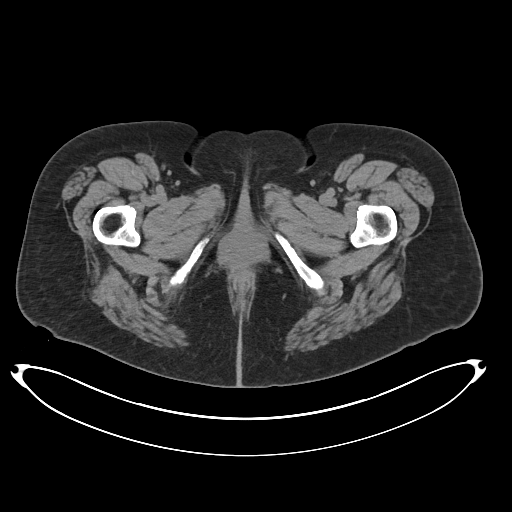
[im 11/140  bone]
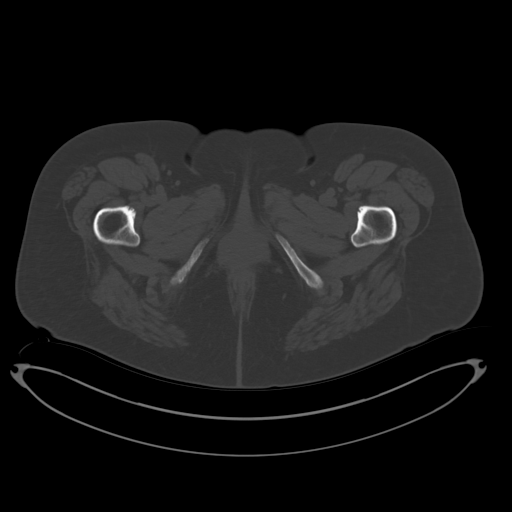
[im 22/140  soft-tissue]
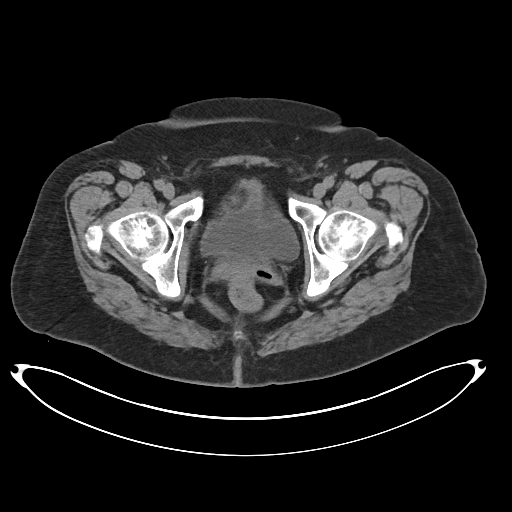
[im 33/140  soft-tissue]
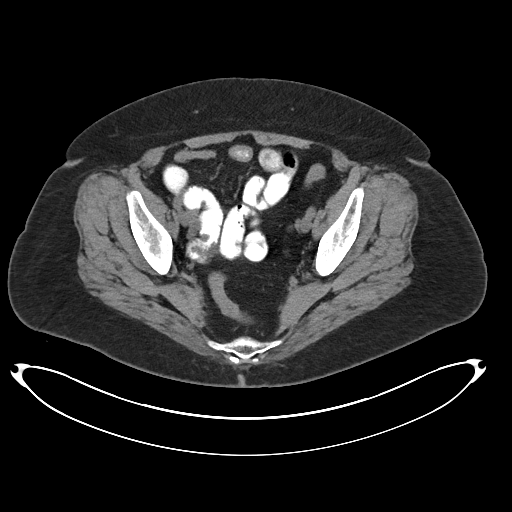
[im 43/140  soft-tissue]
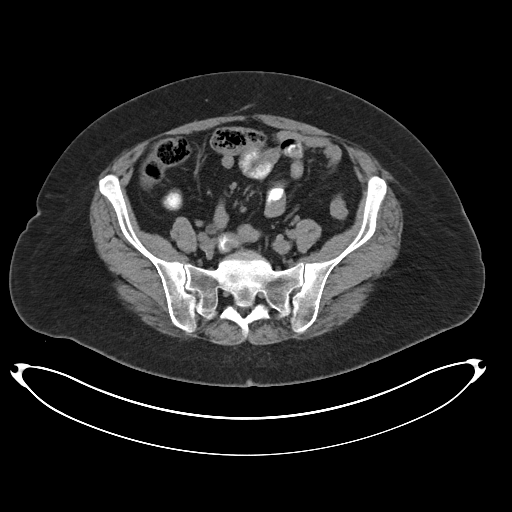
[im 54/140  soft-tissue]
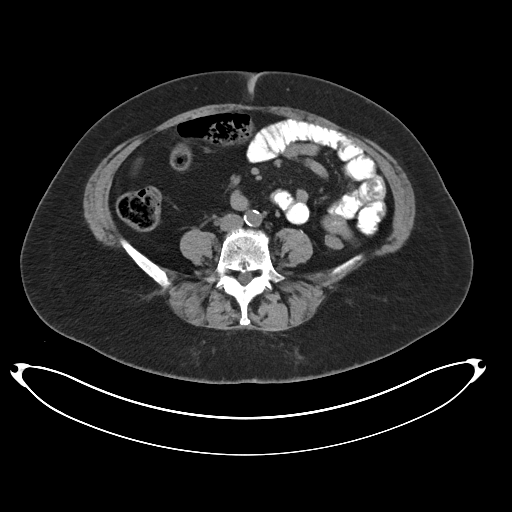
[im 75/140  soft-tissue]
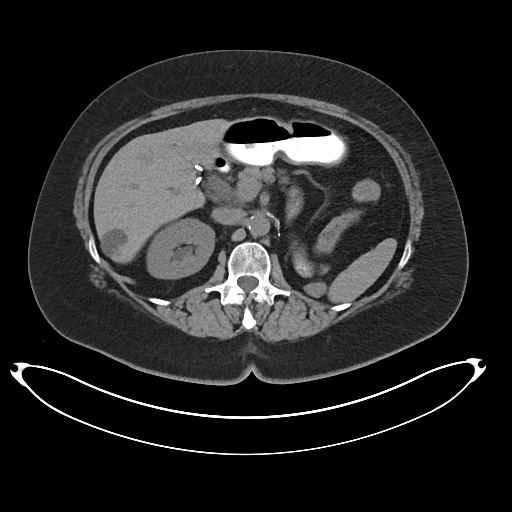
[im 86/140  soft-tissue]
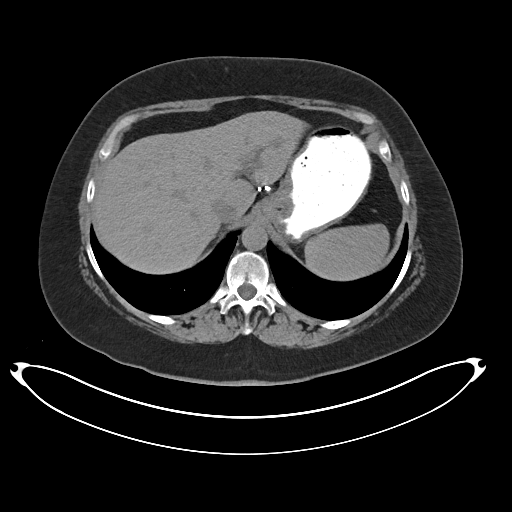
[im 97/140  soft-tissue]
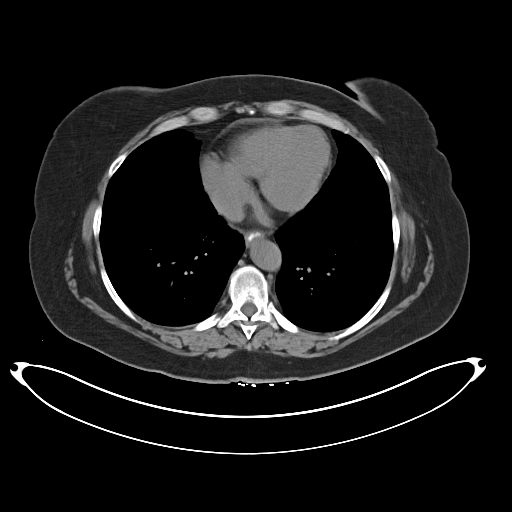
[im 107/140  soft-tissue]
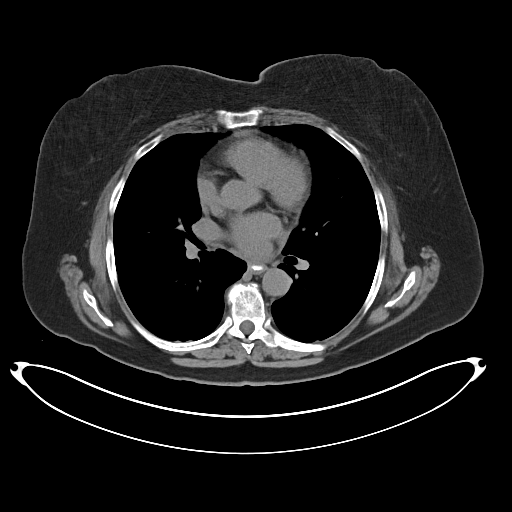
[im 107/140  bone]
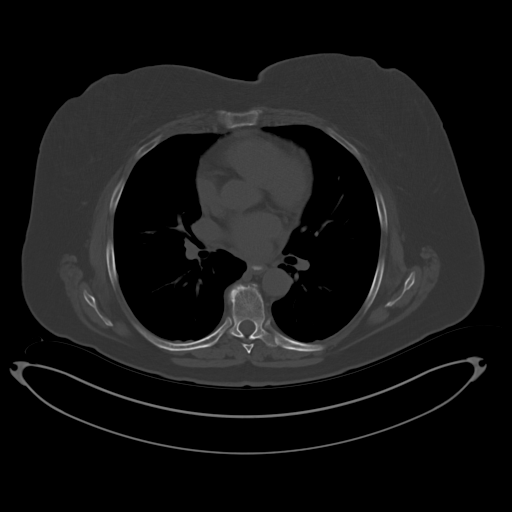
[im 118/140  soft-tissue]
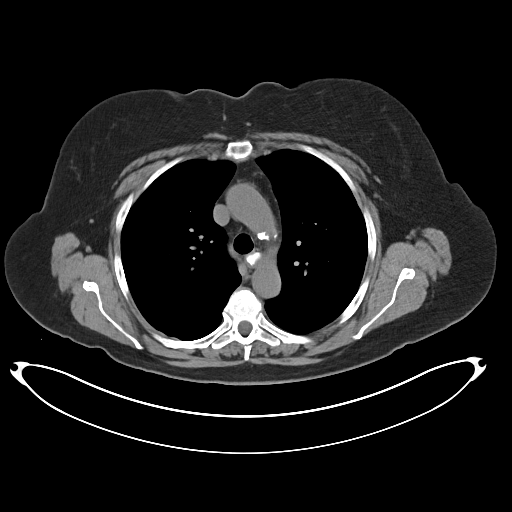
[im 129/140  soft-tissue]
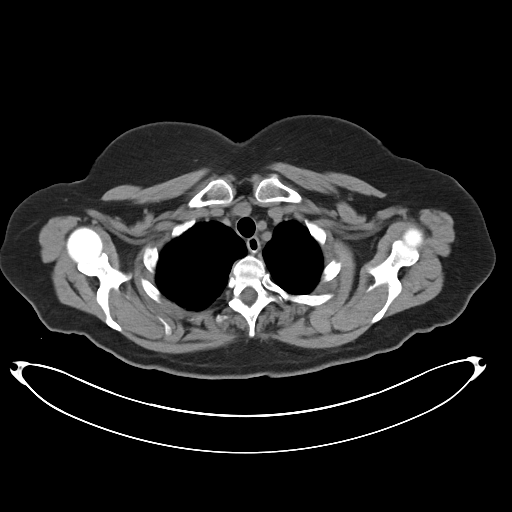

[Series 4: coronals cap 2.00 cor · coronal · 0.94mm/px · 3 of 162 slices shown]
[im 54/162  soft-tissue]
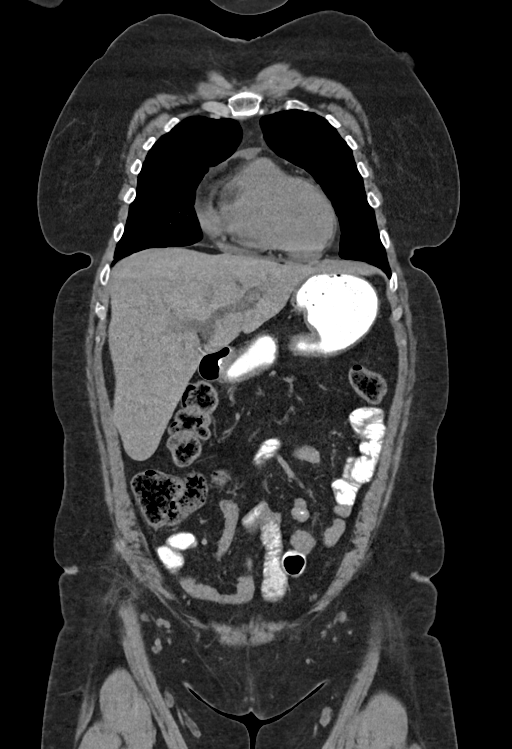
[im 72/162  soft-tissue]
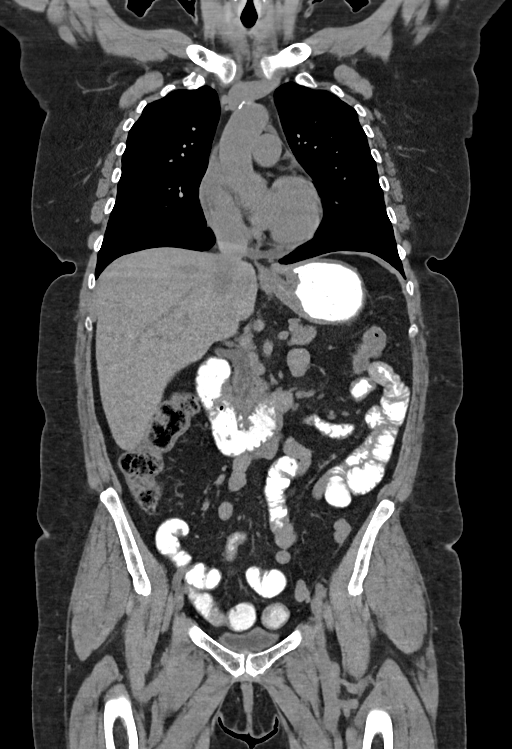
[im 90/162  soft-tissue]
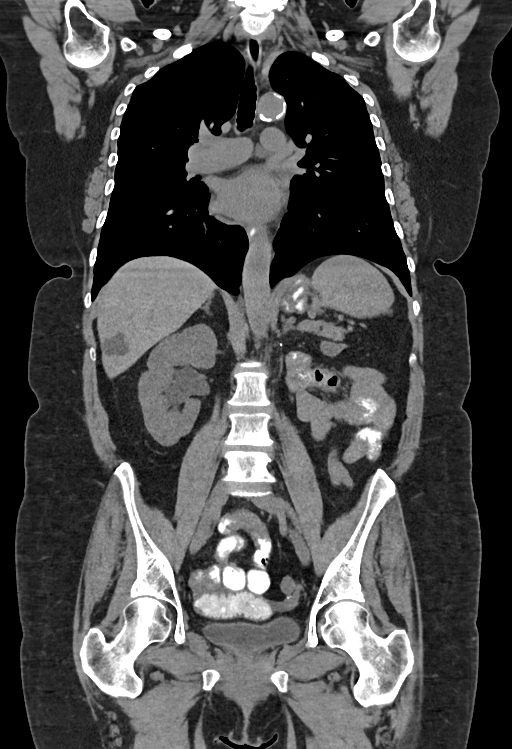

[14 of 46 positions shown; findings below may reference images not displayed]

FINDINGS: CT CHEST FINDINGS

Cardiovascular: Aortic atherosclerosis. Normal size heart. No
pericardial effusion.

Mediastinum/Nodes: Hypoattenuating 1.3 cm left thyroid nodule,
unchanged in size when remeasured for consistency. For which no
follow-up is recommended (ref: [HOSPITAL]. [DATE]):143-50). No pathologically enlarged mediastinal, hilar or
axillary lymph nodes. Radiopaque oral contrast again visualized
within the esophagus.

Lungs/Pleura: Biapical pleuroparenchymal scarring. Again seen are
few scattered tiny peripheral pulmonary nodules which are unchanged
and considered benign. Calcified granuloma in the right middle lobe.
No pleural effusion. No pneumothorax.

Musculoskeletal: Small sclerotic lesion T12 vertebral body is again
seen and appears unchanged and likely benign. No suspicious lytic or
blastic lesions of bone.

CT ABDOMEN PELVIS FINDINGS

Hepatobiliary: Unchanged hepatics cysts. No new suspicious hepatic
lesion. Cholecystectomy. Mild reservoir effect of biliary tree.

Pancreas: Unremarkable. No pancreatic ductal dilatation or
surrounding inflammatory changes.

Spleen: Normal in size without focal abnormality.

Adrenals/Urinary Tract: Similar slight thickening of the left
adrenal gland without discrete nodularity. The right adrenal gland
is unremarkable. The right kidney is unremarkable. There is mild
dilation midportion the right ureter which is unchanged. Prior left
nephrectomy, no suspicious nodularity in the surgical bed. Urinary
bladder is predominantly decompressed but appears grossly
unremarkable.

Stomach/Bowel: Radiopaque ingested contrast visualized to the level
of the distal small bowel. There is residual radiopaque contrast in
the esophagus, suggestive of reflux or esophageal dysmotility.
Stomach is within normal limits. Appendix appears normal. No
evidence of bowel wall thickening, distention, or inflammatory
changes.

Vascular/Lymphatic: Aortic atherosclerosis. No enlarged abdominal or
pelvic lymph nodes.

Reproductive: Uterus and bilateral adnexa are unremarkable.

Other: No abdominopelvic free fluid. No pneumoperitoneum. No
peritoneal/retroperitoneal nodularity.

Musculoskeletal: Multilevel degenerative changes spine, including
multiple Schmorl's nodes. No suspicious lytic or blastic lesions of
bone
IMPRESSION: 1. Prior left nephrectomy. No evidence of recurrent or metastatic
disease within the chest, abdomen, or pelvis.
2. Radiopaque oral contrast again visualized within the esophagus,
findings which may reflect esophageal reflux or or dysmotility.
3. Aortic atherosclerosis.

Aortic Atherosclerosis ([id number]-E6S.S).

## 2021-01-30 ENCOUNTER — Telehealth: Payer: Self-pay | Admitting: Gastroenterology

## 2021-01-30 NOTE — Telephone Encounter (Signed)
Patient LVM asking for you to call her at [phone]

## 2021-01-31 ENCOUNTER — Other Ambulatory Visit: Payer: Self-pay | Admitting: Internal Medicine

## 2021-01-31 NOTE — Telephone Encounter (Signed)
rx sent in for clonazepam 330 with no refills.

## 2021-01-31 NOTE — Telephone Encounter (Signed)
Returned pt's call regarding her FMLA paperwork.

## 2021-01-31 NOTE — Telephone Encounter (Signed)
Last fill 10/21 last OV 01/03/21 okay to fill?

## 2021-02-07 ENCOUNTER — Telehealth: Payer: Self-pay | Admitting: Gastroenterology

## 2021-02-07 NOTE — Telephone Encounter (Signed)
Please call back regarding FMLA

## 2021-02-07 NOTE — Telephone Encounter (Signed)
Patient called again to tell you she does not need paper work

## 2021-02-08 ENCOUNTER — Telehealth: Payer: Self-pay | Admitting: Gastroenterology

## 2021-02-08 NOTE — Telephone Encounter (Signed)
Please call regarding FMLA paper work

## 2021-02-09 ENCOUNTER — Encounter: Payer: 59 | Admitting: Internal Medicine

## 2021-02-09 NOTE — Telephone Encounter (Signed)
Pt sent an email with corrections to her FMLA paperwork.

## 2021-02-21 ENCOUNTER — Other Ambulatory Visit: Payer: Self-pay | Admitting: Internal Medicine

## 2021-03-07 ENCOUNTER — Other Ambulatory Visit: Payer: Self-pay | Admitting: Internal Medicine

## 2021-03-08 ENCOUNTER — Other Ambulatory Visit: Payer: Self-pay | Admitting: Internal Medicine

## 2021-03-08 NOTE — Telephone Encounter (Signed)
Rx sent in for clonazepam #30 with no refills.  Keep appt 03/16/21

## 2021-03-16 ENCOUNTER — Telehealth: Payer: Self-pay

## 2021-03-16 ENCOUNTER — Ambulatory Visit (INDEPENDENT_AMBULATORY_CARE_PROVIDER_SITE_OTHER): Payer: 59 | Admitting: Internal Medicine

## 2021-03-16 ENCOUNTER — Other Ambulatory Visit: Payer: Self-pay

## 2021-03-16 VITALS — BP 122/70 | HR 63 | Temp 97.9°F | Resp 16 | Ht 70.0 in | Wt 233.0 lb

## 2021-03-16 DIAGNOSIS — Z0000 Encounter for general adult medical examination without abnormal findings: Secondary | ICD-10-CM | POA: Diagnosis not present

## 2021-03-16 DIAGNOSIS — I471 Supraventricular tachycardia: Secondary | ICD-10-CM

## 2021-03-16 DIAGNOSIS — D509 Iron deficiency anemia, unspecified: Secondary | ICD-10-CM

## 2021-03-16 DIAGNOSIS — E559 Vitamin D deficiency, unspecified: Secondary | ICD-10-CM

## 2021-03-16 DIAGNOSIS — E7800 Pure hypercholesterolemia, unspecified: Secondary | ICD-10-CM

## 2021-03-16 DIAGNOSIS — Z85528 Personal history of other malignant neoplasm of kidney: Secondary | ICD-10-CM

## 2021-03-16 DIAGNOSIS — I10 Essential (primary) hypertension: Secondary | ICD-10-CM

## 2021-03-16 DIAGNOSIS — F32A Depression, unspecified: Secondary | ICD-10-CM

## 2021-03-16 DIAGNOSIS — I700 Atherosclerosis of aorta: Secondary | ICD-10-CM | POA: Diagnosis not present

## 2021-03-16 DIAGNOSIS — E1165 Type 2 diabetes mellitus with hyperglycemia: Secondary | ICD-10-CM

## 2021-03-16 DIAGNOSIS — M546 Pain in thoracic spine: Secondary | ICD-10-CM

## 2021-03-16 NOTE — Assessment & Plan Note (Signed)
Physical today 03/16/21.  Mammogram 07/10/20 - Birads I. Colonoscopy 2017.

## 2021-03-16 NOTE — Telephone Encounter (Signed)
[employer] letter printed per Dr Nicki Reaper

## 2021-03-16 NOTE — Progress Notes (Signed)
Patient ID: Jacqueline Nichols, female   DOB: 25-Dec-1955, 65 y.o.   MRN: [id number]   Subjective:    Patient ID: Jacqueline Nichols, female    DOB: May 30, 1956, 65 y.o.   MRN: [id number]  HPI This visit occurred during the SARS-CoV-2 public health emergency.  Safety protocols were in place, including screening questions prior to the visit, additional usage of staff PPE, and extensive cleaning of exam room while observing appropriate contact time as indicated for disinfecting solutions.   Patient here for her physical exam.  She reports persistent back pain (right side pain).  Previous xray revealed mild degenerative changes.  Persistent intermittent back pain.  She is concerned regarding her history of kidney cancer.  Reports pain is worse with certain movements.  Previously saw a chiropractor and this helped.  No chest pain or sob reported.  No abdominal pain or bowel change reported.  Taking citalopram.  She wants to change to wellbutrin.  States may help her lose weight.  Discussed further w/up for her pain.  Sees oncology.  Discussed f/u with oncology to discuss continued pain.    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   Urinary incontinence    Past Surgical History:  Procedure Laterality Date   CHOLECYSTECTOMY  2001   COLONOSCOPY WITH PROPOFOL N/A 03/27/2016   Procedure: COLONOSCOPY WITH PROPOFOL;  Surgeon: Lucilla Lame, MD;  Location: [facility] ENDOSCOPY;  Service: Endoscopy;  Laterality: N/A;   KNEE SURGERY  2003-2009   s/p 8 knee surgeries (with 3 knee replacements)   NEPHRECTOMY RADICAL  30/8657   NISSEN FUNDOPLICATION     Family History  Problem Relation Age of Onset   Colon cancer Mother        died - age 76   Heart disease Father    Hypertension Father    Lymphoma Father     Arthritis/Rheumatoid Sister    Lung cancer Brother        smoked   Arthritis/Rheumatoid Paternal Grandmother    Cancer Maternal Uncle        Unknown type of cancer    Cancer Paternal Aunt        Unknown type of cancer    Throat cancer Paternal Uncle    Colon cancer Paternal Uncle    Breast cancer Neg Hx    Social History   Socioeconomic History   Marital status: Divorced    Spouse name: Not on file   Number of children: 2   Years of education: Not on file   Highest education level: Not on file  Occupational History   Not on file  Tobacco Use   Smoking status: Former    Packs/day: 1.00    Years: 15.00    Pack years: 15.00    Types: Cigarettes    Quit date: 10/23/2012    Years since quitting: 8.4   Smokeless tobacco: Never   Tobacco comments:    Quit Feb 2014  Vaping Use   Vaping Use: Former   Start date: 02/20/2013   Quit date: 02/20/2014  Substance and Sexual Activity   Alcohol use: Yes    Alcohol/week: 1.0 standard drink    Types: 1 Glasses of wine per week  Comment: social drinking - approx 2-4 monthly    Drug use: No   Sexual activity: Not Currently  Other Topics Concern   Not on file  Social History Narrative   Not on file   Social Determinants of Health   Financial Resource Strain: Not on file  Food Insecurity: Not on file  Transportation Needs: Not on file  Physical Activity: Not on file  Stress: Not on file  Social Connections: Not on file    Review of Systems  Constitutional:  Negative for appetite change and unexpected weight change.  HENT:  Negative for congestion, sinus pressure and sore throat.   Eyes:  Negative for pain and visual disturbance.  Respiratory:  Negative for cough, chest tightness and shortness of breath.   Cardiovascular:  Negative for chest pain, palpitations and leg swelling.  Gastrointestinal:  Negative for abdominal pain, diarrhea, nausea and vomiting.  Genitourinary:  Negative for difficulty urinating and dysuria.   Musculoskeletal:  Positive for back pain. Negative for joint swelling and myalgias.  Skin:  Negative for color change and rash.  Neurological:  Negative for dizziness, light-headedness and headaches.  Hematological:  Negative for adenopathy. Does not bruise/bleed easily.  Psychiatric/Behavioral:  Negative for agitation and dysphoric mood.       Objective:    Physical Exam Vitals reviewed.  Constitutional:      General: She is not in acute distress.    Appearance: Normal appearance. She is well-developed.  HENT:     Head: Normocephalic and atraumatic.     Right Ear: External ear normal.     Left Ear: External ear normal.  Eyes:     General: No scleral icterus.       Right eye: No discharge.        Left eye: No discharge.     Conjunctiva/sclera: Conjunctivae normal.  Neck:     Thyroid: No thyromegaly.  Cardiovascular:     Rate and Rhythm: Normal rate and regular rhythm.  Pulmonary:     Effort: No tachypnea, accessory muscle usage or respiratory distress.     Breath sounds: Normal breath sounds. No decreased breath sounds or wheezing.  Chest:  Breasts:    Right: No inverted nipple, mass, nipple discharge or tenderness (no axillary adenopathy).     Left: No inverted nipple, mass, nipple discharge or tenderness (no axilarry adenopathy).  Abdominal:     General: Bowel sounds are normal.     Palpations: Abdomen is soft.     Tenderness: There is no abdominal tenderness.  Musculoskeletal:        General: No swelling or tenderness.     Cervical back: Neck supple.     Comments: Some increased pain with palpation - right mid back.   Lymphadenopathy:     Cervical: No cervical adenopathy.  Skin:    Findings: No erythema or rash.  Neurological:     Mental Status: She is alert and oriented to person, place, and time.  Psychiatric:        Mood and Affect: Mood normal.        Behavior: Behavior normal.    BP 122/70   Pulse 63   Temp 97.9 F (36.6 C)   Resp 16   Ht '5\' 10"'   (1.778 m)   Wt 233 lb (105.7 kg)   SpO2 98%   BMI 33.43 kg/m  Wt Readings from Last 3 Encounters:  03/16/21 233 lb (105.7 kg)  01/03/21 230 lb 6.4 oz (104.5 kg)  12/22/20 234  lb (106.1 kg)    Outpatient Encounter Medications as of 03/16/2021  Medication Sig   bisoprolol-hydrochlorothiazide (ZIAC) 2.5-6.25 MG tablet TAKE 1 TABLET BY MOUTH DAILY FOR HIGH BLOOD PRESSURE   butalbital-acetaminophen-caffeine (FIORICET, ESGIC) 50-325-40 MG tablet TAKE ONE TABLET EACH DAY AS NEEDED FOR HEADACHE   citalopram (CELEXA) 40 MG tablet TAKE 1 TABLET BY MOUTH DAILY   clonazePAM (KLONOPIN) 0.5 MG tablet TAKE 1 TABLET BY MOUTH EVERY DAY AS NEEDED   clopidogrel (PLAVIX) 75 MG tablet TAKE 1 TABLET BY MOUTH EVERY DAY   diphenoxylate-atropine (LOMOTIL) 2.5-0.025 MG tablet TAKE 1 TABLET BY MOUTH 4 TIMES DAILY AS NEEDED FOR DIARRHEA OR LOOSE STOOLS   Loperamide HCl (IMODIUM PO) Take by mouth as needed.   meloxicam (MOBIC) 7.5 MG tablet TAKE ONE TABLET TWICE DAILY   Misc Natural Products (ALLERGY RELEAF SYSTEM PO) Take by mouth. Reported on 03/20/2016   nystatin cream (MYCOSTATIN) Apply 1 application topically 2 (two) times daily.   rosuvastatin (CRESTOR) 5 MG tablet TAKE 1 TABLET BY MOUTH DAILY   tiZANidine (ZANAFLEX) 4 MG tablet TAKE 1 TABLET BY MOUTH AT BEDTIME AS NEEDED FOR MUSCLE SPASMS   torsemide (DEMADEX) 20 MG tablet TAKE 1 TABLET BY MOUTH EVERY DAY AS NEEDED.   No facility-administered encounter medications on file as of 03/16/2021.     Lab Results  Component Value Date   WBC 5.4 09/25/2020   HGB 13.0 09/25/2020   HCT 38.2 09/25/2020   PLT 188 09/25/2020   GLUCOSE 94 09/25/2020   CHOL 138 07/07/2020   TRIG 79 07/07/2020   HDL 46 07/07/2020   LDLCALC 77 07/07/2020   ALT 24 09/25/2020   AST 27 09/25/2020   NA 137 09/25/2020   K 4.4 09/25/2020   CL 103 09/25/2020   CREATININE 0.96 09/25/2020   BUN 27 (H) 09/25/2020   CO2 27 09/25/2020   TSH 2.390 01/28/2020   HGBA1C 5.2 07/07/2020        Assessment & Plan:   Problem List Items Addressed This Visit     Anemia    Follow cbc.        Aortic atherosclerosis (HCC)    Continue crestor.         Back pain    Aggravated by certain movements.  Some reproducible pain on exam.  Chiropractor helped. Had recent CT chest, abdomen and pelvic.  No abnormality seen.  Xray - degenerative changes.  Tylenol.  Antiinflammatory.  Discussed further w/up.  She is concerned given history of renal cell cancer.  Discussed further w/up.  She desires f/u with oncology to discuss.  Will arrange appt.        Depression    Continues on citalopram.  Discussed further treatment options.  She was requesting to change to wellbutrin.  Will hold on changing at this time.  Continue citalopram.  Follow.        Diabetes mellitus ([place])    Low carb diet and exercise given blood sugar elevated.  Follow met b and a1c.        Relevant Orders   TSH   Hemoglobin A1c   Microalbumin / creatinine urine ratio   Health care maintenance    Physical today 03/16/21.  Mammogram 07/10/20 - [facility] I. Colonoscopy 2017.        Hypercholesteremia    Continue crestor.        Relevant Orders   CBC with Differential/Platelet   Lipid panel   Hepatic function panel   Hypertension  Continue on torsemide and ziac.  Blood pressure as outlined - doing well.  Follow pressures.  Follow metabolic panel.        Relevant Orders   Basic metabolic panel   Personal history of renal cell carcinoma    S/p left radical nephrectomy and partial adrenalectomy.  Followed by Dr Tasia Catchings.         SVT (supraventricular tachycardia) ([place])    Stable on ziac.        Vitamin D deficiency    Continue vitamin D supplements.         Relevant Orders   VITAMIN D 25 Hydroxy (Vit-D Deficiency, Fractures)   Other Visit Diagnoses     Routine general medical examination at a health care facility    -  Primary        Einar Pheasant, MD

## 2021-03-18 ENCOUNTER — Encounter: Payer: Self-pay | Admitting: Internal Medicine

## 2021-03-18 NOTE — Assessment & Plan Note (Signed)
Continue crestor 

## 2021-03-18 NOTE — Assessment & Plan Note (Signed)
Continue vitamin D supplements.  

## 2021-03-18 NOTE — Assessment & Plan Note (Signed)
Continue on torsemide and ziac.  Blood pressure as outlined - doing well.  Follow pressures.  Follow metabolic panel.  

## 2021-03-18 NOTE — Assessment & Plan Note (Signed)
S/p left radical nephrectomy and partial adrenalectomy.  Followed by Dr Yu.   

## 2021-03-18 NOTE — Assessment & Plan Note (Signed)
Aggravated by certain movements.  Some reproducible pain on exam.  Chiropractor helped. Had recent CT chest, abdomen and pelvic.  No abnormality seen.  Xray - degenerative changes.  Tylenol.  Antiinflammatory.  Discussed further w/up.  She is concerned given history of renal cell cancer.  Discussed further w/up.  She desires f/u with oncology to discuss.  Will arrange appt.

## 2021-03-18 NOTE — Assessment & Plan Note (Signed)
Stable on ziac.  

## 2021-03-18 NOTE — Assessment & Plan Note (Signed)
Follow cbc.  

## 2021-03-18 NOTE — Assessment & Plan Note (Signed)
Low carb diet and exercise given blood sugar elevated.  Follow met b and a1c.  

## 2021-03-18 NOTE — Assessment & Plan Note (Signed)
Continues on citalopram.  Discussed further treatment options.  She was requesting to change to wellbutrin.  Will hold on changing at this time.  Continue citalopram.  Follow.

## 2021-03-20 LAB — BASIC METABOLIC PANEL
BUN/Creatinine Ratio: 19 (ref 12–28)
BUN: 18 mg/dL (ref 8–27)
CO2: 25 mmol/L (ref 20–29)
Calcium: 9.6 mg/dL (ref 8.7–10.3)
Chloride: 103 mmol/L (ref 96–106)
Creatinine, Ser: 0.95 mg/dL (ref 0.57–1.00)
Glucose: 89 mg/dL (ref 65–99)
Potassium: 4.7 mmol/L (ref 3.5–5.2)
Sodium: 139 mmol/L (ref 134–144)
eGFR: 67 mL/min/{1.73_m2} (ref >59)

## 2021-03-20 LAB — CBC WITH DIFFERENTIAL/PLATELET
Basophils Absolute: 0 10*3/uL (ref 0.0–0.2)
Basos: 0 %
EOS (ABSOLUTE): 0.1 10*3/uL (ref 0.0–0.4)
Eos: 3 %
Hematocrit: 41 % (ref 34.0–46.6)
Hemoglobin: 13.4 g/dL (ref 11.1–15.9)
Immature Grans (Abs): 0 10*3/uL (ref 0.0–0.1)
Immature Granulocytes: 0 %
Lymphocytes Absolute: 1.1 10*3/uL (ref 0.7–3.1)
Lymphs: 25 %
MCH: 28.9 pg (ref 26.6–33.0)
MCHC: 32.7 g/dL (ref 31.5–35.7)
MCV: 88 fL (ref 79–97)
Monocytes Absolute: 0.3 10*3/uL (ref 0.1–0.9)
Monocytes: 7 %
Neutrophils Absolute: 2.9 10*3/uL (ref 1.4–7.0)
Neutrophils: 65 %
Platelets: 204 10*3/uL (ref 150–450)
RBC: 4.64 x10E6/uL (ref 3.77–5.28)
RDW: 12.6 % (ref 11.7–15.4)
WBC: 4.4 10*3/uL (ref 3.4–10.8)

## 2021-03-20 LAB — LIPID PANEL
Chol/HDL Ratio: 3.5 ratio (ref 0.0–4.4)
Cholesterol, Total: 169 mg/dL (ref 100–199)
HDL: 48 mg/dL (ref >39)
LDL Chol Calc (NIH): 103 mg/dL — ABNORMAL HIGH (ref 0–99)
Triglycerides: 95 mg/dL (ref 0–149)
VLDL Cholesterol Cal: 18 mg/dL (ref 5–40)

## 2021-03-20 LAB — HEPATIC FUNCTION PANEL
ALT: 16 IU/L (ref 0–32)
AST: 17 IU/L (ref 0–40)
Albumin: 4.2 g/dL (ref 3.8–4.8)
Alkaline Phosphatase: 98 IU/L (ref 44–121)
Bilirubin Total: 0.5 mg/dL (ref 0.0–1.2)
Bilirubin, Direct: <0.1 mg/dL (ref 0.00–0.40)
Total Protein: 6.2 g/dL (ref 6.0–8.5)

## 2021-03-20 LAB — HEMOGLOBIN A1C
Est. average glucose Bld gHb Est-mCnc: 120 mg/dL
Hgb A1c MFr Bld: 5.8 % — ABNORMAL HIGH (ref 4.8–5.6)

## 2021-03-20 LAB — TSH: TSH: 0.894 u[IU]/mL (ref 0.450–4.500)

## 2021-03-20 LAB — VITAMIN D 25 HYDROXY (VIT D DEFICIENCY, FRACTURES): Vit D, 25-Hydroxy: 30.7 ng/mL (ref 30.0–100.0)

## 2021-04-12 ENCOUNTER — Other Ambulatory Visit: Payer: Self-pay | Admitting: Internal Medicine

## 2021-04-12 NOTE — Telephone Encounter (Signed)
RX Refill:klonopin Last Seen:03-16-21 Last ordered:03-08-21

## 2021-04-13 NOTE — Telephone Encounter (Signed)
Rx sent in for clonazepam 330 with no refills.

## 2021-04-26 ENCOUNTER — Other Ambulatory Visit: Payer: Self-pay | Admitting: Internal Medicine

## 2021-05-06 IMAGING — DX DG RIBS 2V*R*
3 series · 3 of 3 positions shown · non-contrast
Comparison: Single-view of the chest 07/21/2015.

CLINICAL DATA: Posterior and lateral right rib pain. History of
renal cell carcinoma.

EXAM:
RIGHT RIBS - 2 VIEW

[chest pa]
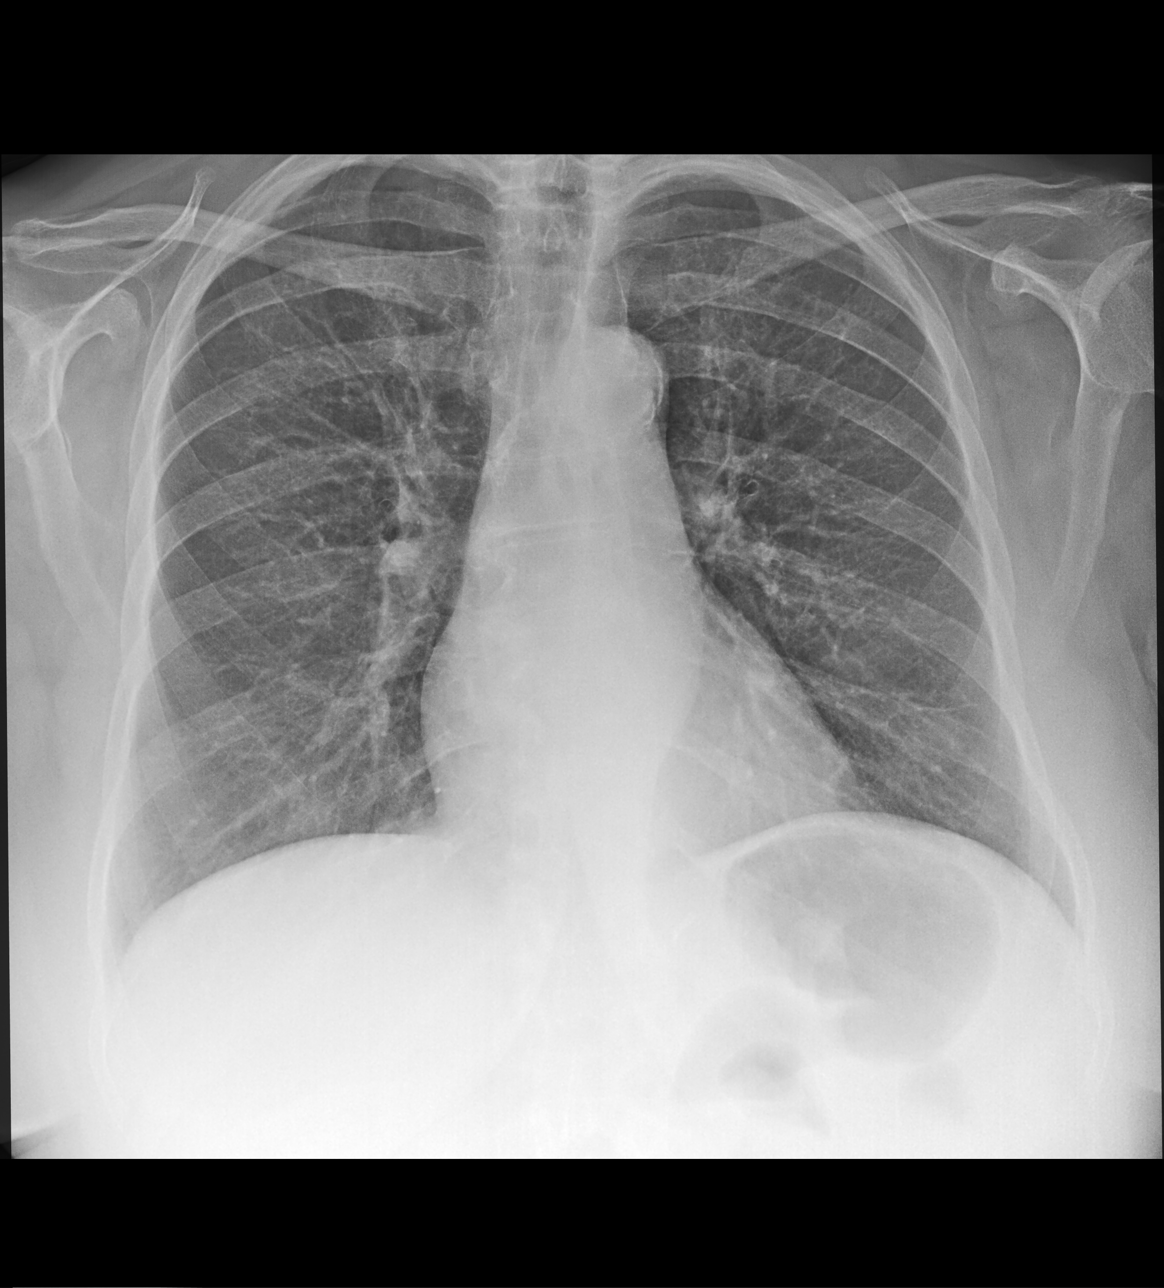

[oblique ribs obl (oblique) (1 of 2)]
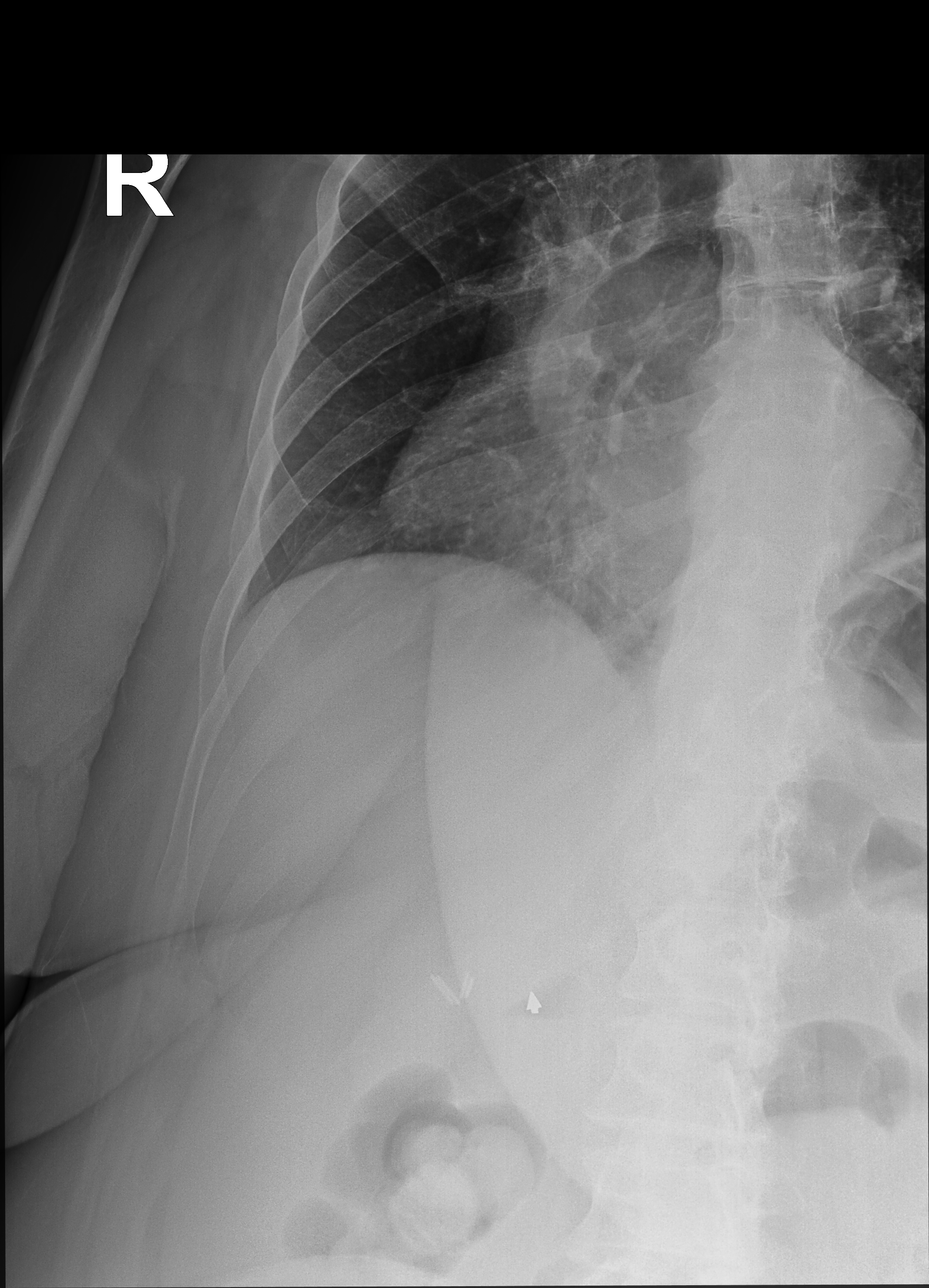

[oblique ribs obl (oblique) (2 of 2)]
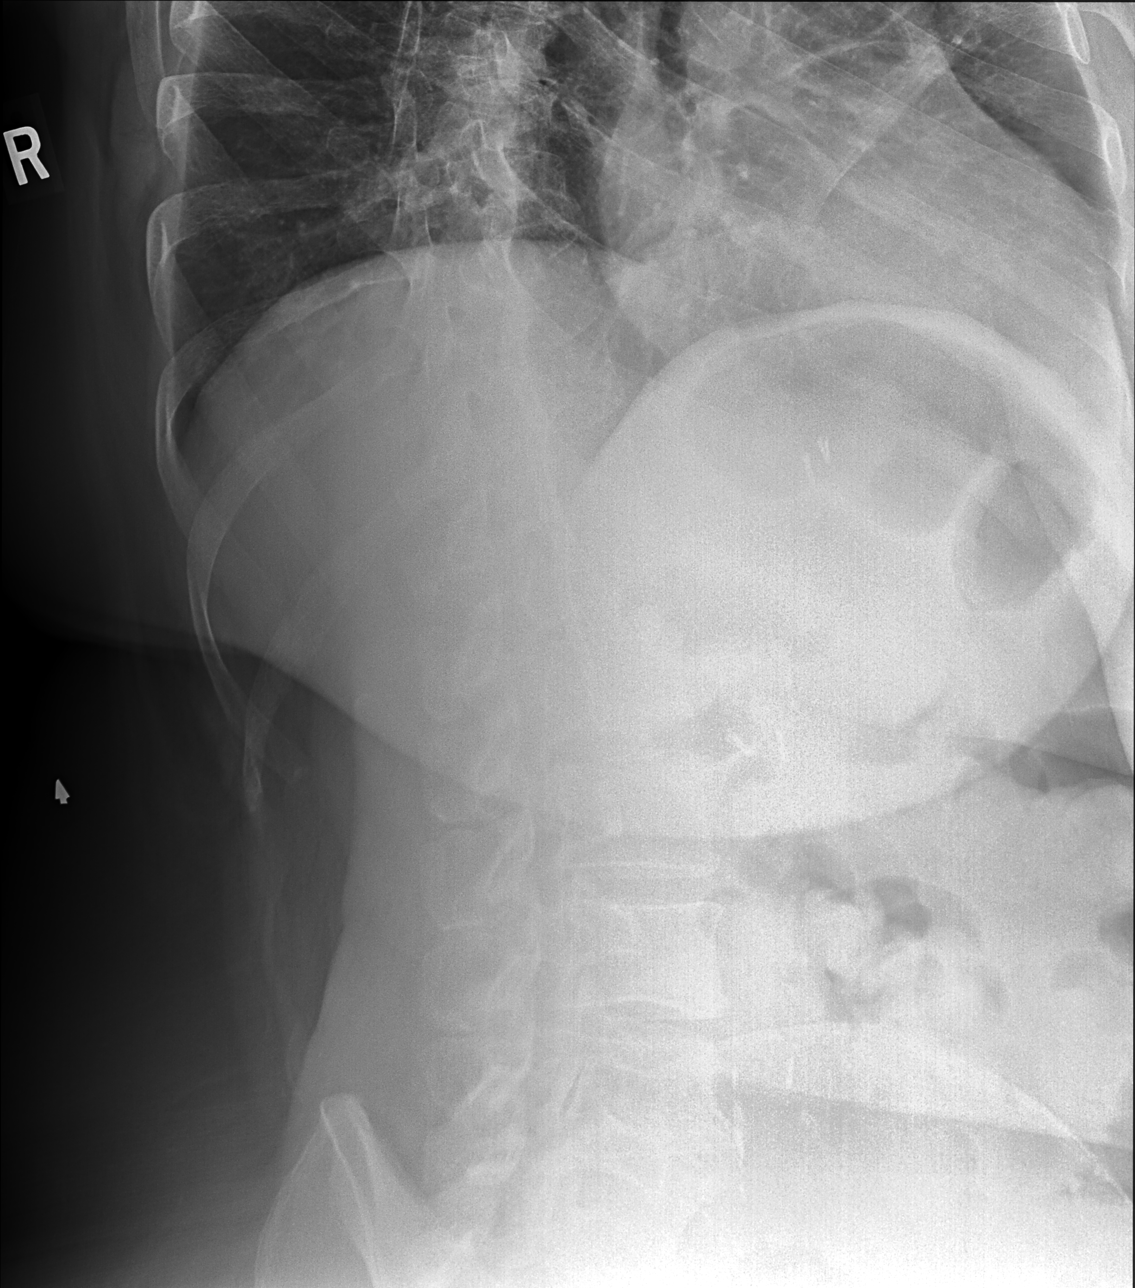

[3 of 3 positions shown; findings below may reference images not displayed]

FINDINGS: Single-view of the chest demonstrates clear lungs and normal heart
size. Aortic atherosclerosis noted. No fracture or other bone
lesions are seen involving the ribs.
IMPRESSION: Negative for fracture or focal bony abnormality.

No acute cardiopulmonary disease.

Aortic Atherosclerosis ([id number]).

## 2021-06-06 ENCOUNTER — Other Ambulatory Visit: Payer: Self-pay | Admitting: Internal Medicine

## 2021-06-07 NOTE — Telephone Encounter (Signed)
Rx ok;d for clonazepam #30 with one refill.  

## 2021-06-07 NOTE — Telephone Encounter (Signed)
RX Refill:klonopin Last Seen:03-16-21 Last ordered:04-13-21

## 2021-06-19 ENCOUNTER — Other Ambulatory Visit: Payer: Self-pay | Admitting: Internal Medicine

## 2021-06-22 ENCOUNTER — Ambulatory Visit: Payer: 59 | Admitting: Internal Medicine

## 2021-06-22 ENCOUNTER — Telehealth: Payer: Self-pay | Admitting: Internal Medicine

## 2021-06-22 ENCOUNTER — Other Ambulatory Visit: Payer: Self-pay

## 2021-06-22 VITALS — BP 128/70 | HR 62 | Temp 98.0°F | Resp 16 | Ht 70.0 in | Wt 235.0 lb

## 2021-06-22 DIAGNOSIS — N644 Mastodynia: Secondary | ICD-10-CM | POA: Diagnosis not present

## 2021-06-22 DIAGNOSIS — Z23 Encounter for immunization: Secondary | ICD-10-CM | POA: Diagnosis not present

## 2021-06-22 DIAGNOSIS — I10 Essential (primary) hypertension: Secondary | ICD-10-CM | POA: Diagnosis not present

## 2021-06-22 NOTE — Progress Notes (Signed)
Patient ID: MARYAGNES CARRASCO, female   DOB: 1955/09/08, 65 y.o.   MRN: [id number]   Subjective:    Patient ID: ORIANA HORIUCHI, female    DOB: 10/14/1955, 65 y.o.   MRN: [id number]  This visit occurred during the SARS-CoV-2 public health emergency.  Safety protocols were in place, including screening questions prior to the visit, additional usage of staff PPE, and extensive cleaning of exam room while observing appropriate contact time as indicated for disinfecting solutions.   Patient here for work in appt.  Chief Complaint  Patient presents with   Breast Pain   .   HPI Work in for breast pain.  Has noticed sharp breast pain - intermittent - starts inferior breast and shoots up towards her nipple - 5:00 region of left breast.  No rash.  No known injury.  No nipple discharge or nipple retraction present.  Present for the last two weeks.  No right breast tenderness.    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   Urinary incontinence    Past Surgical History:  Procedure Laterality Date   CHOLECYSTECTOMY  2001   COLONOSCOPY WITH PROPOFOL N/A 03/27/2016   Procedure: COLONOSCOPY WITH PROPOFOL;  Surgeon: Lucilla Lame, MD;  Location: [facility] ENDOSCOPY;  Service: Endoscopy;  Laterality: N/A;   KNEE SURGERY  2003-2009   s/p 8 knee surgeries (with 3 knee replacements)   NEPHRECTOMY RADICAL  13/2440   NISSEN FUNDOPLICATION     Family History  Problem Relation Age of Onset   Colon cancer Mother        died - age 44   Heart disease Father    Hypertension Father    Lymphoma Father    Arthritis/Rheumatoid Sister    Lung cancer Brother        smoked   Arthritis/Rheumatoid Paternal Grandmother    Cancer Maternal Uncle        Unknown type of cancer    Cancer Paternal Aunt        Unknown type of  cancer    Throat cancer Paternal Uncle    Colon cancer Paternal Uncle    Breast cancer Neg Hx    Social History   Socioeconomic History   Marital status: Divorced    Spouse name: Not on file   Number of children: 2   Years of education: Not on file   Highest education level: Not on file  Occupational History   Not on file  Tobacco Use   Smoking status: Former    Packs/day: 1.00    Years: 15.00    Pack years: 15.00    Types: Cigarettes    Quit date: 10/23/2012    Years since quitting: 8.6   Smokeless tobacco: Never   Tobacco comments:    Quit Feb 2014  Vaping Use   Vaping Use: Former   Start date: 02/20/2013   Quit date: 02/20/2014  Substance and Sexual Activity   Alcohol use: Yes    Alcohol/week: 1.0 standard drink    Types: 1 Glasses of wine per week    Comment: social drinking - approx 2-4 monthly    Drug use: No   Sexual activity: Not Currently  Other Topics Concern  Not on file  Social History Narrative   Not on file   Social Determinants of Health   Financial Resource Strain: Not on file  Food Insecurity: Not on file  Transportation Needs: Not on file  Physical Activity: Not on file  Stress: Not on file  Social Connections: Not on file     Review of Systems  Constitutional:  Negative for appetite change and fever.  Respiratory:  Negative for cough, chest tightness and shortness of breath.   Cardiovascular:  Negative for chest pain and leg swelling.  Gastrointestinal:  Negative for diarrhea, nausea and vomiting.  Musculoskeletal:  Negative for joint swelling and myalgias.  Skin:  Negative for color change and rash.  Neurological:  Negative for dizziness, light-headedness and headaches.  Psychiatric/Behavioral:  Negative for agitation.       Objective:     BP 128/70   Pulse 62   Temp 98 F (36.7 C)   Resp 16   Ht 5\' 10"  (1.778 m)   Wt 235 lb (106.6 kg)   SpO2 97%   BMI 33.72 kg/m  Wt Readings from Last 3 Encounters:  06/22/21 235 lb  (106.6 kg)  03/16/21 233 lb (105.7 kg)  01/03/21 230 lb 6.4 oz (104.5 kg)    Physical Exam Vitals reviewed.  Constitutional:      General: She is not in acute distress.    Appearance: Normal appearance. She is well-developed.  HENT:     Head: Normocephalic and atraumatic.  Neck:     Thyroid: No thyromegaly.  Cardiovascular:     Rate and Rhythm: Normal rate and regular rhythm.  Pulmonary:     Effort: No tachypnea, accessory muscle usage or respiratory distress.     Breath sounds: Normal breath sounds. No decreased breath sounds or wheezing.  Chest:  Breasts:    Right: No inverted nipple, mass, nipple discharge or tenderness (no axillary adenopathy).     Left: No inverted nipple, mass, nipple discharge or tenderness (no axilarry adenopathy).  Abdominal:     General: Bowel sounds are normal.     Palpations: Abdomen is soft.     Tenderness: There is no abdominal tenderness.  Musculoskeletal:     Cervical back: No tenderness.  Lymphadenopathy:     Cervical: No cervical adenopathy.  Skin:    Findings: No erythema or rash.  Neurological:     Mental Status: She is alert.  Psychiatric:        Mood and Affect: Mood normal.        Behavior: Behavior normal.     Outpatient Encounter Medications as of 06/22/2021  Medication Sig   bisoprolol-hydrochlorothiazide (ZIAC) 2.5-6.25 MG tablet TAKE 1 TABLET BY MOUTH DAILY FOR HIGH BLOOD PRESSURE   butalbital-acetaminophen-caffeine (FIORICET, ESGIC) 50-325-40 MG tablet TAKE ONE TABLET EACH DAY AS NEEDED FOR HEADACHE   citalopram (CELEXA) 40 MG tablet TAKE 1 TABLET BY MOUTH DAILY   clonazePAM (KLONOPIN) 0.5 MG tablet TAKE 1 TABLET BY MOUTH EVERY DAY AS NEEDED   clopidogrel (PLAVIX) 75 MG tablet TAKE 1 TABLET BY MOUTH EVERY DAY   diphenoxylate-atropine (LOMOTIL) 2.5-0.025 MG tablet TAKE 1 TABLET BY MOUTH 4 TIMES DAILY AS NEEDED FOR DIARRHEA OR LOOSE STOOLS   Loperamide HCl (IMODIUM PO) Take by mouth as needed.   meloxicam (MOBIC) 7.5 MG  tablet TAKE ONE TABLET TWICE DAILY   Misc Natural Products (ALLERGY RELEAF SYSTEM PO) Take by mouth. Reported on 03/20/2016   nystatin cream (MYCOSTATIN) Apply 1 application topically 2 (two) times  daily.   rosuvastatin (CRESTOR) 5 MG tablet TAKE 1 TABLET BY MOUTH DAILY   tiZANidine (ZANAFLEX) 4 MG tablet TAKE 1 TABLET BY MOUTH AT BEDTIME AS NEEDED FOR MUSCLE SPASMS   torsemide (DEMADEX) 20 MG tablet TAKE 1 TABLET BY MOUTH EVERY DAY AS NEEDED.   No facility-administered encounter medications on file as of 06/22/2021.     Lab Results  Component Value Date   WBC 4.4 03/19/2021   HGB 13.4 03/19/2021   HCT 41.0 03/19/2021   PLT 204 03/19/2021   GLUCOSE 89 03/19/2021   CHOL 169 03/19/2021   TRIG 95 03/19/2021   HDL 48 03/19/2021   LDLCALC 103 (H) 03/19/2021   ALT 16 03/19/2021   AST 17 03/19/2021   NA 139 03/19/2021   K 4.7 03/19/2021   CL 103 03/19/2021   CREATININE 0.95 03/19/2021   BUN 18 03/19/2021   CO2 25 03/19/2021   TSH 0.894 03/19/2021   HGBA1C 5.8 (H) 03/19/2021      Assessment & Plan:   Problem List Items Addressed This Visit     Breast pain, left    Left breast pain as outlined.   No palpable mass.  Given persistent intermittent pain, will obtain left breast mammogram with possible ultrasound to further evaluate.        Relevant Orders   MM DIAG BREAST TOMO BILATERAL   US BREAST LTD UNI LEFT INC AXILLA   Hypertension    Continue on torsemide and ziac.  Blood pressure as outlined - doing well.  Follow pressures.  Follow metabolic panel.       Other Visit Diagnoses     Need for immunization against influenza    -  Primary   Relevant Orders   Flu Vaccine QUAD 46mo+IM (Fluarix, Fluzone & Alfiuria Quad PF) (Completed)        Einar Pheasant, MD

## 2021-06-22 NOTE — Telephone Encounter (Signed)
Lft pt vm to call ofc to sch Diag mammo and [place]. thanks

## 2021-06-24 ENCOUNTER — Encounter: Payer: Self-pay | Admitting: Internal Medicine

## 2021-06-24 NOTE — Assessment & Plan Note (Signed)
Left breast pain as outlined.   No palpable mass.  Given persistent intermittent pain, will obtain left breast mammogram with possible ultrasound to further evaluate.

## 2021-06-24 NOTE — Assessment & Plan Note (Signed)
Continue on torsemide and ziac.  Blood pressure as outlined - doing well.  Follow pressures.  Follow metabolic panel.  

## 2021-06-28 ENCOUNTER — Other Ambulatory Visit: Payer: Self-pay | Admitting: Internal Medicine

## 2021-07-06 ENCOUNTER — Other Ambulatory Visit: Payer: 59

## 2021-07-13 ENCOUNTER — Ambulatory Visit
Admission: RE | Admit: 2021-07-13 | Discharge: 2021-07-13 | Disposition: A | Discharge status: Home / Self Care | Payer: 59 | Source: Ambulatory Visit | Attending: Internal Medicine | Admitting: Internal Medicine

## 2021-07-13 ENCOUNTER — Other Ambulatory Visit: Payer: Self-pay

## 2021-07-13 DIAGNOSIS — N644 Mastodynia: Secondary | ICD-10-CM

## 2021-07-16 ENCOUNTER — Telehealth: Payer: Self-pay | Admitting: Internal Medicine

## 2021-07-16 NOTE — Telephone Encounter (Signed)
Patient returned office phone for results.

## 2021-07-16 NOTE — Telephone Encounter (Signed)
I called the patient back and informed  her of her mammogram results.  Attila Mccarthy,cma

## 2021-08-03 ENCOUNTER — Other Ambulatory Visit: Payer: Self-pay

## 2021-08-03 ENCOUNTER — Encounter: Payer: Self-pay | Admitting: Internal Medicine

## 2021-08-03 ENCOUNTER — Ambulatory Visit: Payer: 59 | Admitting: Internal Medicine

## 2021-08-03 DIAGNOSIS — I700 Atherosclerosis of aorta: Secondary | ICD-10-CM

## 2021-08-03 DIAGNOSIS — I471 Supraventricular tachycardia: Secondary | ICD-10-CM

## 2021-08-03 DIAGNOSIS — I10 Essential (primary) hypertension: Secondary | ICD-10-CM | POA: Diagnosis not present

## 2021-08-03 DIAGNOSIS — Z85528 Personal history of other malignant neoplasm of kidney: Secondary | ICD-10-CM

## 2021-08-03 DIAGNOSIS — E7800 Pure hypercholesterolemia, unspecified: Secondary | ICD-10-CM

## 2021-08-03 DIAGNOSIS — R109 Unspecified abdominal pain: Secondary | ICD-10-CM

## 2021-08-03 DIAGNOSIS — F32A Depression, unspecified: Secondary | ICD-10-CM

## 2021-08-03 DIAGNOSIS — Z86010 Personal history of colonic polyps: Secondary | ICD-10-CM

## 2021-08-03 DIAGNOSIS — E1165 Type 2 diabetes mellitus with hyperglycemia: Secondary | ICD-10-CM

## 2021-08-03 DIAGNOSIS — K589 Irritable bowel syndrome without diarrhea: Secondary | ICD-10-CM

## 2021-08-03 DIAGNOSIS — D509 Iron deficiency anemia, unspecified: Secondary | ICD-10-CM

## 2021-08-03 MED ORDER — CLENPIQ 10-3.5-12 MG-GM -GM/160ML PO SOLN: 320.0000 mL | ORAL | 0 refills | Status: DC

## 2021-08-03 NOTE — Progress Notes (Signed)
Patient ID: Jacqueline Nichols, female   DOB: 18-Oct-1955, 65 y.o.   MRN: [id number]   Subjective:    Patient ID: Jacqueline Nichols, female    DOB: 03-12-1956, 65 y.o.   MRN: [id number]  This visit occurred during the SARS-CoV-2 public health emergency.  Safety protocols were in place, including screening questions prior to the visit, additional usage of staff PPE, and extensive cleaning of exam room while observing appropriate contact time as indicated for disinfecting solutions.   Patient here for a scheduled follow up.    HPI Here to follow up regarding her cholesterol, blood sugar and blood pressure.  Reports persistent right side/back pain.  Worse with certain movements.  If she twists - catches.  She has a history of kidney cancer.  Is concerned regarding persistent pain.  Previous xray reveals mild thoracic degenerative change and rib xray revealed no acute fracture or focal bony abnormality.  Had questions about further testing.  No chest pain or sob reported.  No abdominal pain.  Bowels moving.  Handling stress.  Discussed diet and exercise.     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   Urinary incontinence    Past Surgical History:  Procedure Laterality Date   CHOLECYSTECTOMY  2001   COLONOSCOPY WITH PROPOFOL N/A 03/27/2016   Procedure: COLONOSCOPY WITH PROPOFOL;  Surgeon: Lucilla Lame, MD;  Location: [facility] ENDOSCOPY;  Service: Endoscopy;  Laterality: N/A;   KNEE SURGERY  2003-2009   s/p 8 knee surgeries (with 3 knee replacements)   NEPHRECTOMY RADICAL  50/3888   NISSEN FUNDOPLICATION     Family History  Problem Relation Age of Onset   Colon cancer Mother        died - age 53   Heart disease Father    Hypertension Father    Lymphoma Father    Arthritis/Rheumatoid Sister    Lung  cancer Brother        smoked   Arthritis/Rheumatoid Paternal Grandmother    Cancer Maternal Uncle        Unknown type of cancer    Cancer Paternal Aunt        Unknown type of cancer    Throat cancer Paternal Uncle    Colon cancer Paternal Uncle    Breast cancer Neg Hx    Social History   Socioeconomic History   Marital status: Divorced    Spouse name: Not on file   Number of children: 2   Years of education: Not on file   Highest education level: Not on file  Occupational History   Not on file  Tobacco Use   Smoking status: Former    Packs/day: 1.00    Years: 15.00    Pack years: 15.00    Types: Cigarettes    Quit date: 10/23/2012    Years since quitting: 8.7   Smokeless tobacco: Never   Tobacco comments:    Quit Feb 2014  Vaping Use   Vaping Use: Former   Start date: 02/20/2013   Quit date: 02/20/2014  Substance and Sexual Activity   Alcohol use: Yes    Alcohol/week: 1.0 standard drink    Types: 1 Glasses of wine per week  Comment: social drinking - approx 2-4 monthly    Drug use: No   Sexual activity: Not Currently  Other Topics Concern   Not on file  Social History Narrative   Not on file   Social Determinants of Health   Financial Resource Strain: Not on file  Food Insecurity: Not on file  Transportation Needs: Not on file  Physical Activity: Not on file  Stress: Not on file  Social Connections: Not on file     Review of Systems  Constitutional:  Negative for appetite change and unexpected weight change.  HENT:  Negative for congestion and sinus pressure.   Respiratory:  Negative for cough, chest tightness and shortness of breath.   Cardiovascular:  Negative for chest pain, palpitations and leg swelling.  Gastrointestinal:  Negative for abdominal pain, diarrhea, nausea and vomiting.  Genitourinary:  Negative for difficulty urinating and dysuria.  Musculoskeletal:  Negative for joint swelling and myalgias.       Right side pain as outlined.    Skin:  Negative for color change and rash.  Neurological:  Negative for dizziness, light-headedness and headaches.  Psychiatric/Behavioral:  Negative for agitation and dysphoric mood.       Objective:     BP 122/70   Pulse (!) 58   Temp (!) 96.1 F (35.6 C) (Skin)   Ht '5\' 10"'  (1.778 m)   Wt 242 lb 9.6 oz (110 kg)   SpO2 97%   BMI 34.81 kg/m  Wt Readings from Last 3 Encounters:  08/03/21 242 lb 9.6 oz (110 kg)  06/22/21 235 lb (106.6 kg)  03/16/21 233 lb (105.7 kg)    Physical Exam Vitals reviewed.  Constitutional:      General: She is not in acute distress.    Appearance: Normal appearance.  HENT:     Head: Normocephalic and atraumatic.     Right Ear: External ear normal.     Left Ear: External ear normal.  Eyes:     General: No scleral icterus.       Right eye: No discharge.        Left eye: No discharge.     Conjunctiva/sclera: Conjunctivae normal.  Neck:     Thyroid: No thyromegaly.  Cardiovascular:     Rate and Rhythm: Normal rate and regular rhythm.  Pulmonary:     Effort: No respiratory distress.     Breath sounds: Normal breath sounds. No wheezing.  Abdominal:     General: Bowel sounds are normal.     Palpations: Abdomen is soft.     Tenderness: There is no abdominal tenderness.  Musculoskeletal:        General: No swelling or tenderness.     Cervical back: Neck supple. No tenderness.  Lymphadenopathy:     Cervical: No cervical adenopathy.  Skin:    Findings: No erythema or rash.  Neurological:     Mental Status: She is alert.  Psychiatric:        Mood and Affect: Mood normal.        Behavior: Behavior normal.     Outpatient Encounter Medications as of 08/03/2021  Medication Sig   bisoprolol-hydrochlorothiazide (ZIAC) 2.5-6.25 MG tablet TAKE 1 TABLET BY MOUTH DAILY FOR HIGH BLOOD PRESSURE   butalbital-acetaminophen-caffeine (FIORICET, ESGIC) 50-325-40 MG tablet TAKE ONE TABLET EACH DAY AS NEEDED FOR HEADACHE   citalopram (CELEXA) 40 MG tablet  TAKE 1 TABLET BY MOUTH DAILY   clonazePAM (KLONOPIN) 0.5 MG tablet TAKE 1 TABLET BY MOUTH EVERY DAY  AS NEEDED   clopidogrel (PLAVIX) 75 MG tablet TAKE 1 TABLET BY MOUTH EVERY DAY   diphenoxylate-atropine (LOMOTIL) 2.5-0.025 MG tablet TAKE 1 TABLET BY MOUTH 4 TIMES DAILY AS NEEDED FOR DIARRHEA OR LOOSE STOOLS   Loperamide HCl (IMODIUM PO) Take by mouth as needed.   meloxicam (MOBIC) 7.5 MG tablet TAKE ONE TABLET TWICE DAILY   Misc Natural Products (ALLERGY RELEAF SYSTEM PO) Take by mouth. Reported on 03/20/2016   nystatin cream (MYCOSTATIN) Apply 1 application topically 2 (two) times daily.   rosuvastatin (CRESTOR) 5 MG tablet TAKE 1 TABLET BY MOUTH DAILY   Sod Picosulfate-Mag Ox-Cit Acd (CLENPIQ) 10-3.5-12 MG-GM -GM/160ML SOLN Take 320 mLs by mouth as directed.   tiZANidine (ZANAFLEX) 4 MG tablet TAKE 1 TABLET BY MOUTH AT BEDTIME AS NEEDED FOR MUSCLE SPASMS   torsemide (DEMADEX) 20 MG tablet TAKE 1 TABLET BY MOUTH EVERY DAY AS NEEDED.   No facility-administered encounter medications on file as of 08/03/2021.     Lab Results  Component Value Date   WBC 4.4 03/19/2021   HGB 13.4 03/19/2021   HCT 41.0 03/19/2021   PLT 204 03/19/2021   GLUCOSE 89 03/19/2021   CHOL 169 03/19/2021   TRIG 95 03/19/2021   HDL 48 03/19/2021   LDLCALC 103 (H) 03/19/2021   ALT 16 03/19/2021   AST 17 03/19/2021   NA 139 03/19/2021   K 4.7 03/19/2021   CL 103 03/19/2021   CREATININE 0.95 03/19/2021   BUN 18 03/19/2021   CO2 25 03/19/2021   TSH 0.894 03/19/2021   HGBA1C 5.8 (H) 03/19/2021    MM DIAG BREAST TOMO BILATERAL  Result Date: 07/13/2021 CLINICAL DATA:  65 year old female presenting with intermittent diffuse shooting left breast pain. EXAM: DIGITAL DIAGNOSTIC BILATERAL MAMMOGRAM WITH TOMOSYNTHESIS AND CAD TECHNIQUE: Bilateral digital diagnostic mammography and breast tomosynthesis was performed. The images were evaluated with computer-aided detection. COMPARISON:  Previous exam(s). ACR Breast  Density Category b: There are scattered areas of fibroglandular density. FINDINGS: Right breast: No suspicious mass, distortion, or microcalcifications are identified to suggest presence of malignancy. Left breast: No suspicious mass, distortion, or microcalcifications are identified to suggest presence of malignancy. IMPRESSION: No mammographic evidence of malignancy bilaterally or other finding to explain the patient's intermittent left breast pain. RECOMMENDATION: 1. Clinical follow-up as needed for the left breast pain. Breast pain is a common condition, which will often resolve on its own without intervention. It can be affected by hormonal changes, medication side effect, weight changes and fit of the bra. Pain may also be referred from other adjacent areas of the body. Breast pain may be improved by wearing adequate well-fitting support, over-the-counter topical and oral NSAID medication, low-fat diet, and ice/heat as needed. Studies have shown an improvement in cyclic pain with use of evening primrose oil and vitamin E. 2.  Screening mammogram in one year.(Code:[id number]-B-01Y) I have discussed the findings and recommendations with the patient. If applicable, a reminder letter will be sent to the patient regarding the next appointment. BI-RADS CATEGORY  1: Negative. Electronically Signed   By: Audie Pinto M.D.   On: 07/13/2021 14:53      Assessment & Plan:   Problem List Items Addressed This Visit     Anemia    Follow cbc.       Relevant Orders   CBC with Differential/Platelet   Aortic atherosclerosis ([place])    Continue crestor.        Depression    Continues on citalopram.  Follow.  Diabetes mellitus ([place])    Low carb diet and exercise given blood sugar elevated.  Follow met b and a1c.       Relevant Orders   Hemoglobin A1c   Microalbumin / creatinine urine ratio   Hypercholesteremia    Continue crestor.       Relevant Orders   Hepatic function panel   Lipid panel    Hypertension    Continue on torsemide and ziac.  Blood pressure as outlined - doing well.  Follow pressures.  Follow metabolic panel.       Relevant Orders   Basic metabolic panel   IBS (irritable bowel syndrome)    Symptoms stable.  Scheduled for colonoscopy 08/31/21.        Personal history of renal cell carcinoma    S/p left radical nephrectomy and partial adrenalectomy.  Followed by Dr Tasia Catchings.        Right sided abdominal pain    Right side pain as outlined.  Appears to be more msk in origin.  Twisting - catches.  xrays as outlined.  She is concerned regarding persistent pain.  Discussed further scanning.  D/w Dr Tasia Catchings regarding further w/up and evaluation.        SVT (supraventricular tachycardia) (HCC)    Stable on ziac.         Einar Pheasant, MD

## 2021-08-04 ENCOUNTER — Encounter: Payer: Self-pay | Admitting: Internal Medicine

## 2021-08-04 NOTE — Assessment & Plan Note (Signed)
Follow cbc.  

## 2021-08-04 NOTE — Assessment & Plan Note (Signed)
Continues on citalopram.  Follow.  

## 2021-08-04 NOTE — Assessment & Plan Note (Signed)
Symptoms stable.  Scheduled for colonoscopy 08/31/21.

## 2021-08-04 NOTE — Assessment & Plan Note (Signed)
Stable on ziac.  

## 2021-08-04 NOTE — Assessment & Plan Note (Signed)
Continue crestor 

## 2021-08-04 NOTE — Assessment & Plan Note (Signed)
S/p left radical nephrectomy and partial adrenalectomy.  Followed by Dr Yu.   

## 2021-08-04 NOTE — Assessment & Plan Note (Signed)
Continue on torsemide and ziac.  Blood pressure as outlined - doing well.  Follow pressures.  Follow metabolic panel.  

## 2021-08-04 NOTE — Assessment & Plan Note (Signed)
Low carb diet and exercise given blood sugar elevated.  Follow met b and a1c.  

## 2021-08-04 NOTE — Assessment & Plan Note (Signed)
Right side pain as outlined.  Appears to be more msk in origin.  Twisting - catches.  xrays as outlined.  She is concerned regarding persistent pain.  Discussed further scanning.  D/w Dr Tasia Catchings regarding further w/up and evaluation.

## 2021-08-07 ENCOUNTER — Other Ambulatory Visit: Payer: Self-pay

## 2021-08-07 ENCOUNTER — Telehealth: Payer: Self-pay | Admitting: Oncology

## 2021-08-07 MED ORDER — NA SULFATE-K SULFATE-MG SULF 17.5-3.13-1.6 GM/177ML PO SOLN: 1.0000 | Freq: Once | ORAL | 0 refills | Status: AC

## 2021-08-07 NOTE — Telephone Encounter (Signed)
Pt called and states that she was under the impression that she would only have blood work. Please give her a call to confirm this at [phone]

## 2021-08-08 NOTE — Progress Notes (Signed)
See phone note

## 2021-08-09 ENCOUNTER — Other Ambulatory Visit: Payer: Self-pay | Admitting: Internal Medicine

## 2021-08-20 ENCOUNTER — Encounter: Payer: Self-pay | Admitting: Gastroenterology

## 2021-08-24 ENCOUNTER — Telehealth: Payer: Self-pay

## 2021-08-24 NOTE — Telephone Encounter (Signed)
Patients PCP office called regarding the blood thinner clearance. Office needed clarification for stopping the Plavix. Informed CMA patient will need to stop Plavix 5 days prior to procedure. She asked when can patient restart. I informed her most restart same day or 1 day after. CMA will give information to PCP.

## 2021-08-28 ENCOUNTER — Other Ambulatory Visit: Payer: Self-pay | Admitting: Internal Medicine

## 2021-08-31 ENCOUNTER — Other Ambulatory Visit: Payer: Self-pay

## 2021-08-31 ENCOUNTER — Encounter
Admission: RE | Disposition: A | Discharge status: Home / Self Care | Payer: Self-pay | Source: Home / Self Care | Attending: Gastroenterology

## 2021-08-31 ENCOUNTER — Ambulatory Visit
Admission: RE | Admit: 2021-08-31 | Discharge: 2021-08-31 | Disposition: A | Discharge status: Home / Self Care | Payer: 59 | Attending: Gastroenterology | Admitting: Gastroenterology

## 2021-08-31 ENCOUNTER — Ambulatory Visit: Payer: 59 | Admitting: Anesthesiology

## 2021-08-31 ENCOUNTER — Encounter: Payer: Self-pay | Admitting: Gastroenterology

## 2021-08-31 DIAGNOSIS — Z905 Acquired absence of kidney: Secondary | ICD-10-CM | POA: Diagnosis not present

## 2021-08-31 DIAGNOSIS — K640 First degree hemorrhoids: Secondary | ICD-10-CM | POA: Diagnosis not present

## 2021-08-31 DIAGNOSIS — Z1211 Encounter for screening for malignant neoplasm of colon: Secondary | ICD-10-CM | POA: Diagnosis present

## 2021-08-31 DIAGNOSIS — Z87891 Personal history of nicotine dependence: Secondary | ICD-10-CM | POA: Insufficient documentation

## 2021-08-31 DIAGNOSIS — F32A Depression, unspecified: Secondary | ICD-10-CM | POA: Diagnosis not present

## 2021-08-31 DIAGNOSIS — I4891 Unspecified atrial fibrillation: Secondary | ICD-10-CM | POA: Diagnosis not present

## 2021-08-31 DIAGNOSIS — I69351 Hemiplegia and hemiparesis following cerebral infarction affecting right dominant side: Secondary | ICD-10-CM | POA: Insufficient documentation

## 2021-08-31 DIAGNOSIS — Z7902 Long term (current) use of antithrombotics/antiplatelets: Secondary | ICD-10-CM | POA: Insufficient documentation

## 2021-08-31 DIAGNOSIS — Z86010 Personal history of colonic polyps: Secondary | ICD-10-CM | POA: Diagnosis not present

## 2021-08-31 DIAGNOSIS — I10 Essential (primary) hypertension: Secondary | ICD-10-CM | POA: Insufficient documentation

## 2021-08-31 HISTORY — PX: COLONOSCOPY WITH PROPOFOL: SHX5780

## 2021-08-31 SURGERY — COLONOSCOPY WITH PROPOFOL
Anesthesia: General | Site: Rectum

## 2021-08-31 MED ORDER — ACETAMINOPHEN 160 MG/5ML PO SOLN: 975.0000 mg | Freq: Once | ORAL | Status: DC | PRN

## 2021-08-31 MED ORDER — LACTATED RINGERS IV SOLN: INTRAVENOUS | Status: DC

## 2021-08-31 MED ORDER — PROPOFOL 10 MG/ML IV BOLUS
INTRAVENOUS | Status: DC | PRN
  Administered 2021-08-31: 70 mg via INTRAVENOUS
  Administered 2021-08-31: 30 mg via INTRAVENOUS
  Administered 2021-08-31: 20 mg via INTRAVENOUS
  Administered 2021-08-31 (×2): 30 mg via INTRAVENOUS

## 2021-08-31 MED ORDER — ACETAMINOPHEN 500 MG PO TABS: 1000.0000 mg | ORAL_TABLET | Freq: Once | ORAL | Status: DC | PRN

## 2021-08-31 MED ORDER — STERILE WATER FOR IRRIGATION IR SOLN
Status: DC | PRN
  Administered 2021-08-31: 1

## 2021-08-31 MED ORDER — SODIUM CHLORIDE 0.9 % IV SOLN: INTRAVENOUS | Status: DC

## 2021-08-31 MED ORDER — GLYCOPYRROLATE 0.2 MG/ML IJ SOLN
INTRAMUSCULAR | Status: DC | PRN
  Administered 2021-08-31: .2 mg via INTRAVENOUS

## 2021-08-31 MED ORDER — LIDOCAINE HCL (CARDIAC) PF 100 MG/5ML IV SOSY
PREFILLED_SYRINGE | INTRAVENOUS | Status: DC | PRN
  Administered 2021-08-31: 40 mg via INTRAVENOUS

## 2021-08-31 MED ORDER — ONDANSETRON HCL 4 MG/2ML IJ SOLN: 4.0000 mg | Freq: Once | INTRAMUSCULAR | Status: DC | PRN

## 2021-08-31 SURGICAL SUPPLY — 6 items
GOWN CVR UNV OPN BCK APRN NK (MISCELLANEOUS) ×4 IMPLANT
GOWN ISOL THUMB LOOP REG UNIV (MISCELLANEOUS) ×4
KIT PRC NS LF DISP ENDO (KITS) ×2 IMPLANT
KIT PROCEDURE OLYMPUS (KITS) ×2
MANIFOLD NEPTUNE II (INSTRUMENTS) ×3 IMPLANT
WATER STERILE IRR 250ML POUR (IV SOLUTION) ×3 IMPLANT

## 2021-08-31 NOTE — H&P (Signed)
Jacqueline Lame, MD [address] [place], [place] [address] Phone:[phone] Fax : [phone]  Primary Care Physician:  Jacqueline Pheasant, MD Primary Gastroenterologist:  Dr. Allen Nichols  Pre-Procedure History & Physical: HPI:  Jacqueline Nichols is a 65 y.o. female is here for an colonoscopy.   Past Medical History:  Diagnosis Date   Cancer ([place])    skin   Chicken pox    Colon polyps    Depression    Diabetes mellitus (HCC)    GERD (gastroesophageal reflux disease)    Hyperlipidemia    Hypertension    Stroke [facility])    brainstem   Urinary incontinence     Past Surgical History:  Procedure Laterality Date   CHOLECYSTECTOMY  2001   COLONOSCOPY WITH PROPOFOL N/A 03/27/2016   Procedure: COLONOSCOPY WITH PROPOFOL;  Surgeon: Jacqueline Lame, MD;  Location: [facility] ENDOSCOPY;  Service: Endoscopy;  Laterality: N/A;   KNEE SURGERY  2003-2009   s/p 8 knee surgeries (with 3 knee replacements)   NEPHRECTOMY RADICAL  44/9675   NISSEN FUNDOPLICATION      Prior to Admission medications   Medication Sig Start Date End Date Taking? Authorizing Provider  bisoprolol-hydrochlorothiazide (ZIAC) 2.5-6.25 MG tablet TAKE 1 TABLET BY MOUTH DAILY FOR HIGH BLOOD PRESSURE 09/21/19  Yes Jacqueline Pheasant, MD  butalbital-acetaminophen-caffeine (FIORICET, ESGIC) [phone] MG tablet TAKE ONE TABLET EACH DAY AS NEEDED FOR HEADACHE 06/26/17  Yes Jacqueline Pheasant, MD  citalopram (CELEXA) 40 MG tablet TAKE 1 TABLET BY MOUTH DAILY 06/07/21  Yes Jacqueline Pheasant, MD  clonazePAM (KLONOPIN) 0.5 MG tablet TAKE 1 TABLET BY MOUTH EVERY DAY AS NEEDED 06/07/21  Yes Jacqueline Pheasant, MD  clopidogrel (PLAVIX) 75 MG tablet TAKE 1 TABLET BY MOUTH EVERY DAY 08/09/21  Yes Jacqueline Pheasant, MD  diphenoxylate-atropine (LOMOTIL) 2.5-0.025 MG tablet TAKE 1 TABLET BY MOUTH 4 TIMES DAILY AS NEEDED FOR DIARRHEA OR LOOSE STOOLS 12/17/19  Yes Jacqueline Lame, MD  Loperamide HCl (IMODIUM PO) Take by mouth as needed.   Yes [provider]   meloxicam (MOBIC) 7.5 MG tablet TAKE ONE TABLET TWICE DAILY 10/19/15  Yes Jacqueline Pheasant, MD  Misc Natural Products (ALLERGY RELEAF SYSTEM PO) Take by mouth. Reported on 03/20/2016   Yes [provider]  nystatin cream (MYCOSTATIN) Apply 1 application topically 2 (two) times daily. 12/20/20  Yes Jacqueline Pheasant, MD  rosuvastatin (CRESTOR) 5 MG tablet TAKE 1 TABLET BY MOUTH DAILY 01/31/21  Yes Jacqueline Pheasant, MD  tiZANidine (ZANAFLEX) 4 MG tablet TAKE 1 TABLET BY MOUTH AT BEDTIME AS NEEDED FOR MUSCLE SPASMS 08/28/21  Yes Jacqueline Pheasant, MD  torsemide (DEMADEX) 20 MG tablet TAKE 1 TABLET BY MOUTH EVERY DAY AS NEEDED. 11/06/18  Yes Jacqueline Pheasant, MD    Allergies as of 08/03/2021 - Review Complete 08/03/2021  Allergen Reaction Noted   Codeine Nausea Only 10/21/2012   Vicodin [hydrocodone-acetaminophen] Itching 11/20/2012    Family History  Problem Relation Age of Onset   Colon cancer Mother        died - age 54   Heart disease Father    Hypertension Father    Lymphoma Father    Arthritis/Rheumatoid Sister    Lung cancer Brother        smoked   Arthritis/Rheumatoid Paternal Grandmother    Cancer Maternal Uncle        Unknown type of cancer    Cancer Paternal Aunt        Unknown type of cancer    Throat cancer Paternal  Uncle    Colon cancer Paternal Uncle    Breast cancer Neg Hx     Social History   Socioeconomic History   Marital status: Divorced    Spouse name: Not on file   Number of children: 2   Years of education: Not on file   Highest education level: Not on file  Occupational History   Not on file  Tobacco Use   Smoking status: Former    Packs/day: 1.00    Years: 15.00    Pack years: 15.00    Types: Cigarettes    Quit date: 10/23/2012    Years since quitting: 8.8   Smokeless tobacco: Never   Tobacco comments:    Quit Feb 2014  Vaping Use   Vaping Use: Former   Start date: 02/20/2013   Quit date: 02/20/2014  Substance and Sexual Activity   Alcohol  use: Yes    Alcohol/week: 1.0 standard drink    Types: 1 Glasses of wine per week    Comment: social drinking - approx 2-4 monthly    Drug use: No   Sexual activity: Not Currently  Other Topics Concern   Not on file  Social History Narrative   Not on file   Social Determinants of Health   Financial Resource Strain: Not on file  Food Insecurity: Not on file  Transportation Needs: Not on file  Physical Activity: Not on file  Stress: Not on file  Social Connections: Not on file  Intimate Partner Violence: Not on file    Review of Systems: See HPI, otherwise negative ROS  Physical Exam: BP 135/61    Pulse (!) 55    Temp (!) 97 F (36.1 C) (Temporal)    Resp 18    Ht 5\' 10"  (1.778 m)    Wt 106.6 kg    SpO2 97%    BMI 33.72 kg/m  General:   Alert,  pleasant and cooperative in NAD Head:  Normocephalic and atraumatic. Neck:  Supple; no masses or thyromegaly. Lungs:  Clear throughout to auscultation.    Heart:  Regular rate and rhythm. Abdomen:  Soft, nontender and nondistended. Normal bowel sounds, without guarding, and without rebound.   Neurologic:  Alert and  oriented x4;  grossly normal neurologically.  Impression/Plan: Jacqueline Nichols is here for an colonoscopy to be performed for a history of adenomatous polyps on 2017    Risks, benefits, limitations, and alternatives regarding  colonoscopy have been reviewed with the patient.  Questions have been answered.  All parties agreeable.   Jacqueline Lame, MD  08/31/2021, 9:02 AM

## 2021-08-31 NOTE — Anesthesia Procedure Notes (Signed)
Date/Time: 08/31/2021 9:08 AM Performed by: Dionne Bucy, CRNA Pre-anesthesia Checklist: Patient identified, Emergency Drugs available, Suction available, Patient being monitored and Timeout performed Patient Re-evaluated:Patient Re-evaluated prior to induction Oxygen Delivery Method: Nasal cannula Placement Confirmation: positive ETCO2

## 2021-08-31 NOTE — Transfer of Care (Signed)
Immediate Anesthesia Transfer of Care Note  Patient: Jacqueline Nichols  Procedure(s) Performed: COLONOSCOPY WITH PROPOFOL (Rectum)  Patient Location: PACU  Anesthesia Type: General  Level of Consciousness: awake, alert  and patient cooperative  Airway and Oxygen Therapy: Patient Spontanous Breathing and Patient connected to supplemental oxygen  Post-op Assessment: Post-op Vital signs reviewed, Patient's Cardiovascular Status Stable, Respiratory Function Stable, Patent Airway and No signs of Nausea or vomiting  Post-op Vital Signs: Reviewed and stable  Complications: No notable events documented.

## 2021-08-31 NOTE — Anesthesia Postprocedure Evaluation (Signed)
Anesthesia Post Note  Patient: Jacqueline Nichols  Procedure(s) Performed: COLONOSCOPY WITH PROPOFOL (Rectum)     Patient location during evaluation: PACU Anesthesia Type: General Level of consciousness: awake and alert Pain management: pain level controlled Vital Signs Assessment: post-procedure vital signs reviewed and stable Respiratory status: spontaneous breathing, nonlabored ventilation and respiratory function stable Cardiovascular status: blood pressure returned to baseline and stable Postop Assessment: no apparent nausea or vomiting Anesthetic complications: no   No notable events documented.  April Manson

## 2021-08-31 NOTE — Anesthesia Preprocedure Evaluation (Addendum)
Anesthesia Evaluation  Patient identified by MRN, date of birth, ID band Patient awake    Reviewed: Allergy & Precautions, H&P , NPO status , Patient's Chart, lab work & pertinent test results, reviewed documented beta blocker date and time   Airway Mallampati: I  TM Distance: >3 FB Neck ROM: full    Dental no notable dental hx.    Pulmonary neg pulmonary ROS, former smoker,    Pulmonary exam normal breath sounds clear to auscultation       Cardiovascular Exercise Tolerance: Good hypertension, + dysrhythmias (afib, s/p ablation, no AC just on plavix for CVA prophylaxis (held x 5 days)) Atrial Fibrillation  Rhythm:regular Rate:Normal     Neuro/Psych  Headaches, PSYCHIATRIC DISORDERS Depression CVA (right side weakness), Residual Symptoms negative psych ROS   GI/Hepatic negative GI ROS, Neg liver ROS, GERD  ,  Endo/Other  negative endocrine ROSdiabetes  Renal/GU Renal disease (h/o nephrectomy)negative Renal ROS  negative genitourinary   Musculoskeletal   Abdominal   Peds  Hematology negative hematology ROS (+)   Anesthesia Other Findings   Reproductive/Obstetrics negative OB ROS                            Anesthesia Physical Anesthesia Plan  ASA: 3  Anesthesia Plan: General   Post-op Pain Management:    Induction:   PONV Risk Score and Plan: 3 and Propofol infusion, TIVA and Treatment may vary due to age or medical condition  Airway Management Planned:   Additional Equipment:   Intra-op Plan:   Post-operative Plan:   Informed Consent: I have reviewed the patients History and Physical, chart, labs and discussed the procedure including the risks, benefits and alternatives for the proposed anesthesia with the patient or authorized representative who has indicated his/her understanding and acceptance.     Dental Advisory Given  Plan Discussed with: CRNA  Anesthesia Plan  Comments:        Anesthesia Quick Evaluation

## 2021-08-31 NOTE — Op Note (Signed)
[facility] Gastroenterology Patient Name: Jacqueline Nichols Procedure Date: 08/31/2021 9:05 AM MRN: [id number] Account #: [id number] Date of Birth: May 07, 1956 Admit Type: Outpatient Age: 65 Room: [facility] OR ROOM 01 Gender: Female Note Status: Finalized Instrument Name: [id number] Procedure:             Colonoscopy Indications:           Screening for colorectal malignant neoplasm Providers:             Lucilla Lame MD, MD Referring MD:          Einar Pheasant, MD (Referring MD) Medicines:             Propofol per Anesthesia Complications:         No immediate complications. Procedure:             Pre-Anesthesia Assessment:                        - Prior to the procedure, a History and Physical was                         performed, and patient medications and allergies were                         reviewed. The patient's tolerance of previous                         anesthesia was also reviewed. The risks and benefits                         of the procedure and the sedation options and risks                         were discussed with the patient. All questions were                         answered, and informed consent was obtained. Prior                         Anticoagulants: The patient has taken no previous                         anticoagulant or antiplatelet agents. ASA Grade                         Assessment: II - A patient with mild systemic disease.                         After reviewing the risks and benefits, the patient                         was deemed in satisfactory condition to undergo the                         procedure.                        After obtaining informed consent, the colonoscope was  passed under direct vision. Throughout the procedure,                         the patient's blood pressure, pulse, and oxygen                         saturations were monitored continuously. The was                          introduced through the anus and advanced to the the                         cecum, identified by appendiceal orifice and ileocecal                         valve. The colonoscopy was performed without                         difficulty. The patient tolerated the procedure well.                         The quality of the bowel preparation was excellent. Findings:      The perianal and digital rectal examinations were normal.      Non-bleeding internal hemorrhoids were found during retroflexion. The       hemorrhoids were Grade I (internal hemorrhoids that do not prolapse). Impression:            - Non-bleeding internal hemorrhoids.                        - No specimens collected. Recommendation:        - Discharge patient to home.                        - Resume previous diet.                        - Continue present medications.                        - Repeat colonoscopy in 7 years for surveillance. Procedure Code(s):     --- Professional ---                        [phone], Colonoscopy, flexible; diagnostic, including                         collection of specimen(s) by brushing or washing, when                         performed (separate procedure) Diagnosis Code(s):     --- Professional ---                        Z12.11, Encounter for screening for malignant neoplasm                         of colon CPT copyright 2019 American Medical Association. All rights reserved. The codes documented in this report are preliminary and upon coder review may  be revised to meet current compliance  requirements. Lucilla Lame MD, MD 08/31/2021 9:29:07 AM This report has been signed electronically. Number of Addenda: 0 Note Initiated On: 08/31/2021 9:05 AM Scope Withdrawal Time: 0 hours 7 minutes 53 seconds  Total Procedure Duration: 0 hours 13 minutes 50 seconds  Estimated Blood Loss:  Estimated blood loss: none.      [facility]

## 2021-09-18 ENCOUNTER — Encounter: Payer: Self-pay | Admitting: *Deleted

## 2021-09-25 ENCOUNTER — Inpatient Hospital Stay: Payer: 59 | Attending: Oncology

## 2021-09-25 ENCOUNTER — Other Ambulatory Visit: Payer: Self-pay

## 2021-09-25 DIAGNOSIS — Z85528 Personal history of other malignant neoplasm of kidney: Secondary | ICD-10-CM | POA: Insufficient documentation

## 2021-09-25 LAB — CBC WITH DIFFERENTIAL/PLATELET
Abs Immature Granulocytes: 0.03 10*3/uL (ref 0.00–0.07)
Basophils Absolute: 0 10*3/uL (ref 0.0–0.1)
Basophils Relative: 0 %
Eosinophils Absolute: 0.2 10*3/uL (ref 0.0–0.5)
Eosinophils Relative: 2 %
HCT: 42 % (ref 36.0–46.0)
Hemoglobin: 13.8 g/dL (ref 12.0–15.0)
Immature Granulocytes: 0 %
Lymphocytes Relative: 24 %
Lymphs Abs: 1.7 10*3/uL (ref 0.7–4.0)
MCH: 29.6 pg (ref 26.0–34.0)
MCHC: 32.9 g/dL (ref 30.0–36.0)
MCV: 89.9 fL (ref 80.0–100.0)
Monocytes Absolute: 0.4 10*3/uL (ref 0.1–1.0)
Monocytes Relative: 6 %
Neutro Abs: 4.6 10*3/uL (ref 1.7–7.7)
Neutrophils Relative %: 68 %
Platelets: 216 10*3/uL (ref 150–400)
RBC: 4.67 MIL/uL (ref 3.87–5.11)
RDW: 12.6 % (ref 11.5–15.5)
WBC: 7 10*3/uL (ref 4.0–10.5)
nRBC: 0 % (ref 0.0–0.2)

## 2021-09-25 LAB — COMPREHENSIVE METABOLIC PANEL
ALT: 25 U/L (ref 0–44)
AST: 32 U/L (ref 15–41)
Albumin: 4.3 g/dL (ref 3.5–5.0)
Alkaline Phosphatase: 77 U/L (ref 38–126)
Anion gap: 10 (ref 5–15)
BUN: 20 mg/dL (ref 8–23)
CO2: 27 mmol/L (ref 22–32)
Calcium: 9.9 mg/dL (ref 8.9–10.3)
Chloride: 98 mmol/L (ref 98–111)
Creatinine, Ser: 0.92 mg/dL (ref 0.44–1.00)
GFR, Estimated: >60 mL/min (ref >60)
Glucose, Bld: 98 mg/dL (ref 70–99)
Potassium: 4.5 mmol/L (ref 3.5–5.1)
Sodium: 135 mmol/L (ref 135–145)
Total Bilirubin: 0.4 mg/dL (ref 0.3–1.2)
Total Protein: 7.1 g/dL (ref 6.5–8.1)

## 2021-09-25 LAB — LACTATE DEHYDROGENASE: LDH: 140 U/L (ref 98–192)

## 2021-09-27 ENCOUNTER — Inpatient Hospital Stay: Payer: 59 | Attending: Oncology | Admitting: Oncology

## 2021-09-27 ENCOUNTER — Other Ambulatory Visit: Payer: Self-pay

## 2021-09-27 ENCOUNTER — Encounter: Payer: Self-pay | Admitting: Oncology

## 2021-09-27 VITALS — BP 131/79 | HR 53 | Temp 97.0°F | Resp 14 | Wt 238.0 lb

## 2021-09-27 DIAGNOSIS — G8929 Other chronic pain: Secondary | ICD-10-CM | POA: Diagnosis not present

## 2021-09-27 DIAGNOSIS — M545 Low back pain, unspecified: Secondary | ICD-10-CM | POA: Diagnosis not present

## 2021-09-27 DIAGNOSIS — Z85528 Personal history of other malignant neoplasm of kidney: Secondary | ICD-10-CM | POA: Diagnosis not present

## 2021-09-27 NOTE — Progress Notes (Signed)
Hematology/Oncology follow up note  [phone] [phone]   Patient Care Team: Einar Pheasant, MD as PCP - General (Internal Medicine) [name], MD as Consulting Physician (Hematology and Oncology)  REFERRING PROVIDER: Einar Pheasant, MD REASON FOR VISIT Follow up for treatment of renal cell carcinoma  HISTORY OF PRESENTING ILLNESS:  Jacqueline Nichols is a  66 y.o.  female with PMH listed below who was referred to me for evaluation of history of renal cell carcinoma Patient follows up with [facility] urology and want to switch her care to local. Extensive medical chart review was performed by me. History of Stage II left renal cell carcinoma.  In 2016, she was found to have a left renal mass 7.9 cm x 2.6 cm x 9.3 cm.  Underwent laparoscopic left radical nephrectomy with Dr.Matthew Reyna on 07/18/2015.  Per Note in care everywhere, patient has clear cell renal cell carcinoma, ISU P nuclear grade 2, pT2a, margins of resections are negative for tumor, no metastatic carcinoma identified in 1 hilar lymph nodes (0/1), background kidney with scattered sclerotic glomeruli; histologically unremarkable adrenal gland.  Patient is currently 2 and 1/2 years after surgery.  She has been having surveillance ultrasound every 6 months.  Today patient denies any cough, weight loss, hematuria, flank pain. Former smoker  INTERVAL HISTORY Jacqueline Nichols is a 66 y.o. female who has above history reviewed by me today presents for follow up visit for management of history of renal cell carcinoma  Patient reports back pain which is chronic, ongoing for the past 2-1/2 years, located lower back, to the right side.  Intermittent, triggered by certain position.  No lower extremity weakness.    Review of Systems  Constitutional:  Negative for chills, fever, malaise/fatigue and weight loss.  HENT:  Negative for sore throat.   Eyes:  Negative for redness.  Respiratory:  Negative for cough,  shortness of breath and wheezing.   Cardiovascular:  Negative for chest pain, palpitations and leg swelling.  Gastrointestinal:  Negative for abdominal pain, blood in stool, nausea and vomiting.  Genitourinary:  Negative for dysuria.  Musculoskeletal:  Positive for back pain. Negative for myalgias.  Skin:  Negative for rash.  Neurological:  Negative for dizziness, tingling and tremors.  Endo/Heme/Allergies:  Does not bruise/bleed easily.  Psychiatric/Behavioral:  Negative for hallucinations.    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   Urinary incontinence     SURGICAL HISTORY: Past Surgical History:  Procedure Laterality Date   CHOLECYSTECTOMY  2001   COLONOSCOPY WITH PROPOFOL N/A 03/27/2016   Procedure: COLONOSCOPY WITH PROPOFOL;  Surgeon: Lucilla Lame, MD;  Location: [facility] ENDOSCOPY;  Service: Endoscopy;  Laterality: N/A;   COLONOSCOPY WITH PROPOFOL N/A 08/31/2021   Procedure: COLONOSCOPY WITH PROPOFOL;  Surgeon: Lucilla Lame, MD;  Location: [place];  Service: Endoscopy;  Laterality: N/A;   KNEE SURGERY  2003-2009   s/p 8 knee surgeries (with 3 knee replacements)   NEPHRECTOMY RADICAL  [id number]   NISSEN FUNDOPLICATION      SOCIAL HISTORY: Social History   Socioeconomic History   Marital status: Divorced    Spouse name: Not on file  Number of children: 2   Years of education: Not on file   Highest education level: Not on file  Occupational History   Not on file  Tobacco Use   Smoking status: Former    Packs/day: 1.00    Years: 15.00    Pack years: 15.00    Types: Cigarettes    Quit date: 10/23/2012    Years since quitting: 8.9   Smokeless tobacco: Never   Tobacco comments:    Quit Feb 2014  Vaping Use    Vaping Use: Former   Start date: 02/20/2013   Quit date: 02/20/2014  Substance and Sexual Activity   Alcohol use: Yes    Alcohol/week: 1.0 standard drink    Types: 1 Glasses of wine per week    Comment: social drinking - approx 2-4 monthly    Drug use: No   Sexual activity: Not Currently  Other Topics Concern   Not on file  Social History Narrative   Not on file   Social Determinants of Health   Financial Resource Strain: Not on file  Food Insecurity: Not on file  Transportation Needs: Not on file  Physical Activity: Not on file  Stress: Not on file  Social Connections: Not on file  Intimate Partner Violence: Not on file    FAMILY HISTORY: Family History  Problem Relation Age of Onset   Colon cancer Mother        died - age 67   Heart disease Father    Hypertension Father    Lymphoma Father    Arthritis/Rheumatoid Sister    Lung cancer Brother        smoked   Arthritis/Rheumatoid Paternal Grandmother    Cancer Maternal Uncle        Unknown type of cancer    Cancer Paternal Aunt        Unknown type of cancer    Throat cancer Paternal Uncle    Colon cancer Paternal Uncle    Breast cancer Neg Hx     ALLERGIES:  is allergic to codeine and vicodin [hydrocodone-acetaminophen].  MEDICATIONS:  Current Outpatient Medications  Medication Sig Dispense Refill   bisoprolol-hydrochlorothiazide (ZIAC) 2.5-6.25 MG tablet TAKE 1 TABLET BY MOUTH DAILY FOR HIGH BLOOD PRESSURE 90 tablet 1   butalbital-acetaminophen-caffeine (FIORICET, ESGIC) 50-325-40 MG tablet TAKE ONE TABLET EACH DAY AS NEEDED FOR HEADACHE 15 tablet 0   citalopram (CELEXA) 40 MG tablet TAKE 1 TABLET BY MOUTH DAILY 90 tablet 1   clonazePAM (KLONOPIN) 0.5 MG tablet TAKE 1 TABLET BY MOUTH EVERY DAY AS NEEDED 30 tablet 1   clopidogrel (PLAVIX) 75 MG tablet TAKE 1 TABLET BY MOUTH EVERY DAY 90 tablet 1   diphenoxylate-atropine (LOMOTIL) 2.5-0.025 MG tablet TAKE 1 TABLET BY MOUTH 4 TIMES DAILY AS NEEDED FOR DIARRHEA OR  LOOSE STOOLS 120 tablet 5   Loperamide HCl (IMODIUM PO) Take by mouth as needed.     meloxicam (MOBIC) 7.5 MG tablet TAKE ONE TABLET TWICE DAILY 60 tablet 5   Misc Natural Products (ALLERGY RELEAF SYSTEM PO) Take by mouth. Reported on 03/20/2016     nystatin cream (MYCOSTATIN) Apply 1 application topically 2 (two) times daily. 30 g 0   rosuvastatin (CRESTOR) 5 MG tablet TAKE 1 TABLET BY MOUTH DAILY 90 tablet 1   tiZANidine (ZANAFLEX) 4 MG tablet TAKE 1 TABLET BY MOUTH AT BEDTIME AS NEEDED FOR MUSCLE SPASMS 30 tablet 1   torsemide (DEMADEX) 20 MG tablet TAKE 1 TABLET BY MOUTH EVERY DAY AS  NEEDED. 90 tablet 1   No current facility-administered medications for this visit.     PHYSICAL EXAMINATION: ECOG PERFORMANCE STATUS: 0 - Asymptomatic Vitals:   09/27/21 1024  BP: 131/79  Pulse: (!) 53  Resp: 14  Temp: (!) 97 F (36.1 C)  SpO2: 96%   Filed Weights   09/27/21 1024  Weight: 238 lb (108 kg)    Physical Exam Constitutional:      General: She is not in acute distress. HENT:     Head: Normocephalic and atraumatic.  Eyes:     General: No scleral icterus.    Conjunctiva/sclera: Conjunctivae normal.     Pupils: Pupils are equal, round, and reactive to light.  Cardiovascular:     Rate and Rhythm: Normal rate and regular rhythm.     Heart sounds: Normal heart sounds.  Pulmonary:     Effort: Pulmonary effort is normal. No respiratory distress.     Breath sounds: Normal breath sounds. No wheezing or rales.  Chest:     Chest wall: No tenderness.  Abdominal:     General: Bowel sounds are normal. There is no distension.     Palpations: Abdomen is soft. There is no mass.     Tenderness: There is no abdominal tenderness.  Musculoskeletal:        General: No deformity. Normal range of motion.     Cervical back: Normal range of motion and neck supple.  Lymphadenopathy:     Cervical: No cervical adenopathy.  Skin:    General: Skin is warm and dry.     Findings: No erythema or rash.   Neurological:     Mental Status: She is alert and oriented to person, place, and time. Mental status is at baseline.     Cranial Nerves: No cranial nerve deficit.     Coordination: Coordination normal.  Psychiatric:        Mood and Affect: Mood normal.        Behavior: Behavior normal.        Thought Content: Thought content normal.     LABORATORY DATA:  I have reviewed the data as listed Lab Results  Component Value Date   WBC 7.0 09/25/2021   HGB 13.8 09/25/2021   HCT 42.0 09/25/2021   MCV 89.9 09/25/2021   PLT 216 09/25/2021   Recent Labs    03/19/21 0855 09/25/21 1047  NA 139 135  K 4.7 4.5  CL 103 98  CO2 25 27  GLUCOSE 89 98  BUN 18 20  CREATININE 0.95 0.92  CALCIUM 9.6 9.9  GFRNONAA  --  >60  PROT 6.2 7.1  ALBUMIN 4.2 4.3  AST 17 32  ALT 16 25  ALKPHOS 98 77  BILITOT 0.5 0.4  BILIDIR <0.10  --     Iron/TIBC/Ferritin/ %Sat    Component Value Date/Time   IRON 68 08/14/2018 1341   TIBC 337 08/14/2018 1341   FERRITIN 26 08/14/2018 1341   IRONPCTSAT 20 08/14/2018 1341     Reviewed lab work which was done at PCPs office on 02/03/2018. Creatinine 0.83, sodium 139, potassium 4.3, calcium 9.7, bilirubin 0.4, AST 14 ALT 11 WBC 6, hemoglobin 10.9, MCV 79, platelet 223,000, normal differential. Ferritin 11  ASSESSMENT & PLAN:  1. History of kidney cancer   2. Chronic right-sided low back pain without sciatica    # History of Stage II renal cell carcinoma, radical nephrectomy 07/24/2015  09/21/2020 CT showed no evidence of disease recurrence or metastatic disease. It  has been slightly more than 6 years since her nephrectomy surgery. Labs reviewed and discussed with patient.  Stable LDH. Discussed about future images will be clinically indicated. Recommend follow-up in 1 year.  Chronic back pain,?  Musculoskeletal Recommend MRI lumbar spine with and without contrast for further evaluation. Patient is on Zanaflex, I recommend patient to 4 mg twice  daily.  Orders Placed This Encounter  Procedures   MR Lumbar Spine W Wo Contrast    Standing Status:   Future    Standing Expiration Date:   09/27/2022    Order Specific Question:   If indicated for the ordered procedure, I authorize the administration of contrast media per Radiology protocol    Answer:   Yes    Order Specific Question:   What is the patient's sedation requirement?    Answer:   No Sedation    Order Specific Question:   Does the patient have a pacemaker or implanted devices?    Answer:   No    Order Specific Question:   Use SRS Protocol?    Answer:   Yes    Order Specific Question:   Preferred imaging location?    Answer:   [facility] (table limit - 550lbs)   CBC with Differential/Platelet    Standing Status:   Future    Standing Expiration Date:   09/27/2022   Comprehensive metabolic panel    Standing Status:   Future    Standing Expiration Date:   09/27/2022   Lactate dehydrogenase    Standing Status:   Future    Standing Expiration Date:   09/27/2022    All questions were answered. The patient knows to call the clinic with any problems questions or concerns.  Return of visit: 1 year.   [name], MD, PhD 09/27/2021

## 2021-09-27 NOTE — Progress Notes (Signed)
Pt in for follow up, denies any concerns today. 

## 2021-10-08 ENCOUNTER — Ambulatory Visit
Admission: RE | Admit: 2021-10-08 | Discharge: 2021-10-08 | Disposition: A | Discharge status: Home / Self Care | Payer: 59 | Source: Ambulatory Visit | Attending: Oncology | Admitting: Oncology

## 2021-10-08 ENCOUNTER — Other Ambulatory Visit: Payer: Self-pay

## 2021-10-08 DIAGNOSIS — Z85528 Personal history of other malignant neoplasm of kidney: Secondary | ICD-10-CM | POA: Insufficient documentation

## 2021-10-08 MED ORDER — GADOBUTROL 1 MMOL/ML IV SOLN
10.0000 mL | Freq: Once | INTRAVENOUS | Status: AC | PRN
  Administered 2021-10-08: 10 mL via INTRAVENOUS

## 2021-10-15 ENCOUNTER — Telehealth: Payer: Self-pay | Admitting: *Deleted

## 2021-10-15 NOTE — Telephone Encounter (Signed)
Please advise 

## 2021-10-15 NOTE — Telephone Encounter (Signed)
Patient called asking for results, She has no follow up appointment until Jan 2024.  IMPRESSION: 1. 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 2. Noncompressive foraminal narrowing primarily at L3-4 and L4-5.     Electronically Signed   By: Jorje Guild M.D.   On: 10/09/2021 14:23

## 2021-10-16 ENCOUNTER — Telehealth: Payer: Self-pay | Admitting: Internal Medicine

## 2021-10-16 NOTE — Telephone Encounter (Signed)
Please review MRI in epic and my chart from Dr Tasia Catchings

## 2021-10-16 NOTE — Telephone Encounter (Signed)
Pt called in requesting results. Pt stated that she had a MRI a week ago on Monday. Pt stated that she had called her oncology about her results. Pt stated that the oncology doctor sent her a mychart message back. Oncology doctor advise pt if she doesn't understand the result to please reach out to her PCP. Pt requesting callback

## 2021-10-16 NOTE — Telephone Encounter (Signed)
Notify - MRI reveals degenerative changes (arthritis changes) and some mild to moderate narrowing of spinal canal.  Changes c/w arthritis.  If persistent increased pain, can refer to ortho and/or PT for further evaluation and treatment.

## 2021-10-17 ENCOUNTER — Other Ambulatory Visit: Payer: Self-pay | Admitting: Internal Medicine

## 2021-10-17 NOTE — Telephone Encounter (Signed)
Patient returned office phone call for results 

## 2021-10-17 NOTE — Telephone Encounter (Signed)
LMTCB

## 2021-10-17 NOTE — Telephone Encounter (Signed)
Patient is aware of below and would like to hold off on ortho and PT right now.

## 2021-10-18 NOTE — Telephone Encounter (Signed)
LOV: Dec 2022

## 2021-10-19 NOTE — Telephone Encounter (Signed)
Rx ok;d for clonazepam #30 with one refill.  

## 2021-10-25 ENCOUNTER — Telehealth: Payer: Self-pay | Admitting: Internal Medicine

## 2021-10-25 NOTE — Telephone Encounter (Signed)
Patient would like to have some blood work done. She wanted to speak [place] about it.

## 2021-10-26 NOTE — Telephone Encounter (Signed)
Patient returned office phone call. She did not want to schedule a lab appointment until she spoke to [place].

## 2021-10-26 NOTE — Telephone Encounter (Signed)
Patient wants to have her hormones checked for menopause and wants to have a test done to know if she is insulin resistant. Patient is aware you are out of office until next week. This is not urgent.

## 2021-10-27 ENCOUNTER — Other Ambulatory Visit: Payer: Self-pay | Admitting: Internal Medicine

## 2021-10-27 DIAGNOSIS — E1165 Type 2 diabetes mellitus with hyperglycemia: Secondary | ICD-10-CM

## 2021-10-27 DIAGNOSIS — E7800 Pure hypercholesterolemia, unspecified: Secondary | ICD-10-CM

## 2021-10-27 DIAGNOSIS — I10 Essential (primary) hypertension: Secondary | ICD-10-CM

## 2021-10-27 DIAGNOSIS — E559 Vitamin D deficiency, unspecified: Secondary | ICD-10-CM

## 2021-10-27 DIAGNOSIS — E538 Deficiency of other specified B group vitamins: Secondary | ICD-10-CM

## 2021-10-27 DIAGNOSIS — R748 Abnormal levels of other serum enzymes: Secondary | ICD-10-CM

## 2021-10-27 NOTE — Telephone Encounter (Signed)
Labs ordered.

## 2021-10-27 NOTE — Progress Notes (Signed)
Labs ordered.

## 2021-10-29 NOTE — Progress Notes (Signed)
Labs changed to [facility] per patient,

## 2021-10-29 NOTE — Telephone Encounter (Signed)
Patient called Jacqueline Nichols back. She would like lab orders sent to [facility].

## 2021-10-29 NOTE — Telephone Encounter (Signed)
Labs changed to [facility] per patient request. Patient is aware and will go end of week.

## 2021-10-29 NOTE — Addendum Note (Signed)
Addended by: Lars Masson on: 10/29/2021 09:19 AM   Modules accepted: Orders

## 2021-10-29 NOTE — Telephone Encounter (Signed)
LMTCB. Will need fasting lab appt or let me know if needs to go to [facility]

## 2021-10-31 ENCOUNTER — Other Ambulatory Visit: Payer: Self-pay | Admitting: Internal Medicine

## 2021-11-02 LAB — MICROALBUMIN / CREATININE URINE RATIO
Creatinine, Urine: 88.3 mg/dL
Microalb/Creat Ratio: 4 mg/g creat (ref 0–29)
Microalbumin, Urine: 3.2 ug/mL

## 2021-11-02 LAB — BASIC METABOLIC PANEL
BUN/Creatinine Ratio: 14 (ref 12–28)
BUN: 13 mg/dL (ref 8–27)
CO2: 23 mmol/L (ref 20–29)
Calcium: 10 mg/dL (ref 8.7–10.3)
Chloride: 102 mmol/L (ref 96–106)
Creatinine, Ser: 0.92 mg/dL (ref 0.57–1.00)
Glucose: 96 mg/dL (ref 70–99)
Potassium: 4.8 mmol/L (ref 3.5–5.2)
Sodium: 140 mmol/L (ref 134–144)
eGFR: 70 mL/min/{1.73_m2} (ref >59)

## 2021-11-02 LAB — LIPID PANEL
Chol/HDL Ratio: 3.2 ratio (ref 0.0–4.4)
Cholesterol, Total: 168 mg/dL (ref 100–199)
HDL: 52 mg/dL (ref >39)
LDL Chol Calc (NIH): 90 mg/dL (ref 0–99)
Triglycerides: 152 mg/dL — ABNORMAL HIGH (ref 0–149)
VLDL Cholesterol Cal: 26 mg/dL (ref 5–40)

## 2021-11-02 LAB — HEPATIC FUNCTION PANEL
ALT: 23 IU/L (ref 0–32)
AST: 28 IU/L (ref 0–40)
Albumin: 4.3 g/dL (ref 3.8–4.8)
Alkaline Phosphatase: 99 IU/L (ref 44–121)
Bilirubin Total: 0.5 mg/dL (ref 0.0–1.2)
Bilirubin, Direct: 0.15 mg/dL (ref 0.00–0.40)
Total Protein: 6.1 g/dL (ref 6.0–8.5)

## 2021-11-02 LAB — VITAMIN B12: Vitamin B-12: 621 pg/mL (ref 232–1245)

## 2021-11-02 LAB — HEMOGLOBIN A1C
Est. average glucose Bld gHb Est-mCnc: 111 mg/dL
Hgb A1c MFr Bld: 5.5 % (ref 4.8–5.6)

## 2021-11-02 LAB — INSULIN, RANDOM: INSULIN: 19.3 u[IU]/mL (ref 2.6–24.9)

## 2021-11-02 LAB — FOLLICLE STIMULATING HORMONE: FSH: 74.3 m[IU]/mL

## 2021-11-02 LAB — TSH: TSH: 1.09 u[IU]/mL (ref 0.450–4.500)

## 2021-11-13 IMAGING — MG DIGITAL DIAGNOSTIC BILAT W/ TOMO W/ CAD
8 series · 8 of 24 positions shown · non-contrast
Comparison: Previous exam(s).

CLINICAL DATA: 65-year-old female presenting with intermittent
diffuse shooting left breast pain.

EXAM:
DIGITAL DIAGNOSTIC BILATERAL MAMMOGRAM WITH TOMOSYNTHESIS AND CAD
TECHNIQUE: Bilateral digital diagnostic mammography and breast tomosynthesis
was performed. The images were evaluated with computer-aided
detection.

[L MLO synth-2D]
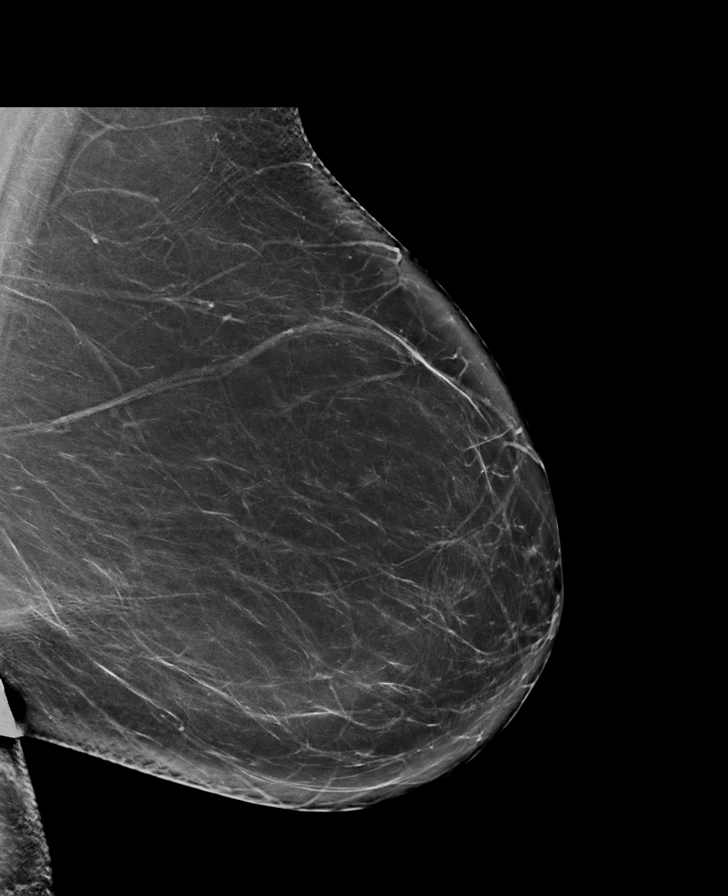

[R MLO synth-2D]
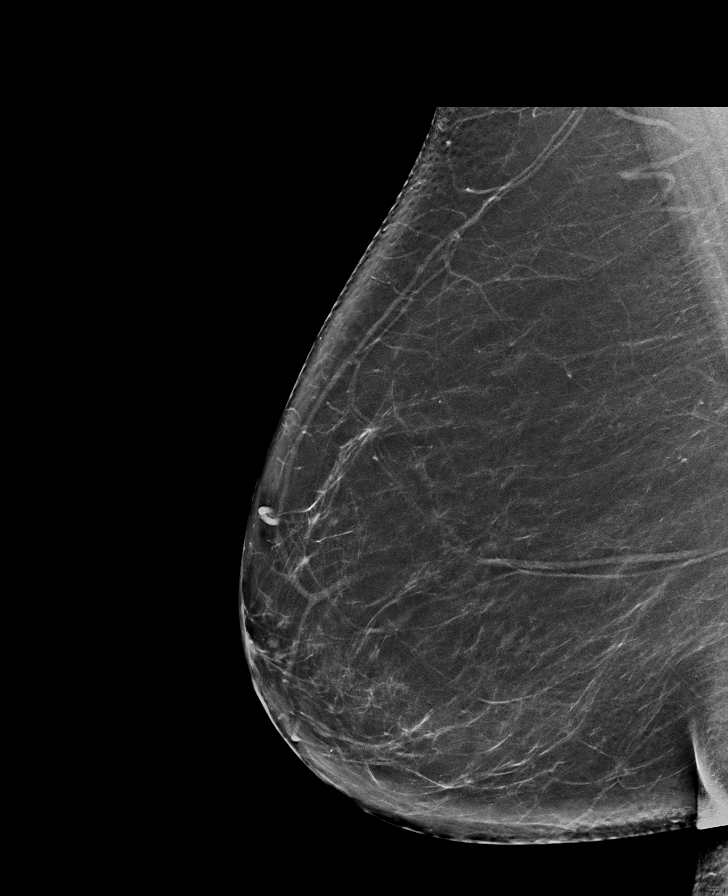

[L CC synth-2D]
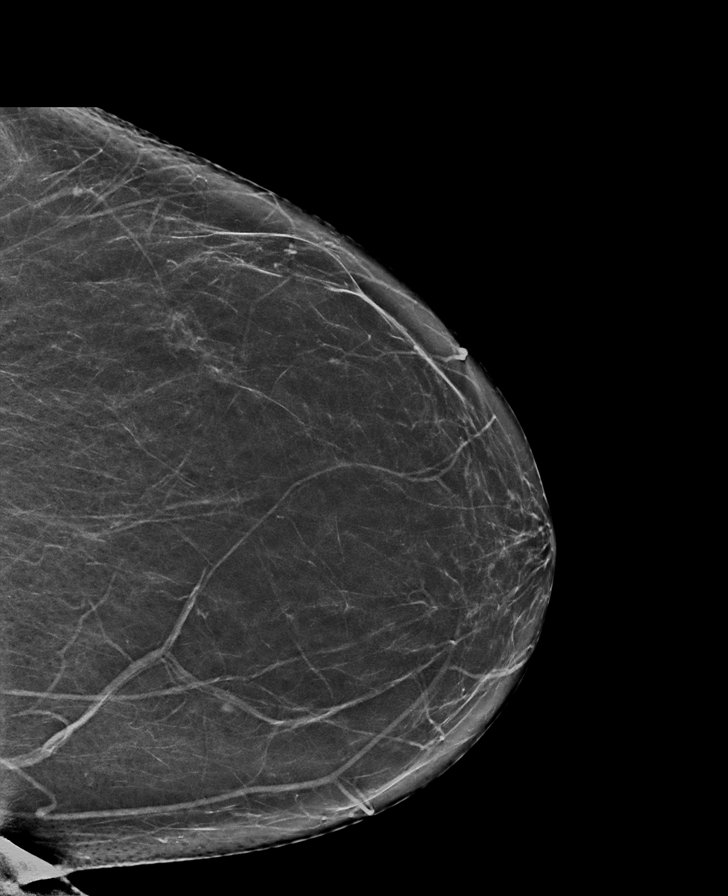

[R CC synth-2D]
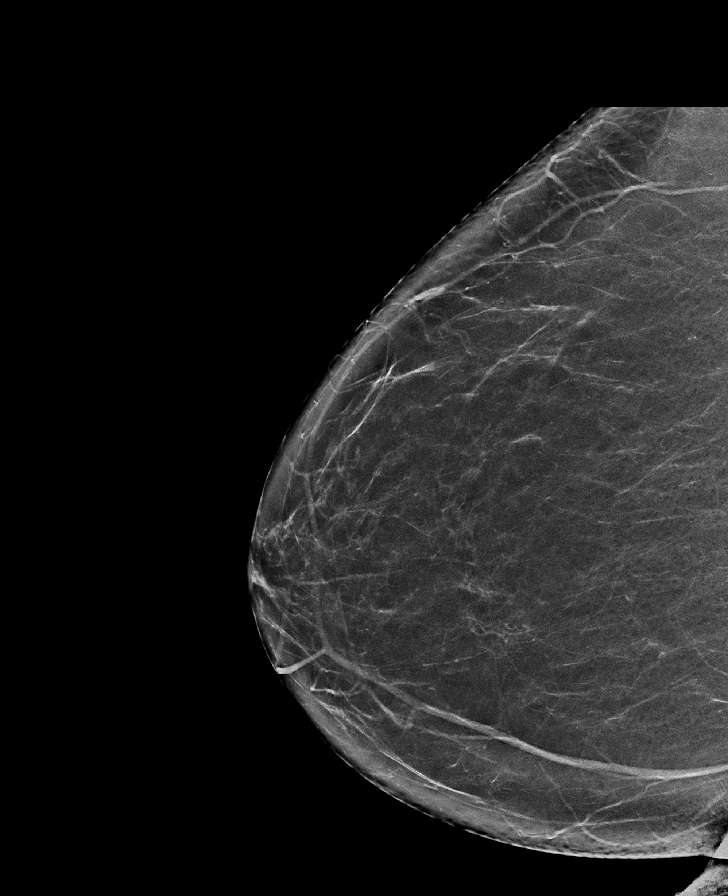

[L CC tomo · tomo slice 37/73.0]
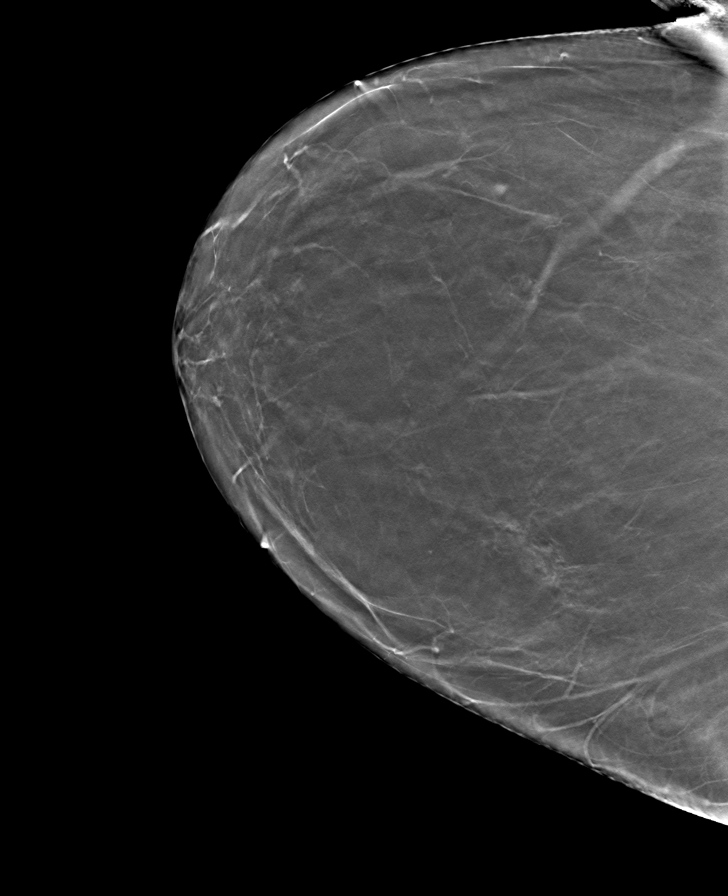

[R CC tomo · tomo slice 39/76.0]
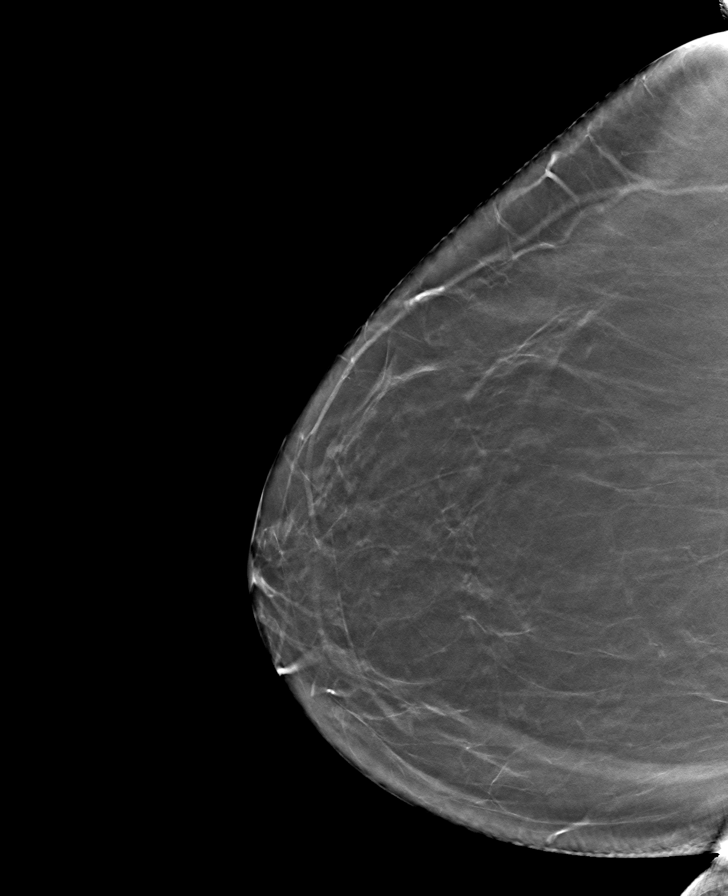

[R MLO tomo · tomo slice 42/83.0]
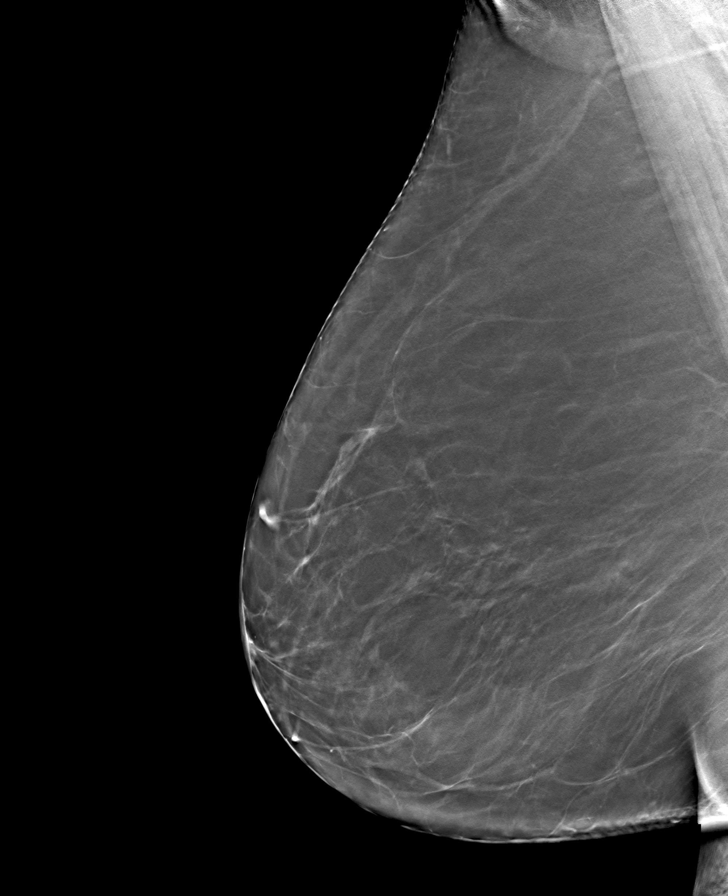

[L MLO tomo · tomo slice 44/87.0]
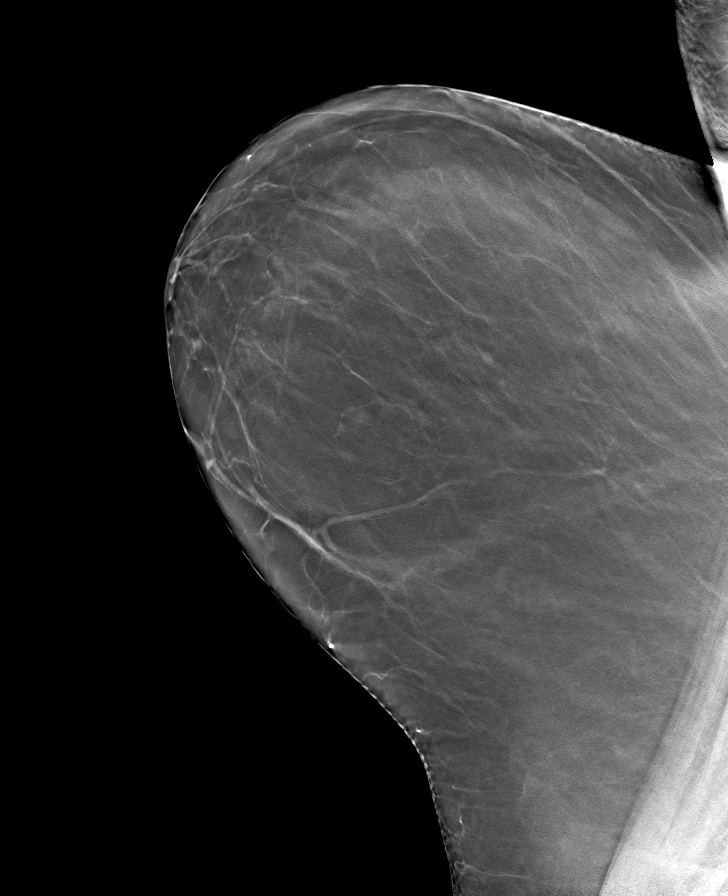

[8 of 24 positions shown; findings below may reference images not displayed]

ACR Breast Density Category b: There are scattered areas of
fibroglandular density.
FINDINGS: Right breast: No suspicious mass, distortion, or microcalcifications
are identified to suggest presence of malignancy.

Left breast: No suspicious mass, distortion, or microcalcifications
are identified to suggest presence of malignancy.
IMPRESSION: No mammographic evidence of malignancy bilaterally or other finding
to explain the patient's intermittent left breast pain.

RECOMMENDATION:
1. Clinical follow-up as needed for the left breast pain. Breast
pain is a common condition, which will often resolve on its own
without intervention. It can be affected by hormonal changes,
medication side effect, weight changes and fit of the bra. Pain may
also be referred from other adjacent areas of the body. Breast pain
may be improved by wearing adequate well-fitting support,
over-the-counter topical and oral NSAID medication, low-fat diet,
and ice/heat as needed. Studies have shown an improvement in cyclic
pain with use of evening primrose oil and vitamin E.

2.  Screening mammogram in one year.(Code:ZP-[id number])

I have discussed the findings and recommendations with the patient.
If applicable, a reminder letter will be sent to the patient
regarding the next appointment.

BI-RADS CATEGORY  1: Negative.

## 2021-11-29 ENCOUNTER — Other Ambulatory Visit: Payer: Self-pay | Admitting: Gastroenterology

## 2021-12-05 ENCOUNTER — Other Ambulatory Visit: Payer: Self-pay

## 2021-12-05 ENCOUNTER — Telehealth: Payer: Self-pay | Admitting: Gastroenterology

## 2021-12-05 MED ORDER — DIPHENOXYLATE-ATROPINE 2.5-0.025 MG PO TABS: ORAL_TABLET | ORAL | 5 refills | Status: DC

## 2021-12-05 NOTE — Telephone Encounter (Signed)
Patient left vm requesting to talk to Jacqueline Nichols. No other information was given.  ?

## 2021-12-14 ENCOUNTER — Encounter: Payer: Self-pay | Admitting: Internal Medicine

## 2021-12-14 ENCOUNTER — Ambulatory Visit (INDEPENDENT_AMBULATORY_CARE_PROVIDER_SITE_OTHER): Payer: 59 | Admitting: Internal Medicine

## 2021-12-14 DIAGNOSIS — E7800 Pure hypercholesterolemia, unspecified: Secondary | ICD-10-CM

## 2021-12-14 DIAGNOSIS — I700 Atherosclerosis of aorta: Secondary | ICD-10-CM

## 2021-12-14 DIAGNOSIS — Z85528 Personal history of other malignant neoplasm of kidney: Secondary | ICD-10-CM

## 2021-12-14 DIAGNOSIS — E1165 Type 2 diabetes mellitus with hyperglycemia: Secondary | ICD-10-CM | POA: Diagnosis not present

## 2021-12-14 DIAGNOSIS — K589 Irritable bowel syndrome without diarrhea: Secondary | ICD-10-CM

## 2021-12-14 DIAGNOSIS — G479 Sleep disorder, unspecified: Secondary | ICD-10-CM

## 2021-12-14 DIAGNOSIS — I10 Essential (primary) hypertension: Secondary | ICD-10-CM | POA: Diagnosis not present

## 2021-12-14 DIAGNOSIS — I471 Supraventricular tachycardia: Secondary | ICD-10-CM | POA: Diagnosis not present

## 2021-12-14 DIAGNOSIS — D509 Iron deficiency anemia, unspecified: Secondary | ICD-10-CM

## 2021-12-14 DIAGNOSIS — E538 Deficiency of other specified B group vitamins: Secondary | ICD-10-CM

## 2021-12-14 DIAGNOSIS — M546 Pain in thoracic spine: Secondary | ICD-10-CM

## 2021-12-14 DIAGNOSIS — F320 Major depressive disorder, single episode, mild: Secondary | ICD-10-CM

## 2021-12-14 DIAGNOSIS — R69 Illness, unspecified: Secondary | ICD-10-CM | POA: Diagnosis not present

## 2021-12-14 MED ORDER — GABAPENTIN 100 MG PO CAPS: ORAL_CAPSULE | ORAL | 2 refills | Status: DC

## 2021-12-14 NOTE — Progress Notes (Signed)
Patient ID: Jacqueline Nichols, female   DOB: [birth year], 66 y.o.   MRN: [id number] ? ? ?Subjective:  ? ? Patient ID: Jacqueline Nichols, female    DOB: 1955/09/28, 66 y.o.   MRN: [id number] ? ?This visit occurred during the SARS-CoV-2 public health emergency.  Safety protocols were in place, including screening questions prior to the visit, additional usage of staff PPE, and extensive cleaning of exam room while observing appropriate contact time as indicated for disinfecting solutions.  ? ?Patient here for a scheduled follow up.  ? ?Chief Complaint  ?Patient presents with  ? Follow-up  ?  33mof/u SVT  ? .  ? ?HPI ?Seeing Dr YTasia Catchingsfor f/u [facility]. Last evaluated 09/27/21.  Stable.  Recommended f/u in one year.  Has had chronic back/flank pain.  Dr YTasia Catchingsordered MRI.  Reviewed - Lumbar spine degeneration primarily affecting L3-4 and L4-5 where there is mild to moderate spinal stenosis. ?Noncompressive foraminal narrowing primarily at L3-4 and L4-5. Recommended MR bid.  Having issues with sleeping.  Has been taking 1/4 clonazepam q hs.  Discussed treatment options.  Discussed trial of gabapentin.  She has adjusted her diet.  Decreased sugar.  Low carb.  Has lost weight.  Walking on treadmill.  No chest pain or sob reported.  No abdominal pain or bowel change reported.   ? ? ?Past Medical History:  ?Diagnosis Date  ? Cancer ([facility]   ? skin  ? Chicken pox   ? Colon polyps   ? Depression   ? Diabetes mellitus ([place]   ? GERD (gastroesophageal reflux disease)   ? History of kidney cancer   ? Hyperlipidemia   ? Hypertension   ? Stroke ([facility]   ? brainstem  ? Urinary incontinence   ? ?Past Surgical History:  ?Procedure Laterality Date  ? CHOLECYSTECTOMY  2001  ? COLONOSCOPY WITH PROPOFOL N/A 03/27/2016  ? Procedure: COLONOSCOPY WITH PROPOFOL;  Surgeon: DLucilla Lame MD;  Location: [facility] ENDOSCOPY;  Service: Endoscopy;  Laterality: N/A;  ? COLONOSCOPY WITH PROPOFOL N/A 08/31/2021  ? Procedure: COLONOSCOPY WITH PROPOFOL;  Surgeon: WLucilla Lame MD;   [place]  Service: Endoscopy;  Laterality: N/A;  ? KNEE SURGERY  2003-2009  ? s/p 8 knee surgeries (with 3 knee replacements)  ? NEPHRECTOMY RADICAL  07/2015  ? NISSEN FUNDOPLICATION    ? ?Family History  ?Problem Relation Age of Onset  ? Colon cancer Mother   ?     died - age 64 ? Heart disease Father   ? Hypertension Father   ? Lymphoma Father   ? Arthritis/Rheumatoid Sister   ? Lung cancer Brother   ?     smoked  ? Arthritis/Rheumatoid Paternal Grandmother   ? Cancer Maternal Uncle   ?     Unknown type of cancer   ? Cancer Paternal Aunt   ?     Unknown type of cancer   ? Throat cancer Paternal Uncle   ? Colon cancer Paternal Uncle   ? Breast cancer Neg Hx   ? ?Social History  ? ?Socioeconomic History  ? Marital status: Divorced  ?  Spouse name: Not on file  ? Number of children: 2  ? Years of education: Not on file  ? Highest education level: Not on file  ?Occupational History  ? Not on file  ?Tobacco Use  ? Smoking status: Former  ?  Packs/day: 1.00  ?  Years: 15.00  ?  Pack years: 15.00  ?  Types: Cigarettes  ?  Quit date: 10/23/2012  ?  Years since quitting: 9.1  ? Smokeless tobacco: Never  ? Tobacco comments:  ?  Quit Feb 2014  ?Vaping Use  ? Vaping Use: Former  ? Start date: 02/20/2013  ? Quit date: 02/20/2014  ?Substance and Sexual Activity  ? Alcohol use: Yes  ?  Alcohol/week: 1.0 standard drink  ?  Types: 1 Glasses of wine per week  ?  Comment: social drinking - approx 2-4 monthly   ? Drug use: No  ? Sexual activity: Not Currently  ?Other Topics Concern  ? Not on file  ?Social History Narrative  ? Not on file  ? ?Social Determinants of Health  ? ?Financial Resource Strain: Not on file  ?Food Insecurity: Not on file  ?Transportation Needs: Not on file  ?Physical Activity: Not on file  ?Stress: Not on file  ?Social Connections: Not on file  ? ? ? ?Review of Systems  ?Constitutional:  Negative for appetite change and unexpected weight change.  ?HENT:  Negative for congestion and sinus  pressure.   ?Respiratory:  Negative for cough, chest tightness and shortness of breath.   ?Cardiovascular:  Negative for chest pain, palpitations and leg swelling.  ?Gastrointestinal:  Negative for abdominal pain, diarrhea, nausea and vomiting.  ?Genitourinary:  Negative for difficulty urinating and dysuria.  ?Musculoskeletal:  Positive for back pain. Negative for joint swelling and myalgias.  ?Skin:  Negative for color change and rash.  ?Neurological:  Negative for dizziness, light-headedness and headaches.  ?Psychiatric/Behavioral:  Negative for agitation and dysphoric mood.   ? ?   ?Objective:  ?  ? ?BP 128/84 (BP Location: Left Arm, Patient Position: Sitting, Cuff Size: Large)   Pulse 65   Temp 98.7 ?F (37.1 ?C) (Temporal)   Resp 14   Ht '5\' 10"'$  (1.778 m)   Wt 228 lb (103.4 kg)   SpO2 99%   BMI 32.71 kg/m?  ?Wt Readings from Last 3 Encounters:  ?12/14/21 228 lb (103.4 kg)  ?09/27/21 238 lb (108 kg)  ?08/31/21 235 lb (106.6 kg)  ? ? ?Physical Exam ?Vitals reviewed.  ?Constitutional:   ?   General: She is not in acute distress. ?   Appearance: Normal appearance.  ?HENT:  ?   Head: Normocephalic and atraumatic.  ?   Right Ear: External ear normal.  ?   Left Ear: External ear normal.  ?Eyes:  ?   General: No scleral icterus.    ?   Right eye: No discharge.     ?   Left eye: No discharge.  ?   Conjunctiva/sclera: Conjunctivae normal.  ?Neck:  ?   Thyroid: No thyromegaly.  ?Cardiovascular:  ?   Rate and Rhythm: Normal rate and regular rhythm.  ?Pulmonary:  ?   Effort: No respiratory distress.  ?   Breath sounds: Normal breath sounds. No wheezing.  ?Abdominal:  ?   General: Bowel sounds are normal.  ?   Palpations: Abdomen is soft.  ?   Tenderness: There is no abdominal tenderness.  ?Musculoskeletal:     ?   General: No swelling or tenderness.  ?   Cervical back: Neck supple. No tenderness.  ?Lymphadenopathy:  ?   Cervical: No cervical adenopathy.  ?Skin: ?   Findings: No erythema or rash.  ?Neurological:  ?    Mental Status: She is alert.  ?Psychiatric:     ?   Mood and Affect: Mood normal.     ?  Behavior: Behavior normal.  ? ? ? ?Outpatient Encounter Medications as of 12/14/2021  ?Medication Sig  ? bisoprolol-hydrochlorothiazide (ZIAC) 2.5-6.25 MG tablet TAKE 1 TABLET BY MOUTH DAILY FOR HIGH BLOOD PRESSURE  ? butalbital-acetaminophen-caffeine (FIORICET, ESGIC) 50-325-40 MG tablet TAKE ONE TABLET EACH DAY AS NEEDED FOR HEADACHE  ? citalopram (CELEXA) 40 MG tablet TAKE 1 TABLET BY MOUTH DAILY  ? clonazePAM (KLONOPIN) 0.5 MG tablet TAKE 1 TABLET BY MOUTH EVERY DAY AS NEEDED  ? clopidogrel (PLAVIX) 75 MG tablet TAKE 1 TABLET BY MOUTH EVERY DAY  ? diphenoxylate-atropine (LOMOTIL) 2.5-0.025 MG tablet TAKE 1 TABLET BY MOUTH 4 TIMES DAILY AS NEEDED FOR DIARRHEA OR LOOSE STOOLS  ? gabapentin (NEURONTIN) 100 MG capsule 1-2 capsules q hs  ? Loperamide HCl (IMODIUM PO) Take by mouth as needed.  ? meloxicam (MOBIC) 7.5 MG tablet TAKE ONE TABLET TWICE DAILY  ? Misc Natural Products (ALLERGY RELEAF SYSTEM PO) Take by mouth. Reported on 03/20/2016  ? nystatin cream (MYCOSTATIN) Apply 1 application topically 2 (two) times daily.  ? rosuvastatin (CRESTOR) 5 MG tablet TAKE 1 TABLET BY MOUTH DAILY  ? tiZANidine (ZANAFLEX) 4 MG tablet TAKE 1 TABLET BY MOUTH AT BEDTIME AS NEEDED FOR MUSCLE SPASMS  ? torsemide (DEMADEX) 20 MG tablet TAKE 1 TABLET BY MOUTH EVERY DAY AS NEEDED  ? ?No facility-administered encounter medications on file as of 12/14/2021.  ?  ? ?Lab Results  ?Component Value Date  ? WBC 7.0 09/25/2021  ? HGB 13.8 09/25/2021  ? HCT 42.0 09/25/2021  ? PLT 216 09/25/2021  ? GLUCOSE 96 11/01/2021  ? CHOL 168 11/01/2021  ? TRIG 152 (H) 11/01/2021  ? HDL 52 11/01/2021  ? [place] 90 11/01/2021  ? ALT 23 11/01/2021  ? AST 28 11/01/2021  ? NA 140 11/01/2021  ? K 4.8 11/01/2021  ? CL 102 11/01/2021  ? CREATININE 0.92 11/01/2021  ? BUN 13 11/01/2021  ? CO2 23 11/01/2021  ? TSH 1.090 11/01/2021  ? HGBA1C 5.5 11/01/2021  ? ? ?MR Lumbar Spine W  Wo Contrast ? ?Result Date: 10/09/2021 ?CLINICAL DATA:  2.5 years of central and right greater than left back pain. Pain with twisting. History of renal cell cancer EXAM: MRI LUMBAR SPINE WITHOUT AND WITH CONTRAST [facility]

## 2021-12-16 ENCOUNTER — Encounter: Payer: Self-pay | Admitting: Internal Medicine

## 2021-12-16 DIAGNOSIS — G479 Sleep disorder, unspecified: Secondary | ICD-10-CM | POA: Insufficient documentation

## 2021-12-16 NOTE — Assessment & Plan Note (Signed)
Problems sleeping as outlined.  Discussed.  Increased pain.  Trial of low dose gabapentin as directed.  Follow.  Call with update.  ?

## 2021-12-16 NOTE — Assessment & Plan Note (Signed)
Continue on torsemide and ziac.  Blood pressure as outlined - doing well.  Follow pressures.  Follow metabolic panel.  

## 2021-12-16 NOTE — Assessment & Plan Note (Signed)
S/p left radical nephrectomy and partial adrenalectomy.  Seeing Dr Tasia Catchings.  Recently evaluated as outlined.  Stable.  Recommended f/u in one year.  ?

## 2021-12-16 NOTE — Assessment & Plan Note (Signed)
Follow cbc.  

## 2021-12-16 NOTE — Assessment & Plan Note (Signed)
Stable on ziac.  

## 2021-12-16 NOTE — Assessment & Plan Note (Signed)
MRI reviewed.  Discussed.  Consider referral if continued pain.  ?

## 2021-12-16 NOTE — Assessment & Plan Note (Signed)
Continue crestor 

## 2021-12-16 NOTE — Assessment & Plan Note (Signed)
Continues on citalopram.  Stable.  ?

## 2021-12-16 NOTE — Assessment & Plan Note (Signed)
Colonoscopy 08/31/21 - internal hemorrhoids.  Recommended f/u colonoscopy in 7 years.   

## 2021-12-16 NOTE — Assessment & Plan Note (Signed)
She has adjusted her diet.  Decreased sugar and carbs. Continue low carb diet and exercise given blood sugar elevated.  Follow met b and a1c.  ?

## 2021-12-16 NOTE — Assessment & Plan Note (Addendum)
On B12 supplements.  B12 level just checked wnl.  ?

## 2021-12-17 ENCOUNTER — Telehealth: Payer: Self-pay

## 2021-12-17 ENCOUNTER — Telehealth: Payer: Self-pay | Admitting: Internal Medicine

## 2021-12-17 MED ORDER — BUTALBITAL-APAP-CAFFEINE 50-325-40 MG PO TABS: ORAL_TABLET | ORAL | 0 refills | Status: DC

## 2021-12-17 NOTE — Telephone Encounter (Signed)
Sent to Dr. Nicki Reaper for controlled substance approval ?

## 2021-12-17 NOTE — Telephone Encounter (Signed)
Pt called in requesting refill on medication (butalbital-acetaminophen-caffeine (FIORICET, ESGIC) 50-325-40 MG tablet)... Pt requesting callback  ?

## 2021-12-18 ENCOUNTER — Other Ambulatory Visit: Payer: Self-pay | Admitting: Internal Medicine

## 2021-12-20 ENCOUNTER — Other Ambulatory Visit: Payer: Self-pay

## 2021-12-20 MED ORDER — CITALOPRAM HYDROBROMIDE 40 MG PO TABS: 40.0000 mg | ORAL_TABLET | Freq: Every day | ORAL | 1 refills | Status: DC

## 2021-12-20 NOTE — Telephone Encounter (Signed)
Pharmacy called in requesting refill on medication (clonazePAM (KLONOPIN) 0.5 MG tablet)... Pharmacy stated that they received one fax back on pt medication but didn't received the fax back for (clonazePAM (KLONOPIN) 0.5 MG tablet)...  ?

## 2021-12-20 NOTE — Telephone Encounter (Signed)
Sent!

## 2021-12-20 NOTE — Telephone Encounter (Signed)
Pt is calling about medication refill and also she need a refill on clonazePAM sent to [facility]  ?

## 2021-12-21 NOTE — Telephone Encounter (Signed)
Rx ok'd for clonazepam #30 with no refills.   °

## 2021-12-25 NOTE — Telephone Encounter (Signed)
Pt called about update on medication 

## 2021-12-26 NOTE — Telephone Encounter (Signed)
Medication sent on 4/17 - lm for pt ?

## 2021-12-27 ENCOUNTER — Telehealth: Payer: Self-pay | Admitting: Internal Medicine

## 2021-12-27 NOTE — Telephone Encounter (Addendum)
Pt called about medication being filled.for headache. Pt has not had this medication for 2 years and request a new script created for it -FIORICET. Pt does not know if provider want pt to take something else for headache. Pt also need an increase in gabapentin sent to [facility]. Pt would like to be called when both medications are sent in to the pharmacy.  ?

## 2021-12-27 NOTE — Telephone Encounter (Signed)
error 

## 2021-12-27 NOTE — Telephone Encounter (Deleted)
Pt called stating she need an increase in gabapentin sent to [facility] ?

## 2021-12-28 ENCOUNTER — Other Ambulatory Visit: Payer: Self-pay

## 2021-12-28 MED ORDER — BUTALBITAL-APAP-CAFFEINE 50-325-40 MG PO TABS: ORAL_TABLET | ORAL | 0 refills | Status: DC

## 2021-12-28 NOTE — Telephone Encounter (Signed)
LMTCB

## 2021-12-28 NOTE — Telephone Encounter (Signed)
I have resent the Fiorcet to [facility]. She asked that gabapentin be refilled at higher dose discussed? Not sure what should be sent? ?

## 2021-12-28 NOTE — Telephone Encounter (Signed)
Per note, I had started her on gabapentin '100mg'$  - instructed to take one capsule q hs .  Could increase to two q hs.  (Rx written on 12/14/21 - with instruction to take 1-2 q hs prn.  Was written with refills.  What dose is she taking now?  Recommend not using fioricet on a regular basis.  Would prefer to see if the gabapentin - helps and would not need fioricet.   ?

## 2021-12-31 ENCOUNTER — Other Ambulatory Visit: Payer: Self-pay | Admitting: Internal Medicine

## 2021-12-31 NOTE — Telephone Encounter (Signed)
Pt stated that she feels that she has a high tolerance to medications & that the Gabapentin 200 mg is not doing much for her. She has been taking both capsules at night. She stated that she does not use Fioricet a lot but she has had an increase in headaches. I advised that hopes were gabapentin would help with headaches & the need for Fioricet would be less.  ?

## 2022-01-01 ENCOUNTER — Other Ambulatory Visit: Payer: Self-pay | Admitting: Internal Medicine

## 2022-01-01 NOTE — Telephone Encounter (Signed)
S/w pt - confirmed pt is taking gabapentin '100mg'$  2qhs. ?please send in rx for Gabapentin 300 1qhs. ?

## 2022-01-01 NOTE — Telephone Encounter (Signed)
If she is tolerating the gabapentin and feels is helping some, but needs a higher dose - can adjust dosing.  Confirm taking '100mg'$  - 2 qhs.  If correct, can send in rx for '300mg'$  gabapentin and have her take one q hs.   ?

## 2022-01-02 MED ORDER — GABAPENTIN 300 MG PO CAPS: 300.0000 mg | ORAL_CAPSULE | Freq: Every day | ORAL | 2 refills | Status: DC

## 2022-01-02 NOTE — Addendum Note (Signed)
Addended by: Alisa Graff on: 01/02/2022 05:20 AM ? ? Modules accepted: Orders ? ?

## 2022-01-02 NOTE — Telephone Encounter (Signed)
Rx sent in for gabapentin '300mg'$  q hs #30 with 2 refills.   ?

## 2022-01-03 NOTE — Telephone Encounter (Signed)
Rx sent in for tizanidine.  ?

## 2022-01-04 ENCOUNTER — Other Ambulatory Visit: Payer: Self-pay | Admitting: Internal Medicine

## 2022-01-15 ENCOUNTER — Other Ambulatory Visit: Payer: Self-pay | Admitting: Internal Medicine

## 2022-01-16 NOTE — Telephone Encounter (Signed)
Refilled: 12/21/2021 ?Last OV: 12/14/2021 ?Next OV: 03/18/2022 ?

## 2022-01-17 NOTE — Telephone Encounter (Signed)
Rx ok'd for clonazepam #30 with no refills.   °

## 2022-02-04 ENCOUNTER — Other Ambulatory Visit: Payer: Self-pay | Admitting: Internal Medicine

## 2022-02-18 ENCOUNTER — Other Ambulatory Visit: Payer: Self-pay | Admitting: Internal Medicine

## 2022-03-07 ENCOUNTER — Other Ambulatory Visit: Payer: Self-pay | Admitting: Internal Medicine

## 2022-03-18 ENCOUNTER — Encounter: Payer: 59 | Admitting: Internal Medicine

## 2022-03-20 ENCOUNTER — Other Ambulatory Visit: Payer: Self-pay | Admitting: Internal Medicine

## 2022-03-21 ENCOUNTER — Other Ambulatory Visit: Payer: Self-pay | Admitting: Internal Medicine

## 2022-03-21 NOTE — Telephone Encounter (Signed)
Rx ok;d for clonazepam #30 with one refill.  

## 2022-04-02 DIAGNOSIS — B029 Zoster without complications: Secondary | ICD-10-CM | POA: Diagnosis not present

## 2022-04-08 ENCOUNTER — Other Ambulatory Visit: Payer: Self-pay | Admitting: Internal Medicine

## 2022-04-12 ENCOUNTER — Telehealth: Payer: Self-pay

## 2022-04-12 ENCOUNTER — Encounter: Payer: 59 | Admitting: Internal Medicine

## 2022-04-22 ENCOUNTER — Other Ambulatory Visit: Payer: Self-pay | Admitting: Internal Medicine

## 2022-04-23 NOTE — Telephone Encounter (Signed)
Msg sent to dr scott 

## 2022-05-09 ENCOUNTER — Other Ambulatory Visit: Payer: Self-pay | Admitting: Internal Medicine

## 2022-05-22 ENCOUNTER — Other Ambulatory Visit: Payer: Self-pay | Admitting: Internal Medicine

## 2022-05-23 NOTE — Telephone Encounter (Signed)
Refilled 03/21/22  Last OV  12/14/21  Next OV  06/14/22

## 2022-05-24 NOTE — Telephone Encounter (Signed)
Rx ok;d for clonazepam #30 with one refill.  

## 2022-06-11 ENCOUNTER — Other Ambulatory Visit: Payer: Self-pay | Admitting: Internal Medicine

## 2022-06-14 ENCOUNTER — Encounter: Payer: 59 | Admitting: Internal Medicine

## 2022-06-20 ENCOUNTER — Other Ambulatory Visit: Payer: Self-pay | Admitting: Gastroenterology

## 2022-06-20 ENCOUNTER — Other Ambulatory Visit: Payer: Self-pay | Admitting: Internal Medicine

## 2022-06-22 ENCOUNTER — Other Ambulatory Visit: Payer: Self-pay | Admitting: Gastroenterology

## 2022-06-28 ENCOUNTER — Ambulatory Visit (INDEPENDENT_AMBULATORY_CARE_PROVIDER_SITE_OTHER): Payer: 59 | Admitting: Internal Medicine

## 2022-06-28 ENCOUNTER — Encounter: Payer: Self-pay | Admitting: Internal Medicine

## 2022-06-28 VITALS — BP 120/68 | HR 57 | Temp 98.0°F | Ht 70.0 in | Wt 215.1 lb

## 2022-06-28 DIAGNOSIS — Z0001 Encounter for general adult medical examination with abnormal findings: Secondary | ICD-10-CM | POA: Diagnosis not present

## 2022-06-28 DIAGNOSIS — E1165 Type 2 diabetes mellitus with hyperglycemia: Secondary | ICD-10-CM

## 2022-06-28 DIAGNOSIS — Z85528 Personal history of other malignant neoplasm of kidney: Secondary | ICD-10-CM

## 2022-06-28 DIAGNOSIS — Z23 Encounter for immunization: Secondary | ICD-10-CM | POA: Diagnosis not present

## 2022-06-28 DIAGNOSIS — F320 Major depressive disorder, single episode, mild: Secondary | ICD-10-CM

## 2022-06-28 DIAGNOSIS — D509 Iron deficiency anemia, unspecified: Secondary | ICD-10-CM | POA: Diagnosis not present

## 2022-06-28 DIAGNOSIS — I700 Atherosclerosis of aorta: Secondary | ICD-10-CM | POA: Diagnosis not present

## 2022-06-28 DIAGNOSIS — E538 Deficiency of other specified B group vitamins: Secondary | ICD-10-CM

## 2022-06-28 DIAGNOSIS — K589 Irritable bowel syndrome without diarrhea: Secondary | ICD-10-CM

## 2022-06-28 DIAGNOSIS — E559 Vitamin D deficiency, unspecified: Secondary | ICD-10-CM

## 2022-06-28 DIAGNOSIS — I10 Essential (primary) hypertension: Secondary | ICD-10-CM

## 2022-06-28 DIAGNOSIS — Z0000 Encounter for general adult medical examination without abnormal findings: Secondary | ICD-10-CM

## 2022-06-28 DIAGNOSIS — E7800 Pure hypercholesterolemia, unspecified: Secondary | ICD-10-CM | POA: Diagnosis not present

## 2022-06-28 DIAGNOSIS — I471 Supraventricular tachycardia, unspecified: Secondary | ICD-10-CM

## 2022-06-28 DIAGNOSIS — R110 Nausea: Secondary | ICD-10-CM

## 2022-06-28 DIAGNOSIS — R55 Syncope and collapse: Secondary | ICD-10-CM

## 2022-06-28 LAB — HM DIABETES FOOT EXAM

## 2022-06-28 NOTE — Progress Notes (Unsigned)
Patient ID: Jacqueline Nichols, female   DOB: 1955-12-17, 66 y.o.   MRN: [id number]   Subjective:    Patient ID: Jacqueline Nichols, female    DOB: [birth year], 66 y.o.   MRN: [id number]   Patient here for  Chief Complaint  Patient presents with   Annual Exam    Pt would also like to discuss medications and shingles vax   .   HPI Reports has been more emotional the last couple of months.  Increased stress.  Her partner's son and child moved in.  They have moved out now, but increased stress related.  Reports nausea.  Eating does make worse.  She feels is related to increased stress.  No chest pain.  Breathing stable.  Did pass out - x 2 this summer.  Was outside - hot, did not eat, not sure if hydrated.  No seizures.  No further episodes.  No dizziness or light headedness.  No abdominal pain.  Bowels moving.    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   Urinary incontinence    Past Surgical History:  Procedure Laterality Date   CHOLECYSTECTOMY  2001   COLONOSCOPY WITH PROPOFOL N/A 03/27/2016   Procedure: COLONOSCOPY WITH PROPOFOL;  Surgeon: Lucilla Lame, MD;  Location: [facility];  Service: Endoscopy;  Laterality: N/A;   COLONOSCOPY WITH PROPOFOL N/A 08/31/2021   Procedure: COLONOSCOPY WITH PROPOFOL;  Surgeon: Lucilla Lame, MD;  Location: [place];  Service: Endoscopy;  Laterality: N/A;   KNEE SURGERY  2003-2009   s/p 8 knee surgeries (with 3 knee replacements)   NEPHRECTOMY RADICAL  79/3903   NISSEN FUNDOPLICATION     Family History  Problem Relation Age of Onset   Colon cancer Mother        died - age 60   Heart disease Father    Hypertension Father    Lymphoma Father    Arthritis/Rheumatoid Sister    Lung cancer Brother        smoked    Arthritis/Rheumatoid Paternal Grandmother    Cancer Maternal Uncle        Unknown type of cancer    Cancer Paternal Aunt        Unknown type of cancer    Throat cancer Paternal Uncle    Colon cancer Paternal Uncle    Breast cancer Neg Hx    Social History   Socioeconomic History   Marital status: Divorced    Spouse name: Not on file   Number of children: 2   Years of education: Not on file   Highest education level: Not on file  Occupational History   Not on file  Tobacco Use   Smoking status: Former    Packs/day: 1.00    Years: 15.00    Total pack years: 15.00    Types: Cigarettes    Quit date: 10/23/2012    Years since quitting: 9.6   Smokeless tobacco: Never   Tobacco comments:    Quit Feb 2014  Vaping Use   Vaping Use: Former   Start date: 02/20/2013   Quit date: 02/20/2014  Substance and Sexual Activity   Alcohol use: Yes  Alcohol/week: 1.0 standard drink of alcohol    Types: 1 Glasses of wine per week    Comment: social drinking - approx 2-4 monthly    Drug use: No   Sexual activity: Not Currently  Other Topics Concern   Not on file  Social History Narrative   Not on file   Social Determinants of Health   Financial Resource Strain: Not on file  Food Insecurity: Not on file  Transportation Needs: Not on file  Physical Activity: Not on file  Stress: Not on file  Social Connections: Not on file     Review of Systems  Constitutional:  Negative for appetite change and unexpected weight change.  HENT:  Negative for congestion, sinus pressure and sore throat.   Eyes:  Negative for pain and visual disturbance.  Respiratory:  Negative for cough, chest tightness and shortness of breath.   Cardiovascular:  Negative for chest pain, palpitations and leg swelling.  Gastrointestinal:  Negative for abdominal pain, diarrhea, nausea and vomiting.  Genitourinary:  Negative for difficulty urinating and dysuria.  Musculoskeletal:  Negative for joint swelling and  myalgias.  Skin:  Negative for color change and rash.  Neurological:  Negative for dizziness and headaches.  Hematological:  Negative for adenopathy. Does not bruise/bleed easily.  Psychiatric/Behavioral:         Increased stress as outlined.  More emotional.         Objective:     BP 120/68 (BP Location: Left Arm, Patient Position: Sitting, Cuff Size: Normal)   Pulse (!) 57   Temp 98 F (36.7 C) (Oral)   Ht _0  (1.778 m)   Wt 215 lb 1.9 oz (97.6 kg)   SpO2 97%   BMI 30.87 kg/m  Wt Readings from Last 3 Encounters:  06/28/22 215 lb 1.9 oz (97.6 kg)  12/14/21 228 lb (103.4 kg)  09/27/21 238 lb (108 kg)    Physical Exam Vitals reviewed.  Constitutional:      General: She is not in acute distress.    Appearance: Normal appearance. She is well-developed.  HENT:     Head: Normocephalic and atraumatic.     Right Ear: External ear normal.     Left Ear: External ear normal.  Eyes:     General: No scleral icterus.       Right eye: No discharge.        Left eye: No discharge.     Conjunctiva/sclera: Conjunctivae normal.  Neck:     Thyroid: No thyromegaly.  Cardiovascular:     Rate and Rhythm: Normal rate and regular rhythm.  Pulmonary:     Effort: No tachypnea, accessory muscle usage or respiratory distress.     Breath sounds: Normal breath sounds. No decreased breath sounds or wheezing.  Chest:  Breasts:    Right: No inverted nipple, mass, nipple discharge or tenderness (no axillary adenopathy).     Left: No inverted nipple, mass, nipple discharge or tenderness (no axilarry adenopathy).  Abdominal:     General: Bowel sounds are normal.     Palpations: Abdomen is soft.     Tenderness: There is no abdominal tenderness.  Musculoskeletal:        General: No swelling or tenderness.     Cervical back: Neck supple.  Lymphadenopathy:     Cervical: No cervical adenopathy.  Skin:    Findings: No erythema or rash.  Neurological:     Mental Status: She is alert and  oriented to person, place, and time.  Psychiatric:        Mood and Affect: Mood normal.        Behavior: Behavior normal.      Outpatient Encounter Medications as of 06/28/2022  Medication Sig   bisoprolol-hydrochlorothiazide (ZIAC) 2.5-6.25 MG tablet TAKE 1 TABLET BY MOUTH DAILY FOR HIGH BLOOD PRESSURE   butalbital-acetaminophen-caffeine (FIORICET) 50-325-40 MG tablet TAKE ONE TABLET EACH DAY AS NEEDED FOR HEADACHE   citalopram (CELEXA) 40 MG tablet TAKE 1 TABLET BY MOUTH DAILY   clonazePAM (KLONOPIN) 0.5 MG tablet TAKE 1/2 TO 1 TABLET BY MOUTH EVERY DAY AS NEEDED   clopidogrel (PLAVIX) 75 MG tablet TAKE 1 TABLET BY MOUTH EVERY DAY   diphenoxylate-atropine (LOMOTIL) 2.5-0.025 MG tablet TAKE 1 TABLET BY MOUTH 4 TIMES DAILY AS NEEDED FOR DIARRHEA OR LOOSE STOOLS   gabapentin (NEURONTIN) 300 MG capsule TAKE 1 CAPSULE BY MOUTH AT BEDTIME   Loperamide HCl (IMODIUM PO) Take by mouth as needed.   meloxicam (MOBIC) 7.5 MG tablet TAKE 1 TABLET BY MOUTH TWICE A DAY   Misc Natural Products (ALLERGY RELEAF SYSTEM PO) Take by mouth. Reported on 03/20/2016   nystatin cream (MYCOSTATIN) Apply 1 application topically 2 (two) times daily.   rosuvastatin (CRESTOR) 5 MG tablet TAKE 1 TABLET BY MOUTH DAILY   tiZANidine (ZANAFLEX) 4 MG tablet TAKE 1 TABLET BY MOUTH AT BEDTIME AS NEEDED FOR MUSCLE SPASMS   torsemide (DEMADEX) 20 MG tablet TAKE 1 TABLET BY MOUTH EVERY DAY AS NEEDED   No facility-administered encounter medications on file as of 06/28/2022.     Lab Results  Component Value Date   WBC 5.3 06/28/2022   HGB 13.6 06/28/2022   HCT 40.4 06/28/2022   PLT 196 06/28/2022   GLUCOSE 82 06/28/2022   CHOL 124 06/28/2022   TRIG 124 06/28/2022   HDL 36 (L) 06/28/2022   LDLCALC 66 06/28/2022   ALT 13 06/28/2022   AST 15 06/28/2022   NA 137 06/28/2022   K 4.6 06/28/2022   CL 100 06/28/2022   CREATININE 0.89 06/28/2022   BUN 11 06/28/2022   CO2 22 06/28/2022   TSH 1.350 06/28/2022   HGBA1C 5.6  06/28/2022    MR Lumbar Spine W Wo Contrast  Result Date: 10/09/2021 CLINICAL DATA:  2.5 years of central and right greater than left back pain. Pain with twisting. History of renal cell cancer EXAM: MRI LUMBAR SPINE WITHOUT AND WITH CONTRAST TECHNIQUE: Multiplanar and multiecho pulse sequences of the lumbar spine were obtained without and with intravenous contrast. CONTRAST:  9m GADAVIST GADOBUTROL 1 MMOL/ML IV SOLN COMPARISON:  Abdominal CT 09/21/2020 FINDINGS: Segmentation: Standard lumbar numbering based on the available coverage Alignment:  Mild levocurvature. Vertebrae:  No fracture, evidence of discitis, or bone lesion. Conus medullaris and cauda equina: Conus extends to the L1-2 level. Conus and cauda equina appear normal. Paraspinal and other soft tissues: Negative for perispinal mass or inflammation. Left nephrectomy Disc levels: T12- L1: Unremarkable. L1-L2: Mild disc narrowing with small inferior foraminal protrusions. L2-L3: Small bilateral foraminal protrusion and mild facet spurring L3-L4: Moderate facet hypertrophy. Disc space narrowing and bulging. Moderate narrowing of the thecal sac. Noncompressive bilateral foraminal narrowing primarily from disc material L4-L5: Disc narrowing and endplate degeneration with circumferential bulging and endplate spurring. Mild facet spurring. Mild to moderate narrowing the thecal sac. Mild left foraminal narrowing. L5-S1:Mild facet spurring.  No impingement IMPRESSION: 1. 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 2. Noncompressive foraminal narrowing primarily  at L3-4 and L4-5. Electronically Signed   By: Jorje Guild M.D.   On: 10/09/2021 14:23       Assessment & Plan:   Problem List Items Addressed This Visit     Anemia    Follow cbc.       Aortic atherosclerosis (HCC)    Continue crestor.       B12 deficiency    On B12 supplements.  Follow B12 level.       Relevant  Orders   Vitamin B12 (Completed)   Depression, major, single episode, mild (HCC)    Increased stress as outlined.  More emotional.  On citalopram.  Feels needs something more to level things out. Start trintellix 50m with plans to increase to 1623mas tolerated.  Follow closely.  Schedule f/u soon to reassess.          Diabetes mellitus ([place]   Low carb diet and exercise.  Follow met b and a1c.      Relevant Orders   Basic metabolic panel (Completed)   Hemoglobin A1c (Completed)   Health care maintenance    Physical today 06/28/22.  Colonoscopy 2017.  Mammogram 07/13/21 - recommended screening in one year.       Hypercholesteremia    Continue crestor.       Relevant Orders   CBC with Differential/Platelet (Completed)   Hepatic function panel (Completed)   TSH (Completed)   Lipid panel (Completed)   Hypertension    Continue on torsemide and ziac.  Blood pressure as outlined - doing well.  Follow pressures.  Follow metabolic panel.       IBS (irritable bowel syndrome)    Colonoscopy 08/31/21 - internal hemorrhoids.  Recommended f/u colonoscopy in 7 years.        Nausea    Nausea - she feels is related to stress.  Wants to treat stress first and see if symptoms resolve.  Follow.        Personal history of renal cell carcinoma    S/p left radical nephrectomy and partial adrenalectomy.  Followed by Dr YuTasia Catchings       SVT (supraventricular tachycardia)    Stable on ziac.       Syncope    Reported two syncopal episodes as outlined.  Discussed.  EKG - SR with no acute ischemic changes.  Discussed further cardiac w/up.  Declines. Wants to monitor.  Discussed importance of staying hydrated and eating regular meals.  Follow.  Has only occurred when standing.        Relevant Orders   EKG 12-Lead (Completed)   Vitamin D deficiency    Continue vitamin D supplements.  Check vitamin D level.       Relevant Orders   VITAMIN D 25 Hydroxy (Vit-D Deficiency, Fractures) (Completed)    Other Visit Diagnoses     Routine general medical examination at a health care facility    -  Primary   Need for immunization against influenza       Relevant Orders   Flu Vaccine QUAD 23m64mo (Fluarix, Fluzone & Alfiuria Quad PF) (Completed)        ChaEinar PheasantD

## 2022-06-29 ENCOUNTER — Encounter: Payer: Self-pay | Admitting: Internal Medicine

## 2022-06-29 DIAGNOSIS — R110 Nausea: Secondary | ICD-10-CM | POA: Insufficient documentation

## 2022-06-29 LAB — CBC WITH DIFFERENTIAL/PLATELET
Basophils Absolute: 0 10*3/uL (ref 0.0–0.2)
Basos: 0 %
EOS (ABSOLUTE): 0.1 10*3/uL (ref 0.0–0.4)
Eos: 2 %
Hematocrit: 40.4 % (ref 34.0–46.6)
Hemoglobin: 13.6 g/dL (ref 11.1–15.9)
Immature Grans (Abs): 0 10*3/uL (ref 0.0–0.1)
Immature Granulocytes: 0 %
Lymphocytes Absolute: 1.6 10*3/uL (ref 0.7–3.1)
Lymphs: 30 %
MCH: 30.8 pg (ref 26.6–33.0)
MCHC: 33.7 g/dL (ref 31.5–35.7)
MCV: 91 fL (ref 79–97)
Monocytes Absolute: 0.3 10*3/uL (ref 0.1–0.9)
Monocytes: 5 %
Neutrophils Absolute: 3.3 10*3/uL (ref 1.4–7.0)
Neutrophils: 63 %
Platelets: 196 10*3/uL (ref 150–450)
RBC: 4.42 x10E6/uL (ref 3.77–5.28)
RDW: 12.3 % (ref 11.7–15.4)
WBC: 5.3 10*3/uL (ref 3.4–10.8)

## 2022-06-29 LAB — HEPATIC FUNCTION PANEL
ALT: 13 IU/L (ref 0–32)
AST: 15 IU/L (ref 0–40)
Albumin: 4.3 g/dL (ref 3.9–4.9)
Alkaline Phosphatase: 97 IU/L (ref 44–121)
Bilirubin Total: 0.4 mg/dL (ref 0.0–1.2)
Bilirubin, Direct: 0.16 mg/dL (ref 0.00–0.40)
Total Protein: 6.2 g/dL (ref 6.0–8.5)

## 2022-06-29 LAB — LIPID PANEL
Chol/HDL Ratio: 3.4 ratio (ref 0.0–4.4)
Cholesterol, Total: 124 mg/dL (ref 100–199)
HDL: 36 mg/dL — ABNORMAL LOW (ref >39)
LDL Chol Calc (NIH): 66 mg/dL (ref 0–99)
Triglycerides: 124 mg/dL (ref 0–149)
VLDL Cholesterol Cal: 22 mg/dL (ref 5–40)

## 2022-06-29 LAB — BASIC METABOLIC PANEL
BUN/Creatinine Ratio: 12 (ref 12–28)
BUN: 11 mg/dL (ref 8–27)
CO2: 22 mmol/L (ref 20–29)
Calcium: 9.4 mg/dL (ref 8.7–10.3)
Chloride: 100 mmol/L (ref 96–106)
Creatinine, Ser: 0.89 mg/dL (ref 0.57–1.00)
Glucose: 82 mg/dL (ref 70–99)
Potassium: 4.6 mmol/L (ref 3.5–5.2)
Sodium: 137 mmol/L (ref 134–144)
eGFR: 72 mL/min/{1.73_m2} (ref >59)

## 2022-06-29 LAB — HEMOGLOBIN A1C
Est. average glucose Bld gHb Est-mCnc: 114 mg/dL
Hgb A1c MFr Bld: 5.6 % (ref 4.8–5.6)

## 2022-06-29 LAB — VITAMIN B12: Vitamin B-12: 475 pg/mL (ref 232–1245)

## 2022-06-29 LAB — VITAMIN D 25 HYDROXY (VIT D DEFICIENCY, FRACTURES): Vit D, 25-Hydroxy: 23.7 ng/mL — ABNORMAL LOW (ref 30.0–100.0)

## 2022-06-29 LAB — TSH: TSH: 1.35 u[IU]/mL (ref 0.450–4.500)

## 2022-06-29 NOTE — Assessment & Plan Note (Signed)
S/p left radical nephrectomy and partial adrenalectomy.  Followed by Dr Tasia Catchings.

## 2022-06-29 NOTE — Assessment & Plan Note (Signed)
Continue on torsemide and ziac.  Blood pressure as outlined - doing well.  Follow pressures.  Follow metabolic panel.

## 2022-06-29 NOTE — Assessment & Plan Note (Signed)
Colonoscopy 08/31/21 - internal hemorrhoids.  Recommended f/u colonoscopy in 7 years.

## 2022-06-29 NOTE — Assessment & Plan Note (Signed)
Physical today 06/28/22.  Colonoscopy 2017.  Mammogram 07/13/21 - recommended screening in one year.

## 2022-06-29 NOTE — Assessment & Plan Note (Addendum)
Reported two syncopal episodes as outlined.  Discussed.  EKG - SR with no acute ischemic changes.  Discussed further cardiac w/up.  Declines. Wants to monitor.  Discussed importance of staying hydrated and eating regular meals.  Follow.  Has only occurred when standing.

## 2022-06-29 NOTE — Assessment & Plan Note (Signed)
Continue crestor 

## 2022-06-29 NOTE — Assessment & Plan Note (Signed)
On B12 supplements.  Follow B12 level.

## 2022-06-29 NOTE — Assessment & Plan Note (Signed)
Low carb diet and exercise.  Follow met b and a1c.

## 2022-06-29 NOTE — Assessment & Plan Note (Signed)
Follow cbc.  

## 2022-06-29 NOTE — Assessment & Plan Note (Signed)
Continue vitamin D supplements.  Check vitamin D level.

## 2022-06-29 NOTE — Assessment & Plan Note (Signed)
Increased stress as outlined.  More emotional.  On citalopram.  Feels needs something more to level things out. Start trintellix '5mg'$  with plans to increase to '10mg'$  as tolerated.  Follow closely.  Schedule f/u soon to reassess.

## 2022-06-29 NOTE — Assessment & Plan Note (Signed)
Nausea - she feels is related to stress.  Wants to treat stress first and see if symptoms resolve.  Follow.

## 2022-06-29 NOTE — Assessment & Plan Note (Signed)
Stable on ziac.

## 2022-06-30 ENCOUNTER — Encounter: Payer: Self-pay | Admitting: *Deleted

## 2022-07-02 ENCOUNTER — Other Ambulatory Visit: Payer: Self-pay

## 2022-07-02 ENCOUNTER — Telehealth: Payer: Self-pay | Admitting: Internal Medicine

## 2022-07-02 MED ORDER — VORTIOXETINE HBR 5 MG PO TABS: 5.0000 mg | ORAL_TABLET | Freq: Every day | ORAL | 0 refills | Status: DC

## 2022-07-02 NOTE — Telephone Encounter (Signed)
Yes. I did speak to her.  Recommended trintillex '5mg'$  q day.  Keep me posted.

## 2022-07-02 NOTE — Telephone Encounter (Signed)
Pt called stating Jacqueline Nichols was supposed to consult with another provider about medication. Pt would like to know if that has happen

## 2022-07-02 NOTE — Telephone Encounter (Signed)
PT ADVISED.

## 2022-07-03 ENCOUNTER — Other Ambulatory Visit: Payer: Self-pay

## 2022-07-03 DIAGNOSIS — F320 Major depressive disorder, single episode, mild: Secondary | ICD-10-CM

## 2022-07-03 MED ORDER — VORTIOXETINE HBR 5 MG PO TABS
5.0000 mg | ORAL_TABLET | Freq: Every day | ORAL | 0 refills | Status: DC
  Filled 2022-07-03: qty 30, 30d supply, fill #0

## 2022-07-03 NOTE — Telephone Encounter (Signed)
Pt advised trying to get assistance. Not ACO - not eligible for pt assistance/care coordination  RX sent to [facility]  Msg sent to kristie to see if theres anything they can do to help pt afford med

## 2022-07-03 NOTE — Telephone Encounter (Signed)
Patient called and stated she can not afford vortioxetine HBr (TRINTELLIX) 5 MG TABS tablet.

## 2022-07-03 NOTE — Telephone Encounter (Signed)
Yes if pt agreeable.  Let her know that we are trying to get assistance.  Thanks

## 2022-07-04 ENCOUNTER — Other Ambulatory Visit: Payer: Self-pay

## 2022-07-04 NOTE — Telephone Encounter (Signed)
Thank you :)

## 2022-07-04 NOTE — Telephone Encounter (Signed)
Per Steffanie Dunn - was able to work out discount for pt - copay 10$ Pt advised per Steffanie Dunn - picking up rx today

## 2022-07-08 ENCOUNTER — Other Ambulatory Visit: Payer: Self-pay | Admitting: Internal Medicine

## 2022-07-08 DIAGNOSIS — Z1231 Encounter for screening mammogram for malignant neoplasm of breast: Secondary | ICD-10-CM

## 2022-07-09 ENCOUNTER — Other Ambulatory Visit: Payer: Self-pay | Admitting: Internal Medicine

## 2022-07-17 DIAGNOSIS — M25561 Pain in right knee: Secondary | ICD-10-CM | POA: Diagnosis not present

## 2022-07-22 ENCOUNTER — Other Ambulatory Visit: Payer: Self-pay | Admitting: Internal Medicine

## 2022-07-22 ENCOUNTER — Telehealth: Payer: Self-pay

## 2022-07-22 ENCOUNTER — Telehealth: Payer: Self-pay | Admitting: Internal Medicine

## 2022-07-22 NOTE — Telephone Encounter (Signed)
Msg sent to dr Nicki Reaper

## 2022-07-22 NOTE — Telephone Encounter (Signed)
Pt would like to be called in regards to a new medication she is on.

## 2022-07-23 MED ORDER — BUTALBITAL-APAP-CAFFEINE 50-325-40 MG PO TABS: ORAL_TABLET | ORAL | 0 refills | Status: DC

## 2022-07-23 NOTE — Telephone Encounter (Signed)
Rx sent in for fioricet.  Regarding the bleeding, can schedule appt for evaluation if noticing increased bleeding.  Prednisone can affect - increased bruising, but if increased bleeding - can schedule appt for further evaluation.

## 2022-07-23 NOTE — Telephone Encounter (Signed)
Rx ok'd for clonazepam #30 with no refills.   °

## 2022-07-24 NOTE — Telephone Encounter (Signed)
Rx called in to [place] at [facility] Pt aware

## 2022-07-24 NOTE — Telephone Encounter (Signed)
Patient called and said she never received her butalbital-acetaminophen-caffeine (FIORICET) 50-325-40 MG tablet.

## 2022-08-05 ENCOUNTER — Other Ambulatory Visit: Payer: Self-pay | Admitting: Internal Medicine

## 2022-08-05 ENCOUNTER — Other Ambulatory Visit: Payer: Self-pay

## 2022-08-07 ENCOUNTER — Other Ambulatory Visit: Payer: Self-pay

## 2022-08-07 ENCOUNTER — Telehealth: Payer: Self-pay | Admitting: Internal Medicine

## 2022-08-07 MED FILL — Vortioxetine HBr Tab 5 MG (Base Equiv): ORAL | 30 days supply | Qty: 30 | Fill #0 | Status: AC

## 2022-08-07 NOTE — Telephone Encounter (Signed)
Rx sent 

## 2022-08-07 NOTE — Telephone Encounter (Signed)
Patient called and is requesting a refill on her vortioxetine HBr (TRINTELLIX) 5 MG TABS tablet. Patient has been  out of medication for 3 days.

## 2022-08-08 ENCOUNTER — Other Ambulatory Visit: Payer: Self-pay | Admitting: Internal Medicine

## 2022-08-08 ENCOUNTER — Other Ambulatory Visit: Payer: Self-pay

## 2022-08-09 ENCOUNTER — Encounter: Payer: Self-pay | Admitting: Internal Medicine

## 2022-08-09 ENCOUNTER — Other Ambulatory Visit: Payer: Self-pay

## 2022-08-09 ENCOUNTER — Ambulatory Visit (INDEPENDENT_AMBULATORY_CARE_PROVIDER_SITE_OTHER): Payer: 59 | Admitting: Internal Medicine

## 2022-08-09 ENCOUNTER — Telehealth: Payer: Self-pay | Admitting: Internal Medicine

## 2022-08-09 VITALS — BP 128/72 | HR 53 | Temp 98.7°F | Resp 17 | Ht 70.0 in | Wt 219.2 lb

## 2022-08-09 DIAGNOSIS — M2550 Pain in unspecified joint: Secondary | ICD-10-CM

## 2022-08-09 DIAGNOSIS — I700 Atherosclerosis of aorta: Secondary | ICD-10-CM

## 2022-08-09 DIAGNOSIS — E7800 Pure hypercholesterolemia, unspecified: Secondary | ICD-10-CM

## 2022-08-09 DIAGNOSIS — D509 Iron deficiency anemia, unspecified: Secondary | ICD-10-CM | POA: Diagnosis not present

## 2022-08-09 DIAGNOSIS — R69 Illness, unspecified: Secondary | ICD-10-CM | POA: Diagnosis not present

## 2022-08-09 DIAGNOSIS — E1165 Type 2 diabetes mellitus with hyperglycemia: Secondary | ICD-10-CM

## 2022-08-09 DIAGNOSIS — F320 Major depressive disorder, single episode, mild: Secondary | ICD-10-CM

## 2022-08-09 DIAGNOSIS — I471 Supraventricular tachycardia, unspecified: Secondary | ICD-10-CM

## 2022-08-09 DIAGNOSIS — I10 Essential (primary) hypertension: Secondary | ICD-10-CM

## 2022-08-09 DIAGNOSIS — Z85528 Personal history of other malignant neoplasm of kidney: Secondary | ICD-10-CM

## 2022-08-09 MED ORDER — VORTIOXETINE HBR 10 MG PO TABS
10.0000 mg | ORAL_TABLET | Freq: Every day | ORAL | 2 refills | Status: DC
  Filled 2022-08-30: qty 30, 30d supply, fill #0
  Filled 2022-10-04: qty 30, 30d supply, fill #1
  Filled 2022-10-29: qty 30, 30d supply, fill #2

## 2022-08-09 NOTE — Progress Notes (Unsigned)
Patient ID: NYELLI SAMARA, female   DOB: 1956-04-28, 66 y.o.   MRN: [id number]   Subjective:    Patient ID: CELESTER MORGAN, female    DOB: September 05, 1955, 66 y.o.   MRN: [id number]  This visit occurred during the SARS-CoV-2 public health emergency.  Safety protocols were in place, including screening questions prior to the visit, additional usage of staff PPE, and extensive cleaning of exam room while observing appropriate contact time as indicated for disinfecting solutions.   Patient here for  Chief Complaint  Patient presents with   Hypertension   .   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;  Service: Endoscopy;  Laterality: N/A;   KNEE SURGERY  2003-2009   s/p 8 knee surgeries (with 3 knee replacements)   NEPHRECTOMY RADICAL  56/8127   NISSEN FUNDOPLICATION     Family History  Problem Relation Age of Onset   Colon cancer Mother        died - age 48   Heart disease Father    Hypertension Father    Lymphoma Father    Arthritis/Rheumatoid Sister    Lung cancer Brother        smoked   Arthritis/Rheumatoid Paternal Grandmother    Cancer Maternal Uncle        Unknown type of cancer    Cancer Paternal Aunt        Unknown type of cancer    Throat cancer Paternal Uncle    Colon cancer Paternal Uncle    Breast cancer Neg Hx     Social History   Socioeconomic History   Marital status: Divorced    Spouse name: Not on file   Number of children: 2   Years of education: Not on file   Highest education level: Not on file  Occupational History   Not on file  Tobacco Use   Smoking status: Former    Packs/day: 1.00    Years: 15.00    Total pack years: 15.00    Types: Cigarettes    Quit date: 10/23/2012    Years since quitting: 9.8   Smokeless tobacco: Never   Tobacco comments:    Quit Feb 2014  Vaping Use   Vaping Use: Former   Start date: 02/20/2013   Quit date: 02/20/2014  Substance and Sexual Activity   Alcohol use: Yes    Alcohol/week: 1.0 standard drink of alcohol    Types: 1 Glasses of wine per week    Comment: social drinking - approx 2-4 monthly    Drug use: No   Sexual activity: Not Currently  Other Topics Concern   Not on file  Social History Narrative   Not on file   Social Determinants of Health  Financial Resource Strain: Not on file  Food Insecurity: Not on file  Transportation Needs: Not on file  Physical Activity: Not on file  Stress: Not on file  Social Connections: Not on file     Review of Systems     Objective:     BP 136/86   Pulse (!) 53   Temp 98.7 F (37.1 C) (Temporal)   Resp 17   Ht '5\' 10"'$  (1.778 m)   Wt 219 lb 3.2 oz (99.4 kg)   SpO2 97%   BMI 31.45 kg/m  Wt Readings from Last 3 Encounters:  08/09/22 219 lb 3.2 oz (99.4 kg)  06/28/22 215 lb 1.9 oz (97.6 kg)  12/14/21 228 lb (103.4 kg)    Physical Exam   Outpatient Encounter Medications as of 08/09/2022  Medication Sig   bisoprolol-hydrochlorothiazide (ZIAC) 2.5-6.25 MG tablet TAKE 1 TABLET BY MOUTH DAILY FOR HIGH BLOOD PRESSURE   butalbital-acetaminophen-caffeine (FIORICET) 50-325-40 MG tablet TAKE ONE TABLET EACH DAY AS NEEDED FOR HEADACHE   citalopram (CELEXA) 40 MG tablet TAKE 1 TABLET BY MOUTH DAILY   clonazePAM (KLONOPIN) 0.5 MG tablet TAKE 1/2 TO 1 TABLET BY MOUTH EVERY DAY AS NEEDED    clopidogrel (PLAVIX) 75 MG tablet TAKE 1 TABLET BY MOUTH EVERY DAY   diphenoxylate-atropine (LOMOTIL) 2.5-0.025 MG tablet TAKE 1 TABLET BY MOUTH 4 TIMES DAILY AS NEEDED FOR DIARRHEA OR LOOSE STOOLS   gabapentin (NEURONTIN) 300 MG capsule TAKE 1 CAPSULE BY MOUTH AT BEDTIME   Loperamide HCl (IMODIUM PO) Take by mouth as needed.   meloxicam (MOBIC) 7.5 MG tablet TAKE 1 TABLET BY MOUTH TWICE A DAY   Misc Natural Products (ALLERGY RELEAF SYSTEM PO) Take by mouth. Reported on 03/20/2016   nystatin cream (MYCOSTATIN) Apply 1 application topically 2 (two) times daily.   rosuvastatin (CRESTOR) 5 MG tablet TAKE 1 TABLET BY MOUTH DAILY   tiZANidine (ZANAFLEX) 4 MG tablet TAKE 1 TABLET BY MOUTH AT BEDTIME AS NEEDED FOR MUSCLE SPASMS   torsemide (DEMADEX) 20 MG tablet TAKE 1 TABLET BY MOUTH EVERY DAY AS NEEDED   vortioxetine HBr (TRINTELLIX) 5 MG TABS tablet Take 1 tablet (5 mg total) by mouth daily.   No facility-administered encounter medications on file as of 08/09/2022.     Lab Results  Component Value Date   WBC 5.3 06/28/2022   HGB 13.6 06/28/2022   HCT 40.4 06/28/2022   PLT 196 06/28/2022   GLUCOSE 82 06/28/2022   CHOL 124 06/28/2022   TRIG 124 06/28/2022   HDL 36 (L) 06/28/2022   LDLCALC 66 06/28/2022   ALT 13 06/28/2022   AST 15 06/28/2022   NA 137 06/28/2022   K 4.6 06/28/2022   CL 100 06/28/2022   CREATININE 0.89 06/28/2022   BUN 11 06/28/2022   CO2 22 06/28/2022   TSH 1.350 06/28/2022   HGBA1C 5.6 06/28/2022    MR Lumbar Spine W Wo Contrast  Result Date: 10/09/2021 CLINICAL DATA:  2.5 years of central and right greater than left back pain. Pain with twisting. History of renal cell cancer EXAM: MRI LUMBAR SPINE WITHOUT AND WITH CONTRAST TECHNIQUE: Multiplanar and multiecho pulse sequences of the lumbar spine were obtained without and with intravenous contrast. CONTRAST:  2m GADAVIST GADOBUTROL 1 MMOL/ML IV SOLN COMPARISON:  Abdominal CT 09/21/2020 FINDINGS: Segmentation: Standard  lumbar numbering based on the available coverage Alignment:  Mild levocurvature. Vertebrae:  No fracture, evidence of discitis, or bone lesion. Conus medullaris and cauda equina: Conus extends to the  L1-2 level. Conus and cauda equina appear normal. Paraspinal and other soft tissues: Negative for perispinal mass or inflammation. Left nephrectomy Disc levels: T12- L1: Unremarkable. L1-L2: Mild disc narrowing with small inferior foraminal protrusions. L2-L3: Small bilateral foraminal protrusion and mild facet spurring L3-L4: Moderate facet hypertrophy. Disc space narrowing and bulging. Moderate narrowing of the thecal sac. Noncompressive bilateral foraminal narrowing primarily from disc material L4-L5: Disc narrowing and endplate degeneration with circumferential bulging and endplate spurring. Mild facet spurring. Mild to moderate narrowing the thecal sac. Mild left foraminal narrowing. L5-S1:Mild facet spurring.  No impingement IMPRESSION: 1. 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 2. Noncompressive foraminal narrowing primarily at L3-4 and L4-5. Electronically Signed   By: Jorje Guild M.D.   On: 10/09/2021 14:23       Assessment & Plan:   Problem List Items Addressed This Visit   None    Einar Pheasant, MD

## 2022-08-09 NOTE — Telephone Encounter (Signed)
As checkout pt request to do lab work at [facility]. Please make sure orders are in per request.

## 2022-08-09 NOTE — Telephone Encounter (Signed)
Orders placed.

## 2022-08-11 ENCOUNTER — Encounter: Payer: Self-pay | Admitting: Internal Medicine

## 2022-08-11 DIAGNOSIS — M2550 Pain in unspecified joint: Secondary | ICD-10-CM | POA: Insufficient documentation

## 2022-08-11 NOTE — Assessment & Plan Note (Signed)
Low carb diet and exercise.  Follow met b and a1c.

## 2022-08-11 NOTE — Assessment & Plan Note (Signed)
Stable on ziac.

## 2022-08-11 NOTE — Assessment & Plan Note (Signed)
Follow cbc.  

## 2022-08-11 NOTE — Assessment & Plan Note (Signed)
S/p left radical nephrectomy and partial adrenalectomy.  Seeing Dr Yu.  Stable.  Recommended f/u in one year.  

## 2022-08-11 NOTE — Assessment & Plan Note (Signed)
Continue crestor 

## 2022-08-11 NOTE — Assessment & Plan Note (Signed)
Varied joint pain as outlined.  On mobic.  Discussed trying to limit dosing.  Follow. Discussed further lab testing - RF, etc.  Follow.

## 2022-08-11 NOTE — Assessment & Plan Note (Signed)
Increased anxiety as outlined.  More emotional.  On citalopram. Started on trintilex last visit.  Tolerating.  Will increase to '10mg'$  q day.  Follow closely.  Discussed referral to psychiatry to help with medication management.  Will let me know if desires referral.  Schedule f/u soon to reassess.

## 2022-08-11 NOTE — Assessment & Plan Note (Signed)
Continue on torsemide and ziac.  Blood pressure as outlined.  Recheck improved. Follow pressures.  Follow metabolic panel.

## 2022-08-16 ENCOUNTER — Ambulatory Visit
Admission: RE | Admit: 2022-08-16 | Discharge: 2022-08-16 | Disposition: A | Discharge status: Home / Self Care | Payer: 59 | Source: Ambulatory Visit | Attending: Internal Medicine | Admitting: Internal Medicine

## 2022-08-16 DIAGNOSIS — Z1231 Encounter for screening mammogram for malignant neoplasm of breast: Secondary | ICD-10-CM

## 2022-08-21 ENCOUNTER — Other Ambulatory Visit: Payer: Self-pay | Admitting: Internal Medicine

## 2022-08-22 ENCOUNTER — Other Ambulatory Visit: Payer: Self-pay | Admitting: Internal Medicine

## 2022-08-22 ENCOUNTER — Other Ambulatory Visit: Payer: Self-pay

## 2022-08-26 DIAGNOSIS — K219 Gastro-esophageal reflux disease without esophagitis: Secondary | ICD-10-CM | POA: Diagnosis not present

## 2022-08-26 DIAGNOSIS — R0602 Shortness of breath: Secondary | ICD-10-CM | POA: Diagnosis not present

## 2022-08-26 DIAGNOSIS — Z8249 Family history of ischemic heart disease and other diseases of the circulatory system: Secondary | ICD-10-CM | POA: Diagnosis not present

## 2022-08-26 DIAGNOSIS — I4891 Unspecified atrial fibrillation: Secondary | ICD-10-CM | POA: Diagnosis not present

## 2022-08-26 DIAGNOSIS — Z20822 Contact with and (suspected) exposure to covid-19: Secondary | ICD-10-CM | POA: Diagnosis not present

## 2022-08-26 DIAGNOSIS — R0600 Dyspnea, unspecified: Secondary | ICD-10-CM | POA: Diagnosis not present

## 2022-08-26 DIAGNOSIS — Z7982 Long term (current) use of aspirin: Secondary | ICD-10-CM | POA: Diagnosis not present

## 2022-08-26 DIAGNOSIS — J111 Influenza due to unidentified influenza virus with other respiratory manifestations: Secondary | ICD-10-CM | POA: Diagnosis not present

## 2022-08-26 DIAGNOSIS — Z801 Family history of malignant neoplasm of trachea, bronchus and lung: Secondary | ICD-10-CM | POA: Diagnosis not present

## 2022-08-26 DIAGNOSIS — Z885 Allergy status to narcotic agent status: Secondary | ICD-10-CM | POA: Diagnosis not present

## 2022-08-26 DIAGNOSIS — R059 Cough, unspecified: Secondary | ICD-10-CM | POA: Diagnosis not present

## 2022-08-26 DIAGNOSIS — Z841 Family history of disorders of kidney and ureter: Secondary | ICD-10-CM | POA: Diagnosis not present

## 2022-08-26 DIAGNOSIS — E7800 Pure hypercholesterolemia, unspecified: Secondary | ICD-10-CM | POA: Diagnosis not present

## 2022-08-26 DIAGNOSIS — I10 Essential (primary) hypertension: Secondary | ICD-10-CM | POA: Diagnosis not present

## 2022-08-26 DIAGNOSIS — Z87891 Personal history of nicotine dependence: Secondary | ICD-10-CM | POA: Diagnosis not present

## 2022-08-26 DIAGNOSIS — Z1152 Encounter for screening for COVID-19: Secondary | ICD-10-CM | POA: Diagnosis not present

## 2022-08-27 ENCOUNTER — Telehealth: Payer: Self-pay | Admitting: *Deleted

## 2022-08-27 NOTE — Telephone Encounter (Signed)
Transition Care Management Unsuccessful Follow-up Telephone Call  Date of discharge and from where:  08/26/22 from [facility] ED  Attempts:  1st Attempt  Reason for unsuccessful TCM follow-up call:  Left voice message   Needs ED follow up appt scheduled .

## 2022-08-30 ENCOUNTER — Other Ambulatory Visit: Payer: Self-pay

## 2022-09-10 ENCOUNTER — Other Ambulatory Visit: Payer: Self-pay | Admitting: Internal Medicine

## 2022-09-20 ENCOUNTER — Other Ambulatory Visit: Payer: Self-pay | Admitting: Internal Medicine

## 2022-09-26 ENCOUNTER — Inpatient Hospital Stay (HOSPITAL_BASED_OUTPATIENT_CLINIC_OR_DEPARTMENT_OTHER): Payer: 59 | Admitting: Oncology

## 2022-09-26 ENCOUNTER — Inpatient Hospital Stay: Payer: 59 | Attending: Oncology

## 2022-09-26 ENCOUNTER — Encounter: Payer: Self-pay | Admitting: Oncology

## 2022-09-26 VITALS — BP 137/77 | HR 53 | Temp 97.7°F | Resp 18 | Wt 220.8 lb

## 2022-09-26 DIAGNOSIS — Z08 Encounter for follow-up examination after completed treatment for malignant neoplasm: Secondary | ICD-10-CM | POA: Diagnosis not present

## 2022-09-26 DIAGNOSIS — Z85528 Personal history of other malignant neoplasm of kidney: Secondary | ICD-10-CM

## 2022-09-26 DIAGNOSIS — G8929 Other chronic pain: Secondary | ICD-10-CM | POA: Insufficient documentation

## 2022-09-26 DIAGNOSIS — M549 Dorsalgia, unspecified: Secondary | ICD-10-CM | POA: Diagnosis not present

## 2022-09-26 LAB — CBC WITH DIFFERENTIAL/PLATELET
Abs Immature Granulocytes: 0.01 10*3/uL (ref 0.00–0.07)
Basophils Absolute: 0 10*3/uL (ref 0.0–0.1)
Basophils Relative: 0 %
Eosinophils Absolute: 0.1 10*3/uL (ref 0.0–0.5)
Eosinophils Relative: 2 %
HCT: 39.7 % (ref 36.0–46.0)
Hemoglobin: 12.6 g/dL (ref 12.0–15.0)
Immature Granulocytes: 0 %
Lymphocytes Relative: 40 %
Lymphs Abs: 1.9 10*3/uL (ref 0.7–4.0)
MCH: 29.9 pg (ref 26.0–34.0)
MCHC: 31.7 g/dL (ref 30.0–36.0)
MCV: 94.3 fL (ref 80.0–100.0)
Monocytes Absolute: 0.4 10*3/uL (ref 0.1–1.0)
Monocytes Relative: 8 %
Neutro Abs: 2.4 10*3/uL (ref 1.7–7.7)
Neutrophils Relative %: 50 %
Platelets: 162 10*3/uL (ref 150–400)
RBC: 4.21 MIL/uL (ref 3.87–5.11)
RDW: 13.1 % (ref 11.5–15.5)
WBC: 4.7 10*3/uL (ref 4.0–10.5)
nRBC: 0 % (ref 0.0–0.2)

## 2022-09-26 LAB — COMPREHENSIVE METABOLIC PANEL
ALT: 20 U/L (ref 0–44)
AST: 25 U/L (ref 15–41)
Albumin: 4 g/dL (ref 3.5–5.0)
Alkaline Phosphatase: 74 U/L (ref 38–126)
Anion gap: 11 (ref 5–15)
BUN: 16 mg/dL (ref 8–23)
CO2: 23 mmol/L (ref 22–32)
Calcium: 9.1 mg/dL (ref 8.9–10.3)
Chloride: 102 mmol/L (ref 98–111)
Creatinine, Ser: 0.99 mg/dL (ref 0.44–1.00)
GFR, Estimated: >60 mL/min (ref >60)
Glucose, Bld: 96 mg/dL (ref 70–99)
Potassium: 4 mmol/L (ref 3.5–5.1)
Sodium: 136 mmol/L (ref 135–145)
Total Bilirubin: 0.7 mg/dL (ref 0.3–1.2)
Total Protein: 6.7 g/dL (ref 6.5–8.1)

## 2022-09-26 LAB — LACTATE DEHYDROGENASE: LDH: 142 U/L (ref 98–192)

## 2022-09-26 NOTE — Progress Notes (Signed)
Pt here for follow up. No new concerns voiced.   

## 2022-09-26 NOTE — Progress Notes (Signed)
Hematology/Oncology follow up note  [phone] [phone]  REASON FOR VISIT Follow up for treatment of renal cell carcinoma  ASSESSMENT & PLAN:   Personal history of renal cell carcinoma # History of Stage II renal cell carcinoma, radical nephrectomy 07/24/2015  09/21/2020 CT showed no evidence of disease recurrence or metastatic disease. It has been slightly more than 7 years since her nephrectomy surgery. Labs reviewed and discussed with patient.  Stable LDH. Discussed about future images will be clinically indicated. Patient prefers to continue follow-up with oncology annually.  She will return in a year  Orders Placed This Encounter  Procedures   CBC with Differential/Platelet    Standing Status:   Future    Standing Expiration Date:   09/27/2023   Comprehensive metabolic panel    Standing Status:   Future    Standing Expiration Date:   09/27/2023   Lactate dehydrogenase    Standing Status:   Future    Standing Expiration Date:   09/27/2023   Follow-up in 1 year All questions were answered. The patient knows to call the clinic with any problems, questions or concerns.  [name], MD, PhD [facility] Hematology Oncology 09/26/2022    HISTORY OF PRESENTING ILLNESS:  Jacqueline Nichols is a  67 y.o.  female with PMH listed below who was referred to me for evaluation of history of renal cell carcinoma Patient follows up with [facility] urology and want to switch her care to local. Extensive medical chart review was performed by me. History of Stage II left renal cell carcinoma.  In 2016, she was found to have a left renal mass 7.9 cm x 2.6 cm x 9.3 cm.  Underwent laparoscopic left radical nephrectomy with Dr.Matthew Reyna on 07/18/2015.  Per Note in care everywhere, patient has clear cell renal cell carcinoma, ISU P nuclear grade 2, pT2a, margins of resections are negative for tumor, no metastatic carcinoma identified in 1 hilar lymph nodes (0/1), background kidney  with scattered sclerotic glomeruli; histologically unremarkable adrenal gland.    Former smoker  INTERVAL HISTORY Jacqueline Nichols is a 67 y.o. female who has above history reviewed by me today presents for follow up visit for management of history of renal cell carcinoma  Chronic back pain has improved.  Patient reports feeling well.  Denies any unintentional weight loss, night sweats, fever, abdominal pain, blood in urine.    Review of Systems  Constitutional:  Negative for chills, fever, malaise/fatigue and weight loss.  HENT:  Negative for sore throat.   Eyes:  Negative for redness.  Respiratory:  Negative for cough, shortness of breath and wheezing.   Cardiovascular:  Negative for chest pain, palpitations and leg swelling.  Gastrointestinal:  Negative for abdominal pain, blood in stool, nausea and vomiting.  Genitourinary:  Negative for dysuria.  Musculoskeletal:  Positive for back pain. Negative for myalgias.  Skin:  Negative for rash.  Neurological:  Negative for dizziness, tingling and tremors.  Endo/Heme/Allergies:  Does not bruise/bleed easily.  Psychiatric/Behavioral:  Negative for hallucinations.     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   Urinary incontinence  SURGICAL HISTORY: Past Surgical History:  Procedure Laterality Date   CHOLECYSTECTOMY  2001   COLONOSCOPY WITH PROPOFOL N/A 03/27/2016   Procedure: COLONOSCOPY WITH PROPOFOL;  Surgeon: Lucilla Lame, MD;  Location: [facility] ENDOSCOPY;  Service: Endoscopy;  Laterality: N/A;   COLONOSCOPY WITH PROPOFOL N/A 08/31/2021   Procedure: COLONOSCOPY WITH PROPOFOL;  Surgeon: Lucilla Lame, MD;  Location: [place];  Service: Endoscopy;  Laterality: N/A;   KNEE  SURGERY  2003-2009   s/p 8 knee surgeries (with 3 knee replacements)   NEPHRECTOMY RADICAL  34/1937   NISSEN FUNDOPLICATION      SOCIAL HISTORY: Social History   Socioeconomic History   Marital status: Divorced    Spouse name: Not on file   Number of children: 2   Years of education: Not on file   Highest education level: Not on file  Occupational History   Not on file  Tobacco Use   Smoking status: Former    Packs/day: 1.00    Years: 15.00    Total pack years: 15.00    Types: Cigarettes    Quit date: 10/23/2012    Years since quitting: 9.9   Smokeless tobacco: Never   Tobacco comments:    Quit Feb 2014  Vaping Use   Vaping Use: Former   Start date: 02/20/2013   Quit date: 02/20/2014  Substance and Sexual Activity   Alcohol use: Yes    Alcohol/week: 1.0 standard drink of alcohol    Types: 1 Glasses of wine per week    Comment: social drinking - approx 2-4 monthly    Drug use: No   Sexual activity: Not Currently  Other Topics Concern   Not on file  Social History Narrative   Not on file   Social Determinants of Health   Financial Resource Strain: Not on file  Food Insecurity: Not on file  Transportation Needs: Not on file  Physical Activity: Not on file  Stress: Not on file  Social Connections: Not on file  Intimate Partner Violence: Not on file    FAMILY HISTORY: Family History  Problem Relation Age of Onset   Colon cancer Mother        died - age 21   Heart disease Father    Hypertension Father    Lymphoma Father    Arthritis/Rheumatoid Sister    Lung cancer Brother        smoked   Arthritis/Rheumatoid Paternal Grandmother    Cancer Maternal Uncle        Unknown type of cancer    Cancer Paternal Aunt        Unknown type of cancer    Throat cancer Paternal Uncle    Colon cancer Paternal Uncle    Breast cancer Neg Hx     ALLERGIES:  is allergic to codeine and vicodin [hydrocodone-acetaminophen].  MEDICATIONS:  Current Outpatient Medications   Medication Sig Dispense Refill   bisoprolol-hydrochlorothiazide (ZIAC) 2.5-6.25 MG tablet TAKE 1 TABLET BY MOUTH DAILY FOR HIGH BLOOD PRESSURE 90 tablet 3   citalopram (CELEXA) 40 MG tablet TAKE 1 TABLET BY MOUTH DAILY 90 tablet 1   clonazePAM (KLONOPIN) 0.5 MG tablet TAKE 1/2 TO 1 TABLET BY MOUTH EVERY DAY AS NEEDED 30 tablet 0   clopidogrel (PLAVIX) 75 MG tablet TAKE 1 TABLET BY MOUTH EVERY DAY 90 tablet 3   diphenoxylate-atropine (LOMOTIL) 2.5-0.025 MG tablet TAKE 1 TABLET BY MOUTH 4 TIMES DAILY AS NEEDED FOR DIARRHEA OR LOOSE STOOLS 120 tablet 5   gabapentin (NEURONTIN)  300 MG capsule TAKE 1 CAPSULE BY MOUTH AT BEDTIME 30 capsule 2   Loperamide HCl (IMODIUM PO) Take by mouth as needed.     meloxicam (MOBIC) 7.5 MG tablet TAKE 1 TABLET BY MOUTH TWICE A DAY 60 tablet 5   Misc Natural Products (ALLERGY RELEAF SYSTEM PO) Take by mouth. Reported on 03/20/2016     nystatin cream (MYCOSTATIN) Apply 1 application topically 2 (two) times daily. 30 g 0   rosuvastatin (CRESTOR) 5 MG tablet TAKE 1 TABLET BY MOUTH DAILY 90 tablet 1   tiZANidine (ZANAFLEX) 4 MG tablet TAKE 1 TABLET BY MOUTH AT BEDTIME AS NEEDED FOR MUSCLE SPASMS 30 tablet 0   torsemide (DEMADEX) 20 MG tablet TAKE 1 TABLET BY MOUTH EVERY DAY AS NEEDED 90 tablet 1   vortioxetine HBr (TRINTELLIX) 10 MG TABS tablet Take 1 tablet (10 mg total) by mouth daily. 30 tablet 2   butalbital-acetaminophen-caffeine (FIORICET) 50-325-40 MG tablet TAKE ONE TABLET EACH DAY AS NEEDED FOR HEADACHE (Patient not taking: Reported on 09/26/2022) 15 tablet 0   No current facility-administered medications for this visit.     PHYSICAL EXAMINATION: ECOG PERFORMANCE STATUS: 0 - Asymptomatic Vitals:   09/26/22 1356  BP: 137/77  Pulse: (!) 53  Resp: 18  Temp: 97.7 F (36.5 C)   Filed Weights   09/26/22 1356  Weight: 220 lb 12.8 oz (100.2 kg)    Physical Exam Constitutional:      General: She is not in acute distress. HENT:     Head: Normocephalic  and atraumatic.  Eyes:     General: No scleral icterus.    Conjunctiva/sclera: Conjunctivae normal.     Pupils: Pupils are equal, round, and reactive to light.  Cardiovascular:     Rate and Rhythm: Normal rate.  Pulmonary:     Effort: Pulmonary effort is normal. No respiratory distress.     Breath sounds: Normal breath sounds. No wheezing.  Abdominal:     General: Bowel sounds are normal. There is no distension.     Palpations: Abdomen is soft. There is no mass.     Tenderness: There is no abdominal tenderness.  Musculoskeletal:        General: No deformity. Normal range of motion.     Cervical back: Normal range of motion and neck supple.  Lymphadenopathy:     Cervical: No cervical adenopathy.  Skin:    General: Skin is warm and dry.     Findings: No erythema or rash.  Neurological:     Mental Status: She is alert and oriented to person, place, and time. Mental status is at baseline.     Cranial Nerves: No cranial nerve deficit.     Coordination: Coordination normal.  Psychiatric:        Mood and Affect: Mood normal.      LABORATORY DATA:  I have reviewed the data as listed Lab Results  Component Value Date   WBC 4.7 09/26/2022   HGB 12.6 09/26/2022   HCT 39.7 09/26/2022   MCV 94.3 09/26/2022   PLT 162 09/26/2022   Recent Labs    11/01/21 0831 06/28/22 1304 09/26/22 1336  NA 140 137 136  K 4.8 4.6 4.0  CL 102 100 102  CO2 '23 22 23  '$ GLUCOSE 96 82 96  BUN '13 11 16  '$ CREATININE 0.92 0.89 0.99  CALCIUM 10.0 9.4 9.1  GFRNONAA  --   --  >60  PROT 6.1 6.2 6.7  ALBUMIN 4.3 4.3 4.0  AST '28 15 25  '$ ALT '23 13 20  '$ ALKPHOS 99 97 74  BILITOT 0.5 0.4 0.7  BILIDIR 0.15 0.16  --

## 2022-09-26 NOTE — Assessment & Plan Note (Addendum)
#  History of Stage II renal cell carcinoma, radical nephrectomy 07/24/2015  09/21/2020 CT showed no evidence of disease recurrence or metastatic disease. It has been slightly more than 7 years since her nephrectomy surgery. Labs reviewed and discussed with patient.  Stable LDH. Discussed about future images will be clinically indicated. Patient prefers to continue follow-up with oncology annually.  She will return in a year

## 2022-10-04 ENCOUNTER — Other Ambulatory Visit: Payer: Self-pay

## 2022-10-14 ENCOUNTER — Other Ambulatory Visit: Payer: Self-pay | Admitting: Internal Medicine

## 2022-10-16 ENCOUNTER — Ambulatory Visit (INDEPENDENT_AMBULATORY_CARE_PROVIDER_SITE_OTHER): Payer: 59 | Admitting: Internal Medicine

## 2022-10-16 ENCOUNTER — Encounter: Payer: Self-pay | Admitting: Internal Medicine

## 2022-10-16 VITALS — BP 132/72 | HR 59 | Temp 98.0°F | Resp 15 | Ht 70.0 in | Wt 221.6 lb

## 2022-10-16 DIAGNOSIS — E538 Deficiency of other specified B group vitamins: Secondary | ICD-10-CM | POA: Diagnosis not present

## 2022-10-16 DIAGNOSIS — F320 Major depressive disorder, single episode, mild: Secondary | ICD-10-CM

## 2022-10-16 DIAGNOSIS — D509 Iron deficiency anemia, unspecified: Secondary | ICD-10-CM | POA: Diagnosis not present

## 2022-10-16 DIAGNOSIS — E7800 Pure hypercholesterolemia, unspecified: Secondary | ICD-10-CM

## 2022-10-16 DIAGNOSIS — E1165 Type 2 diabetes mellitus with hyperglycemia: Secondary | ICD-10-CM

## 2022-10-16 DIAGNOSIS — I700 Atherosclerosis of aorta: Secondary | ICD-10-CM | POA: Diagnosis not present

## 2022-10-16 DIAGNOSIS — I10 Essential (primary) hypertension: Secondary | ICD-10-CM

## 2022-10-16 DIAGNOSIS — Z8679 Personal history of other diseases of the circulatory system: Secondary | ICD-10-CM

## 2022-10-16 DIAGNOSIS — R739 Hyperglycemia, unspecified: Secondary | ICD-10-CM

## 2022-10-16 DIAGNOSIS — K219 Gastro-esophageal reflux disease without esophagitis: Secondary | ICD-10-CM

## 2022-10-16 DIAGNOSIS — R059 Cough, unspecified: Secondary | ICD-10-CM

## 2022-10-16 DIAGNOSIS — Z85528 Personal history of other malignant neoplasm of kidney: Secondary | ICD-10-CM

## 2022-10-16 LAB — LIPID PANEL
Cholesterol: 150 mg/dL (ref 0–200)
HDL: 47.7 mg/dL (ref >39.00)
LDL Cholesterol: 83 mg/dL (ref 0–99)
NonHDL: 102.26
Total CHOL/HDL Ratio: 3
Triglycerides: 97 mg/dL (ref 0.0–149.0)
VLDL: 19.4 mg/dL (ref 0.0–40.0)

## 2022-10-16 LAB — HEMOGLOBIN A1C: Hgb A1c MFr Bld: 5.5 % (ref 4.6–6.5)

## 2022-10-16 LAB — VITAMIN B12: Vitamin B-12: 577 pg/mL (ref 211–911)

## 2022-10-16 LAB — TSH: TSH: 1.55 u[IU]/mL (ref 0.35–5.50)

## 2022-10-16 NOTE — Progress Notes (Signed)
Subjective:    Patient ID: Jacqueline Nichols, female    DOB: 07-15-1956, 67 y.o.   MRN: [id number]  Patient here for  Chief Complaint  Patient presents with   Medical Management of Chronic Issues    HPI Follow up regarding her increased stress, hypertension and diabetes. Diagnosed with flu 08/26/22.  Feeling better.  Some increased drainage.  No sore throat.  Some persistent cough.  Occasional acid reflux.  Bowels ok.  No chest pain or sob reported.  On trintilex.  Increased to 49m last visit.  Feels may have helped some, but struggles with motivation. Evaluated Dr YTasia Catchings1/25/24 - f/u [facility]. Stable.    Past Medical History:  Diagnosis [place]   [place]    [place]   Chicken pox    Colon polyps    Depression    Diabetes mellitus (HCC)    GERD (gastroesophageal reflux disease)    History of kidney cancer    Hyperlipidemia    Hypertension    Stroke ([facility]    brainstem   Urinary incontinence    Past Surgical History:  Procedure Laterality Date   CHOLECYSTECTOMY  2001   COLONOSCOPY WITH PROPOFOL N/A 03/27/2016   Procedure: COLONOSCOPY WITH PROPOFOL;  Surgeon: DLucilla Lame MD;  Location: [facility] ENDOSCOPY;  Service: Endoscopy;  Laterality: N/A;   COLONOSCOPY WITH PROPOFOL N/A 08/31/2021   Procedure: COLONOSCOPY WITH PROPOFOL;  Surgeon: WLucilla Lame MD;  [place]  Service: Endoscopy;  Laterality: N/A;   KNEE SURGERY  2003-2009   s/p 8 knee surgeries (with 3 knee replacements)   NEPHRECTOMY RADICAL  [id number]  NISSEN FUNDOPLICATION     Family History  Problem Relation Age of Onset   Colon cancer Mother        died - age 65  Heart disease Father    Hypertension Father    Lymphoma Father    Arthritis/Rheumatoid Sister    Lung cancer Brother        smoked   Arthritis/Rheumatoid Paternal Grandmother    Cancer Maternal Uncle        Unknown type of cancer    Cancer Paternal Aunt        Unknown type of cancer    Throat cancer Paternal Uncle    Colon cancer  Paternal Uncle    Breast cancer Neg Hx    Social History   Socioeconomic History   Marital status: Divorced    Spouse name: Not on file   Number of children: 2   Years of education: Not on file   Highest education level: Not on file  Occupational History   Not on file  Tobacco Use   Smoking status: Former    Packs/day: 1.00    Years: 15.00    Total pack years: 15.00    Types: Cigarettes    Quit date: 10/23/2012    Years since quitting: 9.9   Smokeless tobacco: Never   Tobacco comments:    Quit Feb 2014  Vaping Use   Vaping Use: Former   Start date: 02/20/2013   Quit date: 02/20/2014  Substance and Sexual Activity   Alcohol use: Yes    Alcohol/week: 1.0 standard drink of alcohol    Types: 1 Glasses of wine per week    Comment: social drinking - approx 2-4 monthly    Drug use: No   Sexual activity: Not Currently  Other Topics Concern   Not on file  Social History Narrative   Not  on file   Social Determinants of Health   Financial Resource Strain: Not on file  Food Insecurity: Not on file  Transportation Needs: Not on file  Physical Activity: Not on file  Stress: Not on file  Social Connections: Not on file     Review of Systems  Constitutional:  Negative for appetite change and unexpected weight change.  HENT:  Positive for postnasal drip. Negative for sore throat.   Respiratory:  Positive for cough. Negative for chest tightness and shortness of breath.   Cardiovascular:  Negative for chest pain, palpitations and leg swelling.  Gastrointestinal:  Negative for abdominal pain, diarrhea, nausea and vomiting.  Genitourinary:  Negative for difficulty urinating and dysuria.  Musculoskeletal:  Negative for joint swelling and myalgias.  Skin:  Negative for color change and rash.  Neurological:  Negative for dizziness and headaches.  Psychiatric/Behavioral:  Negative for agitation and dysphoric mood.        Objective:     BP 132/72   Pulse (!) 59   Temp 98 F  (36.7 C) (Temporal)   Resp 15   Ht 5' 10"$  (1.778 m)   Wt 221 lb 9.6 oz (100.5 kg)   SpO2 97%   BMI 31.80 kg/m  Wt Readings from Last 3 Encounters:  10/16/22 221 lb 9.6 oz (100.5 kg)  09/26/22 220 lb 12.8 oz (100.2 kg)  08/09/22 219 lb 3.2 oz (99.4 kg)    Physical Exam Vitals reviewed.  Constitutional:      General: She is not in acute distress.    Appearance: Normal appearance.  HENT:     Head: Normocephalic and atraumatic.     Right Ear: External ear normal.     Left Ear: External ear normal.  Eyes:     General: No scleral icterus.       Right eye: No discharge.        Left eye: No discharge.     Conjunctiva/sclera: Conjunctivae normal.  Neck:     Thyroid: No thyromegaly.  Cardiovascular:     Rate and Rhythm: Normal rate and regular rhythm.  Pulmonary:     Effort: No respiratory distress.     Breath sounds: Normal breath sounds. No wheezing.  Abdominal:     General: Bowel sounds are normal.     Palpations: Abdomen is soft.     Tenderness: There is no abdominal tenderness.  Musculoskeletal:        General: No swelling or tenderness.     Cervical back: Neck supple. No tenderness.  Lymphadenopathy:     Cervical: No cervical adenopathy.  Skin:    Findings: No erythema or rash.  Neurological:     Mental Status: She is alert.  Psychiatric:        Mood and Affect: Mood normal.        Behavior: Behavior normal.      Outpatient Encounter Medications as of 10/16/2022  Medication Sig   bisoprolol-hydrochlorothiazide (ZIAC) 2.5-6.25 MG tablet TAKE 1 TABLET BY MOUTH DAILY FOR HIGH BLOOD PRESSURE   citalopram (CELEXA) 40 MG tablet TAKE 1 TABLET BY MOUTH DAILY   clonazePAM (KLONOPIN) 0.5 MG tablet TAKE 1/2 TO 1 TABLET BY MOUTH EVERY DAY AS NEEDED   clopidogrel (PLAVIX) 75 MG tablet TAKE 1 TABLET BY MOUTH EVERY DAY   diphenoxylate-atropine (LOMOTIL) 2.5-0.025 MG tablet TAKE 1 TABLET BY MOUTH 4 TIMES DAILY AS NEEDED FOR DIARRHEA OR LOOSE STOOLS   gabapentin (NEURONTIN)  300 MG capsule TAKE 1 CAPSULE BY MOUTH  AT BEDTIME   Loperamide HCl (IMODIUM PO) Take by mouth as needed.   meloxicam (MOBIC) 7.5 MG tablet TAKE 1 TABLET BY MOUTH TWICE A DAY   Misc Natural Products (ALLERGY RELEAF SYSTEM PO) Take by mouth. Reported on 03/20/2016   nystatin cream (MYCOSTATIN) Apply 1 application topically 2 (two) times daily.   rosuvastatin (CRESTOR) 5 MG tablet TAKE 1 TABLET BY MOUTH DAILY   tiZANidine (ZANAFLEX) 4 MG tablet TAKE 1 TABLET BY MOUTH AT BEDTIME AS NEEDED FOR MUSCLE SPASMS   torsemide (DEMADEX) 20 MG tablet TAKE 1 TABLET BY MOUTH EVERY DAY AS NEEDED   vortioxetine HBr (TRINTELLIX) 10 MG TABS tablet Take 1 tablet (10 mg total) by mouth daily.   [DISCONTINUED] butalbital-acetaminophen-caffeine (FIORICET) 50-325-40 MG tablet TAKE ONE TABLET EACH DAY AS NEEDED FOR HEADACHE   No facility-administered encounter medications on file as of 10/16/2022.     Lab Results  Component Value Date   WBC 4.7 09/26/2022   HGB 12.6 09/26/2022   HCT 39.7 09/26/2022   PLT 162 09/26/2022   GLUCOSE 96 09/26/2022   CHOL 150 10/16/2022   TRIG 97.0 10/16/2022   HDL 47.70 10/16/2022   LDLCALC 83 10/16/2022   ALT 20 09/26/2022   AST 25 09/26/2022   NA 136 09/26/2022   K 4.0 09/26/2022   CL 102 09/26/2022   CREATININE 0.99 09/26/2022   BUN 16 09/26/2022   CO2 23 09/26/2022   TSH 1.55 10/16/2022   HGBA1C 5.5 10/16/2022    MM 3D SCREEN BREAST BILATERAL  Result Date: 08/19/2022 CLINICAL DATA:  Screening. EXAM: DIGITAL SCREENING BILATERAL MAMMOGRAM WITH TOMOSYNTHESIS AND CAD TECHNIQUE: Bilateral screening digital craniocaudal and mediolateral oblique mammograms were obtained. Bilateral screening digital breast tomosynthesis was performed. The images were evaluated with computer-aided detection. COMPARISON:  Previous exam(s). ACR Breast Density Category a: The breast tissue is almost entirely fatty. FINDINGS: There are no findings suspicious for malignancy. IMPRESSION: No mammographic  evidence of malignancy. A result letter of this screening mammogram will be mailed directly to the patient. RECOMMENDATION: Screening mammogram in one year. (Code:SM-B-01Y) BI-RADS CATEGORY  1: Negative. Electronically Signed   By: Margarette Canada M.D.   On: 08/19/2022 16:38       Assessment & Plan:  B12 deficiency Assessment & Plan: On B12 supplements.  Check B12 level.   Orders: -     Vitamin B12  Hypercholesteremia Assessment & Plan: Continue crestor.   Orders: -     TSH -     Lipid panel  Hyperglycemia -     Hemoglobin A1c  Iron deficiency anemia, unspecified iron deficiency anemia type Assessment & Plan: Follow cbc.    Aortic atherosclerosis ([place]) Assessment & Plan: Continue crestor.    Depression, major, single episode, mild ([place]) Assessment & Plan: On citalopram. trintilex increased to 3m q day last visit.  Discussed referral to psychiatry to help with medication management.  Will let me know if desires referral.      Type 2 diabetes mellitus with hyperglycemia, without long-term current use of insulin (HCC) Assessment & Plan: Low carb diet and exercise.  Follow met b and a1c.   Gastroesophageal reflux disease, unspecified whether esophagitis present Assessment & Plan: Occasional acid reflux.  Avoid foods that aggravate.  If persistent - pepcid.    H/O supraventricular tachycardia Assessment & Plan: S/p ablation.  Has seen cardiology.  Stable.  Follow.     History of renal cell carcinoma Assessment & Plan: S/p left radical nephrectomy and partial adrenalectomy.  Seeing Dr Tasia Catchings.  Stable.  Recommended f/u in one year.    Primary hypertension Assessment & Plan: Continue on torsemide and ziac.  Blood pressure as outlined.  Follow pressures.  Follow metabolic panel.    Personal history of renal cell carcinoma Assessment & Plan: Dr Tasia Catchings 09/26/22 - recommend f/u with oncology in one year.    Cough, unspecified type Assessment & Plan: Recent flu.  Feeling  better.  Robitussin DM/delsym if needed.  Follow.       Einar Pheasant, MD

## 2022-10-17 ENCOUNTER — Telehealth: Payer: Self-pay

## 2022-10-17 MED ORDER — NYSTATIN 100000 UNIT/GM EX POWD: 1.0000 | Freq: Two times a day (BID) | CUTANEOUS | 0 refills | Status: AC

## 2022-10-17 NOTE — Telephone Encounter (Signed)
Pt aware.

## 2022-10-17 NOTE — Telephone Encounter (Signed)
Rx sent in for nystatin powder.  Sorry for the delay.

## 2022-10-20 ENCOUNTER — Encounter: Payer: Self-pay | Admitting: Internal Medicine

## 2022-10-20 DIAGNOSIS — R059 Cough, unspecified: Secondary | ICD-10-CM | POA: Insufficient documentation

## 2022-10-20 NOTE — Assessment & Plan Note (Signed)
Dr Tasia Catchings 09/26/22 - recommend f/u with oncology in one year.

## 2022-10-20 NOTE — Assessment & Plan Note (Signed)
Occasional acid reflux.  Avoid foods that aggravate.  If persistent - pepcid.

## 2022-10-20 NOTE — Assessment & Plan Note (Signed)
Continue crestor 

## 2022-10-20 NOTE — Assessment & Plan Note (Addendum)
Continue on torsemide and ziac.  Blood pressure as outlined.  Follow pressures.  Follow metabolic panel.

## 2022-10-20 NOTE — Assessment & Plan Note (Signed)
On B12 supplements.  Check B12 level.

## 2022-10-20 NOTE — Assessment & Plan Note (Signed)
S/p left radical nephrectomy and partial adrenalectomy.  Seeing Dr Tasia Catchings.  Stable.  Recommended f/u in one year.

## 2022-10-20 NOTE — Assessment & Plan Note (Signed)
On citalopram. trintilex increased to 20m q day last visit.  Discussed referral to psychiatry to help with medication management.  Will let me know if desires referral.

## 2022-10-20 NOTE — Assessment & Plan Note (Signed)
Recent flu.  Feeling better.  Robitussin DM/delsym if needed.  Follow.

## 2022-10-20 NOTE — Assessment & Plan Note (Signed)
S/p ablation.  Has seen cardiology.  Stable.  Follow.

## 2022-10-20 NOTE — Assessment & Plan Note (Signed)
Low carb diet and exercise.  Follow met b and a1c.   

## 2022-10-20 NOTE — Assessment & Plan Note (Signed)
Follow cbc.  

## 2022-10-29 ENCOUNTER — Other Ambulatory Visit: Payer: Self-pay

## 2022-11-13 ENCOUNTER — Telehealth: Payer: Self-pay | Admitting: Internal Medicine

## 2022-11-13 NOTE — Telephone Encounter (Signed)
Prescription Request  11/13/2022  LOV: 10/16/2022  What is the name of the medication or equipment? tiZANidine (ZANAFLEX) 4 MG tablet  Have you contacted your pharmacy to request a refill? Yes   Which pharmacy would you like this sent to?   [place], [place] [address] [place] [address] Phone: [phone] Fax: [phone]      Patient notified that their request is being sent to the clinical staff for review and that they should receive a response within 2 business days.   Please advise at Mobile [phone] (mobile)

## 2022-11-14 NOTE — Telephone Encounter (Signed)
Ok to refill? Uses PRN. Last refill 10/14/22

## 2022-11-15 NOTE — Telephone Encounter (Signed)
Called patient she takes both Gabapentin and the tizanidine at once nightly the gaba for sleep and the tizanidine for muscle pain in back .

## 2022-11-15 NOTE — Telephone Encounter (Signed)
Just refilled 10/14/22.  How often is she taking?  Is she taking this and the gabapentin? If so, how is she taking them?  Just need to clarify a few things.

## 2022-11-16 MED ORDER — TIZANIDINE HCL 4 MG PO TABS: ORAL_TABLET | ORAL | 0 refills | Status: DC

## 2022-11-16 NOTE — Telephone Encounter (Signed)
Rx ok'd for tizanidine.

## 2022-11-16 NOTE — Addendum Note (Signed)
Addended by: Alisa Graff on: 11/16/2022 02:59 PM   Modules accepted: Orders

## 2022-12-06 ENCOUNTER — Other Ambulatory Visit: Payer: Self-pay | Admitting: Internal Medicine

## 2022-12-06 ENCOUNTER — Other Ambulatory Visit: Payer: Self-pay

## 2022-12-06 MED ORDER — VORTIOXETINE HBR 10 MG PO TABS
10.0000 mg | ORAL_TABLET | Freq: Every day | ORAL | 2 refills | Status: DC
  Filled 2022-12-06: qty 30, 30d supply, fill #0

## 2022-12-09 ENCOUNTER — Other Ambulatory Visit: Payer: Self-pay | Admitting: Internal Medicine

## 2022-12-09 ENCOUNTER — Telehealth: Payer: Self-pay | Admitting: Internal Medicine

## 2022-12-09 ENCOUNTER — Other Ambulatory Visit: Payer: Self-pay

## 2022-12-09 ENCOUNTER — Other Ambulatory Visit: Payer: 59 | Admitting: Pharmacist

## 2022-12-09 DIAGNOSIS — T466X5A Adverse effect of antihyperlipidemic and antiarteriosclerotic drugs, initial encounter: Secondary | ICD-10-CM

## 2022-12-09 DIAGNOSIS — F320 Major depressive disorder, single episode, mild: Secondary | ICD-10-CM

## 2022-12-09 NOTE — Progress Notes (Signed)
12/09/2022 Name: Jacqueline Nichols MRN: [id number] DOB: 1956-01-30  Chief Complaint  Patient presents with   Medication Management    Jacqueline Nichols is a 67 y.o. year old female who presented for a telephone visit.   They were referred to the pharmacist by their PCP for assistance in managing medication access.   Subjective:  Care Team: Primary Care Provider: Dale Hiltonia, MD ; Next Scheduled Visit: 01/17/23  Medication Access/Adherence  Current Pharmacy:  [facility] - [place], [place] - [address] [place] [address] Phone: [phone] Fax: [phone]  [facility] - [place], [place] - [address] [facility] [address] [place] [address] [place] [address] Phone: [phone] Fax: [phone]  [facility] [address] [place] [address] Phone: [phone] Fax: [phone]   Patient reports affordability concerns with their medications: Yes  Patient reports access/transportation concerns to their pharmacy: No  Patient reports adherence concerns with their medications:  No     Depression:  Current medications: citalopram 40 mg daily, Trintellix 10 mg daily, clonazepam 0.25-0.5 mg PRN Medications tried in the past:   Behavioral Health support: ***  Current medication access support: ***   Objective:  Lab Results  Component Value Date   HGBA1C 5.5 10/16/2022    Lab Results  Component Value Date   CREATININE 0.99 09/26/2022   BUN 16 09/26/2022   NA 136 09/26/2022   K 4.0 09/26/2022   CL 102 09/26/2022   CO2 23 09/26/2022    Lab Results  Component Value Date   CHOL 150 10/16/2022   HDL 47.70 10/16/2022   LDLCALC 83 10/16/2022   TRIG 97.0 10/16/2022   CHOLHDL 3 10/16/2022    Medications Reviewed Today     Reviewed by Alden Hipp, RPH-CPP (Pharmacist) on 12/09/22 at 1526  Med List Status: <None>   Medication  Order Taking? Sig Documenting Provider Last Dose Status Informant  bisoprolol-hydrochlorothiazide (ZIAC) 2.5-6.25 MG tablet [id number] Yes TAKE 1 TABLET BY MOUTH DAILY FOR HIGH BLOOD PRESSURE Dale [place], MD Taking Active   citalopram (CELEXA) 40 MG tablet [id number] Yes TAKE 1 TABLET BY MOUTH DAILY Dale [place], MD Taking Active   clonazePAM (KLONOPIN) 0.5 MG tablet [id number] Yes TAKE 1/2 TO 1 TABLET BY MOUTH EVERY DAY AS NEEDED Dale [place], MD Taking Active   clopidogrel (PLAVIX) 75 MG tablet [id number] Yes TAKE 1 TABLET BY MOUTH EVERY DAY Dale [place], MD Taking Active   diphenoxylate-atropine (LOMOTIL) 2.5-0.025 MG tablet [id number]  TAKE 1 TABLET BY MOUTH 4 TIMES DAILY AS NEEDED FOR DIARRHEA OR LOOSE Noe Gens, Darren, MD  Active   gabapentin (NEURONTIN) 300 MG capsule [id number] Yes TAKE 1 CAPSULE BY MOUTH AT BEDTIME Dale [place], MD Taking Active   Loperamide HCl (IMODIUM PO) [id number]  Take by mouth as needed. [provider]  Active Self  meloxicam (MOBIC) 7.5 MG tablet [id number] Yes TAKE 1 TABLET BY MOUTH TWICE A DAY Dale [place], MD Taking Active   Misc Natural Products (ALLERGY RELEAF SYSTEM PO) [id number] Yes Take by mouth. Reported on 03/20/2016 [provider] Taking Active   nystatin (MYCOSTATIN/NYSTOP) powder [id number] Yes Apply 1 Application topically 2 (two) times daily. Dale [place], MD Taking Active   nystatin cream (MYCOSTATIN) [id number] Yes Apply 1 application topically 2 (two) times daily. Dale [place], MD Taking Active   rosuvastatin (CRESTOR) 5 MG tablet [id number] Yes TAKE 1 TABLET BY MOUTH DAILY Scott,  Westley Hummer, MD Taking Active   tiZANidine (ZANAFLEX) 4 MG tablet [id number] Yes TAKE 1 TABLET BY MOUTH AT BEDTIME AS NEEDED FOR MUSCLE SPASMS Dale [place], MD Taking Active   torsemide (DEMADEX) 20 MG tablet [id number] Yes TAKE 1 TABLET BY MOUTH EVERY DAY AS NEEDED Dale Clive, MD Taking Active   vortioxetine HBr (TRINTELLIX) 10 MG TABS  tablet [id number] Yes Take 1 tablet (10 mg total) by mouth daily. Dale [place], MD Taking Active               Assessment/Plan:   {Pharmacy A/P Choices:26421}  Follow Up Plan: ***  ***

## 2022-12-09 NOTE — Telephone Encounter (Signed)
This is the patient that we discussed.  Thanks.

## 2022-12-09 NOTE — Telephone Encounter (Signed)
The patient called stating the insurance will not cover the  vortioxetine HBr (TRINTELLIX) 10 MG TABS tablet. She will have to pay 75.00 for this medication. She had a coupon when she had generic coverage now she is on medicare the coupon will not work. She is requesting another medication equivalent to the Trintellix that [facility] will cover with a lower copay.

## 2022-12-09 NOTE — Telephone Encounter (Signed)
Is there an alternative to her trintellix?

## 2022-12-10 NOTE — Patient Instructions (Signed)
Diane,   Please let me know when you hear from the Medicare Extra Help application.   We'll mail you the Trintellix manufacturer assistance application in case you are denied for Medicare Extra Help.   Thanks!  Catie Eppie Gibson, PharmD, BCACP, CPP [facility] [phone]

## 2022-12-11 ENCOUNTER — Other Ambulatory Visit (HOSPITAL_COMMUNITY): Payer: Self-pay

## 2022-12-11 ENCOUNTER — Telehealth: Payer: Self-pay

## 2022-12-11 NOTE — Telephone Encounter (Signed)
-----   Message from Alden Hipp, RPH-CPP sent at 12/10/2022  3:40 PM EDT ----- Please mail patient portion of [employer] app for Trintellix assistance.   Thanks!  Catie

## 2022-12-11 NOTE — Telephone Encounter (Signed)
Pt portion of application will be mailed out within following business week.

## 2022-12-16 ENCOUNTER — Other Ambulatory Visit: Payer: Self-pay | Admitting: Pharmacist

## 2022-12-16 NOTE — Progress Notes (Signed)
Care Coordination Call  Collaborated with PCP to submit Tier Exception for Trintellix.   Catie Eppie Gibson, PharmD, BCACP, CPP [facility] [phone]

## 2022-12-17 ENCOUNTER — Other Ambulatory Visit: Payer: Self-pay | Admitting: Internal Medicine

## 2022-12-25 ENCOUNTER — Telehealth: Payer: Self-pay | Admitting: Internal Medicine

## 2022-12-25 NOTE — Telephone Encounter (Signed)
Pt called stating she can not afford TRINTELLIX and want to see if she can get something similar that is cheaper

## 2022-12-26 ENCOUNTER — Telehealth: Payer: Self-pay

## 2022-12-26 NOTE — Telephone Encounter (Signed)
Mailed to pt 12/16/2022

## 2022-12-26 NOTE — Telephone Encounter (Signed)
Tier exception was denied. Do you have any updates on her pt assistance

## 2022-12-26 NOTE — Telephone Encounter (Signed)
Contacted patient. She notes she received a letter from social security and was denied for patient assistance. She was upset so she threw it away.   She notes she did receive Trintellix patient assistance application from [employer]. She will work on completing the application. She notes that her tax return for 2023 reflects a job she had last year that she does not have now. 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 She will complete this and bring to the clinic.   Ayla, can we go ahead and send provider portion to Dr. Lorin Picket so we are ready to submit when the patient drops off her portion?  Thanks!

## 2022-12-26 NOTE — Telephone Encounter (Signed)
I have faxed the MD portion to the office.

## 2022-12-26 NOTE — Telephone Encounter (Signed)
-----   Message from Catherine T Harper, RPH-CPP sent at 12/10/2022  3:40 PM EDT ----- Please mail patient portion of [employer] app for Trintellix assistance.   Thanks!  Catie 

## 2022-12-26 NOTE — Telephone Encounter (Signed)
Yes, mailed on the 15th.

## 2022-12-26 NOTE — Telephone Encounter (Signed)
Faxed provider portion to office fax number

## 2022-12-26 NOTE — Telephone Encounter (Signed)
ERROR

## 2022-12-31 NOTE — Telephone Encounter (Signed)
Received and placed out for your signature.

## 2022-12-31 NOTE — Telephone Encounter (Signed)
Signed form and placed in box.   

## 2022-12-31 NOTE — Telephone Encounter (Signed)
Just faxed form back to you

## 2023-01-01 NOTE — Telephone Encounter (Signed)
Received, still waiting on patient portion

## 2023-01-01 NOTE — Telephone Encounter (Signed)
Noted  

## 2023-01-01 NOTE — Telephone Encounter (Signed)
Received provider portion, still waiting on pt portion to arrive.

## 2023-01-17 ENCOUNTER — Ambulatory Visit: Payer: Medicare HMO | Admitting: Internal Medicine

## 2023-01-17 VITALS — BP 122/70 | HR 67 | Temp 98.0°F | Resp 16 | Ht 70.0 in | Wt 224.0 lb

## 2023-01-17 DIAGNOSIS — I10 Essential (primary) hypertension: Secondary | ICD-10-CM | POA: Diagnosis not present

## 2023-01-17 DIAGNOSIS — E1165 Type 2 diabetes mellitus with hyperglycemia: Secondary | ICD-10-CM | POA: Diagnosis not present

## 2023-01-17 DIAGNOSIS — R739 Hyperglycemia, unspecified: Secondary | ICD-10-CM

## 2023-01-17 DIAGNOSIS — F320 Major depressive disorder, single episode, mild: Secondary | ICD-10-CM | POA: Diagnosis not present

## 2023-01-17 DIAGNOSIS — Z85528 Personal history of other malignant neoplasm of kidney: Secondary | ICD-10-CM | POA: Diagnosis not present

## 2023-01-17 DIAGNOSIS — I700 Atherosclerosis of aorta: Secondary | ICD-10-CM

## 2023-01-17 DIAGNOSIS — D509 Iron deficiency anemia, unspecified: Secondary | ICD-10-CM

## 2023-01-17 DIAGNOSIS — M546 Pain in thoracic spine: Secondary | ICD-10-CM | POA: Diagnosis not present

## 2023-01-17 DIAGNOSIS — I471 Supraventricular tachycardia, unspecified: Secondary | ICD-10-CM

## 2023-01-17 DIAGNOSIS — E7800 Pure hypercholesterolemia, unspecified: Secondary | ICD-10-CM | POA: Diagnosis not present

## 2023-01-17 DIAGNOSIS — Z8679 Personal history of other diseases of the circulatory system: Secondary | ICD-10-CM

## 2023-01-17 MED ORDER — GABAPENTIN 300 MG PO CAPS: 300.0000 mg | ORAL_CAPSULE | Freq: Every day | ORAL | 2 refills | Status: DC

## 2023-01-17 MED ORDER — MELOXICAM 7.5 MG PO TABS: 7.5000 mg | ORAL_TABLET | Freq: Two times a day (BID) | ORAL | 5 refills | Status: DC

## 2023-01-17 MED ORDER — ROSUVASTATIN CALCIUM 5 MG PO TABS: 5.0000 mg | ORAL_TABLET | Freq: Every day | ORAL | 1 refills | Status: DC

## 2023-01-17 NOTE — Progress Notes (Unsigned)
Subjective:    Patient ID: Jacqueline Nichols, female    DOB: 1956/03/20, 67 y.o.   MRN: [id number]  Patient here for  Chief Complaint  Patient presents with   Medical Management of Chronic Issues    HPI Follow up regarding her increased stress, hypertension and diabetes. Reports decreased motivation.  Some depression.  Is on citalopram 40mg  q day.  Was taking trintellix.  Felt was helping.  Not covered by insurance.  Attempted to get patient assistance.  Discussed counseling.  Discussed psychiatry referral.  Will notify me if agrees.  No suicidal ideation.  Recently engaged.  Of note, previously tried prozac - did not work.  Has been on wellbutrin previously.  Was questioning restarting wellbutrin.  No chest pain or sob reported.  No abdominal pain.  Still with occasional right back pain.  Aggravated by sleeping on side.  Some knee pain - right > left.  Discussed therapy, etc.     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   Urinary incontinence    Past Surgical History:  Procedure Laterality Date   CHOLECYSTECTOMY  2001   COLONOSCOPY WITH PROPOFOL N/A 03/27/2016   Procedure: COLONOSCOPY WITH PROPOFOL;  Surgeon: Midge Minium, MD;  Location: [facility] ENDOSCOPY;  Service: Endoscopy;  Laterality: N/A;   COLONOSCOPY WITH PROPOFOL N/A 08/31/2021   Procedure: COLONOSCOPY WITH PROPOFOL;  Surgeon: Midge Minium, MD;  Location: [facility];  Service: Endoscopy;  Laterality: N/A;   KNEE SURGERY  2003-2009   s/p 8 knee surgeries (with 3 knee replacements)   NEPHRECTOMY RADICAL  07/2015   NISSEN FUNDOPLICATION     Family History  Problem Relation Age of Onset   Colon cancer Mother        died - age 6   Heart disease Father    Hypertension Father    Lymphoma  Father    Arthritis/Rheumatoid Sister    Lung cancer Brother        smoked   Arthritis/Rheumatoid Paternal Grandmother    Cancer Maternal Uncle        Unknown type of cancer    Cancer Paternal Aunt        Unknown type of cancer    Throat cancer Paternal Uncle    Colon cancer Paternal Uncle    Breast cancer Neg Hx    Social History   Socioeconomic History   Marital status: Divorced    Spouse name: Not on file   Number of children: 2   Years of education: Not on file   Highest education level: Not on file  Occupational History   Not on file  Tobacco Use   Smoking status: Former    Packs/day: 1.00    Years: 15.00    Additional pack years: 0.00    Total pack years: 15.00    Types: Cigarettes    Quit date: 10/23/2012    Years since quitting: 10.2   Smokeless tobacco: Never   Tobacco comments:    Quit Feb 2014  Vaping Use   Vaping Use: Former   Start date: 02/20/2013   Quit date: 02/20/2014  Substance and Sexual Activity   Alcohol use: Yes  Alcohol/week: 1.0 standard drink of alcohol    Types: 1 Glasses of wine per week    Comment: social drinking - approx 2-4 monthly    Drug use: No   Sexual activity: Not Currently  Other Topics Concern   Not on file  Social History Narrative   Not on file   Social Determinants of Health   Financial Resource Strain: Not on file  Food Insecurity: Not on file  Transportation Needs: Not on file  Physical Activity: Not on file  Stress: Not on file  Social Connections: Not on file     Review of Systems  Constitutional:  Negative for appetite change and unexpected weight change.  HENT:  Negative for congestion and sinus pressure.   Respiratory:  Negative for cough, chest tightness and shortness of breath.   Cardiovascular:  Negative for chest pain and palpitations.  Gastrointestinal:  Negative for abdominal pain, diarrhea, nausea and vomiting.  Genitourinary:  Negative for difficulty urinating and dysuria.  Musculoskeletal:   Positive for back pain. Negative for myalgias.  Skin:  Negative for color change and rash.  Neurological:  Negative for dizziness and headaches.  Psychiatric/Behavioral:  Negative for agitation.        Decreased motivation as outlined.        Objective:     BP 122/70   Pulse 67   Temp 98 F (36.7 C)   Resp 16   Ht 5\' 10"  (1.778 m)   Wt 224 lb (101.6 kg)   SpO2 98%   BMI 32.14 kg/m  Wt Readings from Last 3 Encounters:  01/17/23 224 lb (101.6 kg)  10/16/22 221 lb 9.6 oz (100.5 kg)  09/26/22 220 lb 12.8 oz (100.2 kg)    Physical Exam Vitals reviewed.  Constitutional:      General: She is not in acute distress.    Appearance: Normal appearance.  HENT:     Head: Normocephalic and atraumatic.     Right Ear: External ear normal.     Left Ear: External ear normal.  Eyes:     General: No scleral icterus.       Right eye: No discharge.        Left eye: No discharge.     Conjunctiva/sclera: Conjunctivae normal.  Neck:     Thyroid: No thyromegaly.  Cardiovascular:     Rate and Rhythm: Normal rate and regular rhythm.  Pulmonary:     Effort: No respiratory distress.     Breath sounds: Normal breath sounds. No wheezing.  Abdominal:     General: Bowel sounds are normal.     Palpations: Abdomen is soft.     Tenderness: There is no abdominal tenderness.  Musculoskeletal:        General: No swelling or tenderness.     Cervical back: Neck supple. No tenderness.  Lymphadenopathy:     Cervical: No cervical adenopathy.  Skin:    Findings: No erythema or rash.  Neurological:     Mental Status: She is alert.  Psychiatric:        Mood and Affect: Mood normal.        Behavior: Behavior normal.      Outpatient Encounter Medications as of 01/17/2023  Medication Sig   bisoprolol-hydrochlorothiazide (ZIAC) 2.5-6.25 MG tablet TAKE 1 TABLET BY MOUTH DAILY FOR HIGH BLOOD PRESSURE   citalopram (CELEXA) 40 MG tablet TAKE 1 TABLET BY MOUTH DAILY   clonazePAM (KLONOPIN) 0.5 MG tablet  TAKE 1/2 TO 1 TABLET BY MOUTH EVERY  DAY AS NEEDED   clopidogrel (PLAVIX) 75 MG tablet TAKE 1 TABLET BY MOUTH EVERY DAY   diphenoxylate-atropine (LOMOTIL) 2.5-0.025 MG tablet TAKE 1 TABLET BY MOUTH 4 TIMES DAILY AS NEEDED FOR DIARRHEA OR LOOSE STOOLS   gabapentin (NEURONTIN) 300 MG capsule Take 1 capsule (300 mg total) by mouth at bedtime.   Loperamide HCl (IMODIUM PO) Take by mouth as needed.   meloxicam (MOBIC) 7.5 MG tablet Take 1 tablet (7.5 mg total) by mouth 2 (two) times daily.   Misc Natural Products (ALLERGY RELEAF SYSTEM PO) Take by mouth. Reported on 03/20/2016   nystatin (MYCOSTATIN/NYSTOP) powder Apply 1 Application topically 2 (two) times daily.   nystatin cream (MYCOSTATIN) Apply 1 application topically 2 (two) times daily.   rosuvastatin (CRESTOR) 5 MG tablet Take 1 tablet (5 mg total) by mouth daily.   tiZANidine (ZANAFLEX) 4 MG tablet TAKE 1 TABLET BY MOUTH AT BEDTIME AS NEEDED FOR MUSCLE SPASMS   torsemide (DEMADEX) 20 MG tablet TAKE 1 TABLET BY MOUTH EVERY DAY AS NEEDED   [DISCONTINUED] gabapentin (NEURONTIN) 300 MG capsule TAKE 1 CAPSULE BY MOUTH AT BEDTIME   [DISCONTINUED] meloxicam (MOBIC) 7.5 MG tablet TAKE 1 TABLET BY MOUTH TWICE A DAY   [DISCONTINUED] rosuvastatin (CRESTOR) 5 MG tablet TAKE 1 TABLET BY MOUTH DAILY   [DISCONTINUED] vortioxetine HBr (TRINTELLIX) 10 MG TABS tablet Take 1 tablet (10 mg total) by mouth daily. (Patient not taking: Reported on 01/17/2023)   No facility-administered encounter medications on file as of 01/17/2023.     Lab Results  Component Value Date   WBC 6.0 01/17/2023   HGB 13.4 01/17/2023   HCT 40.1 01/17/2023   PLT 186 01/17/2023   GLUCOSE 82 01/17/2023   CHOL 153 01/17/2023   TRIG 102 01/17/2023   HDL 49 01/17/2023   LDLCALC 85 01/17/2023   ALT 16 01/17/2023   AST 20 01/17/2023   NA 137 01/17/2023   K 4.1 01/17/2023   CL 99 01/17/2023   CREATININE 1.00 01/17/2023   BUN 18 01/17/2023   CO2 21 01/17/2023   TSH 1.55 10/16/2022    HGBA1C 5.7 (H) 01/17/2023    MM 3D SCREEN BREAST BILATERAL  Result Date: 08/19/2022 CLINICAL DATA:  Screening. EXAM: DIGITAL SCREENING BILATERAL MAMMOGRAM WITH TOMOSYNTHESIS AND CAD TECHNIQUE: Bilateral screening digital craniocaudal and mediolateral oblique mammograms were obtained. Bilateral screening digital breast tomosynthesis was performed. The images were evaluated with computer-aided detection. COMPARISON:  Previous exam(s). ACR Breast Density Category a: The breast tissue is almost entirely fatty. FINDINGS: There are no findings suspicious for malignancy. IMPRESSION: No mammographic evidence of malignancy. A result letter of this screening mammogram will be mailed directly to the patient. RECOMMENDATION: Screening mammogram in one year. (Code:SM-B-01Y) BI-RADS CATEGORY  1: Negative. Electronically Signed   By: Harmon Pier M.D.   On: 08/19/2022 16:38       Assessment & Plan:  Hypercholesteremia Assessment & Plan: Continue crestor.   Orders: -     Lipid panel -     Hepatic function panel -     Basic metabolic panel -     CBC with Differential/Platelet  Hyperglycemia  Type 2 diabetes mellitus with hyperglycemia, without long-term current use of insulin (HCC) Assessment & Plan: Low carb diet and exercise.  Follow met b and a1c.  Orders: -     Hemoglobin A1c -     Microalbumin / creatinine urine ratio  Iron deficiency anemia, unspecified iron deficiency anemia type Assessment & Plan: Follow cbc.  Aortic atherosclerosis (HCC) Assessment & Plan: Continue crestor.    Right-sided thoracic back pain, unspecified chronicity Assessment & Plan: MRI as previously outlined.  Aggravated by positioning - sleeping on side.  Notify me if desires further intervention.    Depression, major, single episode, mild (HCC) Assessment & Plan: On citalopram. Was on Trintilex. Felt was helping.  Not covered by insurance.  Attempted to get assistance.  Discussed referral to psychiatry  to help with medication management.  Will let me know if desires referral.  Has tried prozac - not help.  Has been on wellbutrin previously.  Does not report adverse effect from wellbutrin.  Continues on citalopram.    H/O supraventricular tachycardia Assessment & Plan: S/p ablation.  Has seen cardiology.  Stable.  Follow.     History of renal cell carcinoma Assessment & Plan: S/p left radical nephrectomy and partial adrenalectomy.  Seeing Dr Cathie Hoops.  Stable.  Recommended f/u in one year.    Primary hypertension Assessment & Plan: Continue on torsemide and ziac.  Blood pressure as outlined.  Follow pressures.  Follow metabolic panel.    SVT (supraventricular tachycardia) Assessment & Plan: Stable on ziac.    Other orders -     Gabapentin; Take 1 capsule (300 mg total) by mouth at bedtime.  Dispense: 30 capsule; Refill: 2 -     Meloxicam; Take 1 tablet (7.5 mg total) by mouth 2 (two) times daily.  Dispense: 60 tablet; Refill: 5 -     Rosuvastatin Calcium; Take 1 tablet (5 mg total) by mouth daily.  Dispense: 90 tablet; Refill: 1     Dale Waldron, MD

## 2023-01-18 ENCOUNTER — Other Ambulatory Visit: Payer: Self-pay | Admitting: Internal Medicine

## 2023-01-18 LAB — CBC WITH DIFFERENTIAL/PLATELET
Basophils Absolute: 0 10*3/uL (ref 0.0–0.2)
Basos: 0 %
EOS (ABSOLUTE): 0.1 10*3/uL (ref 0.0–0.4)
Eos: 1 %
Hematocrit: 40.1 % (ref 34.0–46.6)
Hemoglobin: 13.4 g/dL (ref 11.1–15.9)
Immature Grans (Abs): 0 10*3/uL (ref 0.0–0.1)
Immature Granulocytes: 0 %
Lymphocytes Absolute: 2.2 10*3/uL (ref 0.7–3.1)
Lymphs: 37 %
MCH: 29.8 pg (ref 26.6–33.0)
MCHC: 33.4 g/dL (ref 31.5–35.7)
MCV: 89 fL (ref 79–97)
Monocytes Absolute: 0.3 10*3/uL (ref 0.1–0.9)
Monocytes: 6 %
Neutrophils Absolute: 3.3 10*3/uL (ref 1.4–7.0)
Neutrophils: 56 %
Platelets: 186 10*3/uL (ref 150–450)
RBC: 4.5 x10E6/uL (ref 3.77–5.28)
RDW: 12.7 % (ref 11.7–15.4)
WBC: 6 10*3/uL (ref 3.4–10.8)

## 2023-01-18 LAB — HEPATIC FUNCTION PANEL
ALT: 16 IU/L (ref 0–32)
AST: 20 IU/L (ref 0–40)
Albumin: 4.4 g/dL (ref 3.9–4.9)
Alkaline Phosphatase: 90 IU/L (ref 44–121)
Bilirubin Total: 0.7 mg/dL (ref 0.0–1.2)
Bilirubin, Direct: 0.18 mg/dL (ref 0.00–0.40)
Total Protein: 6.4 g/dL (ref 6.0–8.5)

## 2023-01-18 LAB — BASIC METABOLIC PANEL
BUN/Creatinine Ratio: 18 (ref 12–28)
BUN: 18 mg/dL (ref 8–27)
CO2: 21 mmol/L (ref 20–29)
Calcium: 9.7 mg/dL (ref 8.7–10.3)
Chloride: 99 mmol/L (ref 96–106)
Creatinine, Ser: 1 mg/dL (ref 0.57–1.00)
Glucose: 82 mg/dL (ref 70–99)
Potassium: 4.1 mmol/L (ref 3.5–5.2)
Sodium: 137 mmol/L (ref 134–144)
eGFR: 63 mL/min/{1.73_m2} (ref >59)

## 2023-01-18 LAB — LIPID PANEL
Chol/HDL Ratio: 3.1 ratio (ref 0.0–4.4)
Cholesterol, Total: 153 mg/dL (ref 100–199)
HDL: 49 mg/dL (ref >39)
LDL Chol Calc (NIH): 85 mg/dL (ref 0–99)
Triglycerides: 102 mg/dL (ref 0–149)
VLDL Cholesterol Cal: 19 mg/dL (ref 5–40)

## 2023-01-18 LAB — MICROALBUMIN / CREATININE URINE RATIO
Creatinine, Urine: 64.6 mg/dL
Microalb/Creat Ratio: <5 mg/g creat (ref 0–29)
Microalbumin, Urine: <3 ug/mL

## 2023-01-18 LAB — HEMOGLOBIN A1C
Est. average glucose Bld gHb Est-mCnc: 117 mg/dL
Hgb A1c MFr Bld: 5.7 % — ABNORMAL HIGH (ref 4.8–5.6)

## 2023-01-19 ENCOUNTER — Encounter: Payer: Self-pay | Admitting: Internal Medicine

## 2023-01-19 NOTE — Assessment & Plan Note (Signed)
On citalopram. Was on Trintilex. Felt was helping.  Not covered by insurance.  Attempted to get assistance.  Discussed referral to psychiatry to help with medication management.  Will let me know if desires referral.  Has tried prozac - not help.  Has been on wellbutrin previously.  Does not report adverse effect from wellbutrin.  Continues on citalopram.

## 2023-01-19 NOTE — Assessment & Plan Note (Signed)
Continue on torsemide and ziac.  Blood pressure as outlined.  Follow pressures.  Follow metabolic panel.  

## 2023-01-19 NOTE — Assessment & Plan Note (Signed)
S/p ablation.  Has seen cardiology.  Stable.  Follow.   

## 2023-01-19 NOTE — Assessment & Plan Note (Signed)
S/p left radical nephrectomy and partial adrenalectomy.  Seeing Dr Yu.  Stable.  Recommended f/u in one year.  

## 2023-01-19 NOTE — Assessment & Plan Note (Signed)
Follow cbc.  

## 2023-01-19 NOTE — Assessment & Plan Note (Signed)
Continue crestor 

## 2023-01-19 NOTE — Assessment & Plan Note (Signed)
MRI as previously outlined.  Aggravated by positioning - sleeping on side.  Notify me if desires further intervention.

## 2023-01-19 NOTE — Assessment & Plan Note (Signed)
Stable on ziac.  

## 2023-01-19 NOTE — Assessment & Plan Note (Signed)
Low carb diet and exercise.  Follow met b and a1c.   

## 2023-01-20 ENCOUNTER — Other Ambulatory Visit: Payer: Self-pay

## 2023-01-20 MED ORDER — TIZANIDINE HCL 4 MG PO TABS: 4.0000 mg | ORAL_TABLET | Freq: Every evening | ORAL | 0 refills | Status: DC | PRN

## 2023-02-04 DIAGNOSIS — H04123 Dry eye syndrome of bilateral lacrimal glands: Secondary | ICD-10-CM | POA: Diagnosis not present

## 2023-02-04 DIAGNOSIS — H25013 Cortical age-related cataract, bilateral: Secondary | ICD-10-CM | POA: Diagnosis not present

## 2023-02-04 DIAGNOSIS — H2513 Age-related nuclear cataract, bilateral: Secondary | ICD-10-CM | POA: Diagnosis not present

## 2023-02-12 DIAGNOSIS — N611 Abscess of the breast and nipple: Secondary | ICD-10-CM | POA: Diagnosis not present

## 2023-02-21 ENCOUNTER — Other Ambulatory Visit: Payer: Self-pay | Admitting: Internal Medicine

## 2023-03-11 DIAGNOSIS — Z0100 Encounter for examination of eyes and vision without abnormal findings: Secondary | ICD-10-CM | POA: Diagnosis not present

## 2023-03-14 DIAGNOSIS — L304 Erythema intertrigo: Secondary | ICD-10-CM | POA: Diagnosis not present

## 2023-03-14 DIAGNOSIS — D225 Melanocytic nevi of trunk: Secondary | ICD-10-CM | POA: Diagnosis not present

## 2023-03-14 DIAGNOSIS — D2261 Melanocytic nevi of right upper limb, including shoulder: Secondary | ICD-10-CM | POA: Diagnosis not present

## 2023-03-14 DIAGNOSIS — D2272 Melanocytic nevi of left lower limb, including hip: Secondary | ICD-10-CM | POA: Diagnosis not present

## 2023-03-14 DIAGNOSIS — L821 Other seborrheic keratosis: Secondary | ICD-10-CM | POA: Diagnosis not present

## 2023-03-14 DIAGNOSIS — D2271 Melanocytic nevi of right lower limb, including hip: Secondary | ICD-10-CM | POA: Diagnosis not present

## 2023-03-14 DIAGNOSIS — Z08 Encounter for follow-up examination after completed treatment for malignant neoplasm: Secondary | ICD-10-CM | POA: Diagnosis not present

## 2023-03-14 DIAGNOSIS — L720 Epidermal cyst: Secondary | ICD-10-CM | POA: Diagnosis not present

## 2023-03-14 DIAGNOSIS — D2262 Melanocytic nevi of left upper limb, including shoulder: Secondary | ICD-10-CM | POA: Diagnosis not present

## 2023-03-18 ENCOUNTER — Telehealth: Payer: Self-pay | Admitting: Gastroenterology

## 2023-03-18 NOTE — Telephone Encounter (Signed)
Patient called in to schedule a follow appointment for her medication refill.

## 2023-03-24 ENCOUNTER — Other Ambulatory Visit: Payer: Self-pay | Admitting: Internal Medicine

## 2023-03-26 ENCOUNTER — Telehealth: Payer: Self-pay

## 2023-03-26 NOTE — Telephone Encounter (Signed)
Ok to fill? Patient says she uses at night for her back.

## 2023-03-26 NOTE — Telephone Encounter (Signed)
Prescription Request  03/26/2023  LOV: Visit date not found  What is the name of the medication or equipment? tiZANidine (ZANAFLEX) 4 MG tablet  Have you contacted your pharmacy to request a refill? Yes   Which pharmacy would you like this sent to?  [facility] - [place], [place] - [address] [place] [address] Phone: [phone] Fax: [phone]    Patient notified that their request is being sent to the clinical staff for review and that they should receive a response within 2 business days.   Please advise at [phone]  Patient's pharmacy is calling to state they have faxed [place] a request for refill and patient is now out of this medication.

## 2023-03-27 NOTE — Telephone Encounter (Signed)
Medication has been refilled.

## 2023-04-02 ENCOUNTER — Encounter (INDEPENDENT_AMBULATORY_CARE_PROVIDER_SITE_OTHER): Payer: Self-pay

## 2023-04-03 ENCOUNTER — Ambulatory Visit: Payer: Medicare HMO | Admitting: Gastroenterology

## 2023-04-17 ENCOUNTER — Ambulatory Visit: Payer: Self-pay

## 2023-04-17 NOTE — Patient Outreach (Signed)
  Care Coordination   04/17/2023 Name: Jacqueline Nichols MRN: [id number] DOB: 07/15/1956   Care Coordination Outreach Attempts:  An unsuccessful telephone outreach was attempted today to offer the patient information about available care coordination services.  Follow Up Plan:  Additional outreach attempts will be made to offer the patient care coordination information and services.   Encounter Outcome:  No Answer   Care Coordination Interventions:  No, not indicated    SIG Lysle Morales, BSW Social Worker [facility] Care Management  [phone]

## 2023-04-18 ENCOUNTER — Ambulatory Visit (INDEPENDENT_AMBULATORY_CARE_PROVIDER_SITE_OTHER): Payer: Medicare HMO | Admitting: Internal Medicine

## 2023-04-18 VITALS — BP 128/74 | HR 60 | Temp 98.2°F | Resp 16 | Ht 70.0 in | Wt 223.2 lb

## 2023-04-18 DIAGNOSIS — E538 Deficiency of other specified B group vitamins: Secondary | ICD-10-CM | POA: Diagnosis not present

## 2023-04-18 DIAGNOSIS — I471 Supraventricular tachycardia, unspecified: Secondary | ICD-10-CM

## 2023-04-18 DIAGNOSIS — I10 Essential (primary) hypertension: Secondary | ICD-10-CM | POA: Diagnosis not present

## 2023-04-18 DIAGNOSIS — E1165 Type 2 diabetes mellitus with hyperglycemia: Secondary | ICD-10-CM

## 2023-04-18 DIAGNOSIS — E559 Vitamin D deficiency, unspecified: Secondary | ICD-10-CM | POA: Diagnosis not present

## 2023-04-18 DIAGNOSIS — E7800 Pure hypercholesterolemia, unspecified: Secondary | ICD-10-CM

## 2023-04-18 DIAGNOSIS — I700 Atherosclerosis of aorta: Secondary | ICD-10-CM

## 2023-04-18 DIAGNOSIS — Z85528 Personal history of other malignant neoplasm of kidney: Secondary | ICD-10-CM | POA: Diagnosis not present

## 2023-04-18 DIAGNOSIS — F320 Major depressive disorder, single episode, mild: Secondary | ICD-10-CM

## 2023-04-18 NOTE — Progress Notes (Unsigned)
Subjective:    Patient ID: Jacqueline Nichols, female    DOB: 09-16-1955, 67 y.o.   MRN: [id number]  Patient here for  Chief Complaint  Patient presents with   Medical Management of Chronic Issues    HPI Follow up regarding her increased stress, hypertension and diabetes. Reports increased stress.  Her fiances mother just diagnosed with lung cancer.  Discussed.  Also will have good day and then have days where she does not feel like going out or doing things.  Mood fluctuations.  Is taking citalopram.  Was on trintellix.  This helped, but was unable to afford.  Discussed psychiatry referral.  Declines at this time.  Will notify me if she changes her mind.  No chest pain or sob reported.  No abdominal pain or bowel change reported.     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   Urinary incontinence    Past Surgical History:  Procedure Laterality Date   CHOLECYSTECTOMY  2001   COLONOSCOPY WITH PROPOFOL N/A 03/27/2016   Procedure: COLONOSCOPY WITH PROPOFOL;  Surgeon: Midge Minium, MD;  Location: [facility] ENDOSCOPY;  Service: Endoscopy;  Laterality: N/A;   COLONOSCOPY WITH PROPOFOL N/A 08/31/2021   Procedure: COLONOSCOPY WITH PROPOFOL;  Surgeon: Midge Minium, MD;  Location: [facility];  Service: Endoscopy;  Laterality: N/A;   KNEE SURGERY  2003-2009   s/p 8 knee surgeries (with 3 knee replacements)   NEPHRECTOMY RADICAL  07/2015   NISSEN FUNDOPLICATION     Family History  Problem Relation Age of Onset   Colon cancer Mother        died - age 53   Heart disease Father    Hypertension Father    Lymphoma Father    Arthritis/Rheumatoid Sister    Lung cancer Brother        smoked   Arthritis/Rheumatoid Paternal Grandmother    Cancer Maternal Uncle         Unknown type of cancer    Cancer Paternal Aunt        Unknown type of cancer    Throat cancer Paternal Uncle    Colon cancer Paternal Uncle    Breast cancer Neg Hx    Social History   Socioeconomic History   Marital status: Divorced    Spouse name: Not on file   Number of children: 2   Years of education: Not on file   Highest education level: Not on file  Occupational History   Not on file  Tobacco Use   Smoking status: Former    Current packs/day: 0.00    Average packs/day: 1 pack/day for 15.0 years (15.0 ttl pk-yrs)    Types: Cigarettes    Start date: 10/23/1997    Quit date: 10/23/2012    Years since quitting: 10.4   Smokeless tobacco: Never   Tobacco comments:    Quit Feb 2014  Vaping Use   Vaping status: Former   Start date: 02/20/2013   Quit date: 02/20/2014  Substance and Sexual Activity   Alcohol use: Yes    Alcohol/week: 1.0 standard drink of alcohol    Types: 1 Glasses of wine per week  Comment: social drinking - approx 2-4 monthly    Drug use: No   Sexual activity: Not Currently  Other Topics Concern   Not on file  Social History Narrative   Not on file   Social Determinants of Health   Financial Resource Strain: Not on file  Food Insecurity: Not on file  Transportation Needs: Not on file  Physical Activity: Not on file  Stress: Not on file  Social Connections: Not on file     Review of Systems  Constitutional:  Negative for appetite change and unexpected weight change.  HENT:  Negative for congestion and sinus pressure.   Respiratory:  Negative for cough, chest tightness and shortness of breath.   Cardiovascular:  Negative for chest pain, palpitations and leg swelling.  Gastrointestinal:  Negative for abdominal pain, diarrhea, nausea and vomiting.  Genitourinary:  Negative for difficulty urinating and dysuria.  Musculoskeletal:  Negative for joint swelling and myalgias.  Skin:  Negative for color change and rash.  Neurological:  Negative  for dizziness and headaches.  Psychiatric/Behavioral:         Increased stress and depression as outlined.        Objective:     BP 128/74   Pulse 60   Temp 98.2 F (36.8 C)   Resp 16   Ht 5\' 10"  (1.778 m)   Wt 223 lb 3.2 oz (101.2 kg)   SpO2 97%   BMI 32.03 kg/m  Wt Readings from Last 3 Encounters:  04/18/23 223 lb 3.2 oz (101.2 kg)  01/17/23 224 lb (101.6 kg)  10/16/22 221 lb 9.6 oz (100.5 kg)    Physical Exam Vitals reviewed.  Constitutional:      General: She is not in acute distress.    Appearance: Normal appearance.  HENT:     Head: Normocephalic and atraumatic.     Right Ear: External ear normal.     Left Ear: External ear normal.  Eyes:     General: No scleral icterus.       Right eye: No discharge.        Left eye: No discharge.     Conjunctiva/sclera: Conjunctivae normal.  Neck:     Thyroid: No thyromegaly.  Cardiovascular:     Rate and Rhythm: Normal rate and regular rhythm.  Pulmonary:     Effort: No respiratory distress.     Breath sounds: Normal breath sounds. No wheezing.  Abdominal:     General: Bowel sounds are normal.     Palpations: Abdomen is soft.     Tenderness: There is no abdominal tenderness.  Musculoskeletal:        General: No swelling or tenderness.     Cervical back: Neck supple. No tenderness.  Lymphadenopathy:     Cervical: No cervical adenopathy.  Skin:    Findings: No erythema or rash.  Neurological:     Mental Status: She is alert.  Psychiatric:        Mood and Affect: Mood normal.        Behavior: Behavior normal.      Outpatient Encounter Medications as of 04/18/2023  Medication Sig   bisoprolol-hydrochlorothiazide (ZIAC) 2.5-6.25 MG tablet TAKE 1 TABLET BY MOUTH DAILY FOR HIGH BLOOD PRESSURE   citalopram (CELEXA) 40 MG tablet TAKE 1 TABLET BY MOUTH DAILY   clonazePAM (KLONOPIN) 0.5 MG tablet TAKE 1/2 TO 1 TABLET BY MOUTH EVERY DAY AS NEEDED   clopidogrel (PLAVIX) 75 MG tablet TAKE 1 TABLET BY MOUTH EVERY DAY  diphenoxylate-atropine (LOMOTIL) 2.5-0.025 MG tablet TAKE 1 TABLET BY MOUTH 4 TIMES DAILY AS NEEDED FOR DIARRHEA OR LOOSE STOOLS   gabapentin (NEURONTIN) 300 MG capsule Take 1 capsule (300 mg total) by mouth at bedtime.   Loperamide HCl (IMODIUM PO) Take by mouth as needed.   meloxicam (MOBIC) 7.5 MG tablet Take 1 tablet (7.5 mg total) by mouth 2 (two) times daily.   Misc Natural Products (ALLERGY RELEAF SYSTEM PO) Take by mouth. Reported on 03/20/2016   nystatin (MYCOSTATIN/NYSTOP) powder Apply 1 Application topically 2 (two) times daily.   nystatin cream (MYCOSTATIN) Apply 1 application topically 2 (two) times daily.   rosuvastatin (CRESTOR) 5 MG tablet Take 1 tablet (5 mg total) by mouth daily.   tiZANidine (ZANAFLEX) 4 MG tablet TAKE 1 TABLET BY MOUTH AT BEDTIME AS NEEDED FOR MUSCLE SPASMS   torsemide (DEMADEX) 20 MG tablet TAKE 1 TABLET BY MOUTH EVERY DAY AS NEEDED   No facility-administered encounter medications on file as of 04/18/2023.     Lab Results  Component Value Date   WBC 6.0 01/17/2023   HGB 13.4 01/17/2023   HCT 40.1 01/17/2023   PLT 186 01/17/2023   GLUCOSE 82 01/17/2023   CHOL 153 01/17/2023   TRIG 102 01/17/2023   HDL 49 01/17/2023   LDLCALC 85 01/17/2023   ALT 16 01/17/2023   AST 20 01/17/2023   NA 137 01/17/2023   K 4.1 01/17/2023   CL 99 01/17/2023   CREATININE 1.00 01/17/2023   BUN 18 01/17/2023   CO2 21 01/17/2023   TSH 1.55 10/16/2022   HGBA1C 5.7 (H) 01/17/2023    MM 3D SCREEN BREAST BILATERAL  Result Date: 08/19/2022 CLINICAL DATA:  Screening. EXAM: DIGITAL SCREENING BILATERAL MAMMOGRAM WITH TOMOSYNTHESIS AND CAD TECHNIQUE: Bilateral screening digital craniocaudal and mediolateral oblique mammograms were obtained. Bilateral screening digital breast tomosynthesis was performed. The images were evaluated with computer-aided detection. COMPARISON:  Previous exam(s). ACR Breast Density Category a: The breast tissue is almost entirely fatty. FINDINGS:  There are no findings suspicious for malignancy. IMPRESSION: No mammographic evidence of malignancy. A result letter of this screening mammogram will be mailed directly to the patient. RECOMMENDATION: Screening mammogram in one year. (Code:[id number]-B-01Y) BI-RADS CATEGORY  1: Negative. Electronically Signed   By: Harmon Pier M.D.   On: 08/19/2022 16:38       Assessment & Plan:  Aortic atherosclerosis (HCC) Assessment & Plan: Continue crestor.    Depression, major, single episode, mild (HCC) Assessment & Plan: On citalopram. Was on Trintilex. Felt was helping.  Not covered by insurance.  Attempted to get assistance.  Discussed referral to psychiatry to help with medication management.  Will let me know if desires referral.  Has tried prozac - not help.  Has been on wellbutrin previously.  Does not report adverse effect from wellbutrin.  Continues on citalopram.    Type 2 diabetes mellitus with hyperglycemia, without long-term current use of insulin (HCC) Assessment & Plan: Low carb diet and exercise.  Follow met b and a1c.  Orders: -     Hemoglobin A1c; Future -     Microalbumin / creatinine urine ratio; Future  Vitamin D deficiency Assessment & Plan: Continue vitamin D supplements.  Check vitamin D level.   Orders: -     VITAMIN D 25 Hydroxy (Vit-D Deficiency, Fractures); Future  SVT (supraventricular tachycardia) Assessment & Plan: Stable on ziac.    Personal history of renal cell carcinoma Assessment & Plan: Dr Cathie Hoops 09/26/22 - recommend f/u with  oncology in one year.    Primary hypertension Assessment & Plan: Continue on torsemide and ziac.  Blood pressure as outlined.  Follow pressures.  Follow metabolic panel.   Orders: -     Basic metabolic panel; Future  Hypercholesteremia Assessment & Plan: Continue crestor.   Orders: -     TSH; Future -     Lipid panel; Future -     Hepatic function panel; Future  B12 deficiency Assessment & Plan: On B12 supplements.  B12  level ok 10/2022.       Dale , MD

## 2023-04-19 ENCOUNTER — Encounter: Payer: Self-pay | Admitting: Internal Medicine

## 2023-04-19 NOTE — Assessment & Plan Note (Addendum)
On B12 supplements.  B12 level ok 10/2022.

## 2023-04-19 NOTE — Assessment & Plan Note (Signed)
Continue crestor 

## 2023-04-19 NOTE — Assessment & Plan Note (Signed)
Stable on ziac.  

## 2023-04-19 NOTE — Assessment & Plan Note (Signed)
Continue on torsemide and ziac.  Blood pressure as outlined.  Follow pressures.  Follow metabolic panel.

## 2023-04-19 NOTE — Assessment & Plan Note (Signed)
On citalopram. Was on Trintilex. Felt was helping.  Not covered by insurance.  Attempted to get assistance.  Discussed referral to psychiatry to help with medication management.  Will let me know if desires referral.  Has tried prozac - not help.  Has been on wellbutrin previously.  Does not report adverse effect from wellbutrin.  Continues on citalopram.

## 2023-04-19 NOTE — Assessment & Plan Note (Signed)
Low carb diet and exercise.  Follow met b and a1c.   

## 2023-04-19 NOTE — Assessment & Plan Note (Signed)
Continue vitamin D supplements.  Check vitamin D level.  

## 2023-04-19 NOTE — Assessment & Plan Note (Signed)
Dr Tasia Catchings 09/26/22 - recommend f/u with oncology in one year.

## 2023-04-22 ENCOUNTER — Ambulatory Visit: Payer: Self-pay

## 2023-04-22 NOTE — Patient Outreach (Signed)
  Care Coordination   Initial Visit Note   04/22/2023 Name: Jacqueline Nichols MRN: [id number] DOB: 06-Apr-1956  Jacqueline Nichols is a 67 y.o. year old female who sees Jacqueline [place], MD for primary care. I spoke with  Jacqueline Nichols by phone today.  What matters to the patients health and wellness today?  CM educated patient on [facility] services.  Patient declines services and feels that she is able to manage her medical needs.  Patient agreed to contact her provider in the future if she needs [facility] services.    Goals Addressed   None     SDOH assessments and interventions completed:  No     Care Coordination Interventions:  Yes, provided   Interventions Today    Flowsheet Row Most Recent Value  General Interventions   General Interventions Discussed/Reviewed General Interventions Discussed, General Interventions Reviewed, Community Resources  [Informed patient of possible extra assistance with [employer] and encouraged to call to see if she qualifies.  Patient does not need assistance with transportation, food or rx.]  Education Interventions   Education Provided Provided Education  [Educated on Case Management services]        Follow up plan: No further intervention required.   Encounter Outcome:  Pt. Visit Completed

## 2023-04-29 ENCOUNTER — Other Ambulatory Visit: Payer: Self-pay | Admitting: Internal Medicine

## 2023-04-30 NOTE — Telephone Encounter (Signed)
Rx ok'd for tizanidine and gabapentin.

## 2023-05-27 ENCOUNTER — Ambulatory Visit: Payer: Medicare HMO | Admitting: Gastroenterology

## 2023-05-27 ENCOUNTER — Encounter: Payer: Self-pay | Admitting: Gastroenterology

## 2023-05-27 VITALS — BP 136/84 | HR 54 | Temp 98.1°F | Wt 224.0 lb

## 2023-05-27 DIAGNOSIS — K580 Irritable bowel syndrome with diarrhea: Secondary | ICD-10-CM

## 2023-05-27 DIAGNOSIS — R160 Hepatomegaly, not elsewhere classified: Secondary | ICD-10-CM | POA: Diagnosis not present

## 2023-05-27 MED ORDER — DIPHENOXYLATE-ATROPINE 2.5-0.025 MG PO TABS: ORAL_TABLET | ORAL | 11 refills | Status: DC

## 2023-05-27 MED ORDER — DIPHENOXYLATE-ATROPINE 2.5-0.025 MG PO TABS: ORAL_TABLET | ORAL | 5 refills | Status: AC

## 2023-05-27 NOTE — Patient Instructions (Addendum)
Your ultrasound has been scheduled for Thursday, September 26th, at [facility] entrance.  You must arrive at 9:00 AM to register   You can not have anything to eat or drink after 12 midnight the day before.   You can call scheduling directly at [phone] to reschedule or cancel if needed.

## 2023-05-27 NOTE — Progress Notes (Signed)
Primary Care Physician: Dale Ellicott, MD  Primary Gastroenterologist:  Dr. Midge Minium  Chief Complaint  Patient presents with   Follow-up    HPI: Jacqueline Nichols is a 67 y.o. female here for follow-up of hepatomegaly.  The patient has been diagnosed in the past with hepatomegaly with normal liver enzymes.  The patient had a CT scan since the original diagnosis of hepatomegaly without any report of an enlarged liver.  The patient states that she has a family history of liver issues with her sister having fatty liver but this is the has abnormal liver enzymes.  The patient denies any unexplained weight loss fevers chills nausea vomiting black stools or bloody stools.  Past Medical History:  Diagnosis Date   Cancer (HCC)    skin   Chicken pox    Colon polyps    Depression    Diabetes mellitus (HCC)    GERD (gastroesophageal reflux disease)    History of kidney cancer    Hyperlipidemia    Hypertension    Stroke (HCC)    brainstem   Urinary incontinence     Current Outpatient Medications  Medication Sig Dispense Refill   bisoprolol-hydrochlorothiazide (ZIAC) 2.5-6.25 MG tablet TAKE 1 TABLET BY MOUTH DAILY FOR HIGH BLOOD PRESSURE 90 tablet 3   citalopram (CELEXA) 40 MG tablet TAKE 1 TABLET BY MOUTH DAILY 90 tablet 1   clonazePAM (KLONOPIN) 0.5 MG tablet TAKE 1/2 TO 1 TABLET BY MOUTH EVERY DAY AS NEEDED 30 tablet 0   clopidogrel (PLAVIX) 75 MG tablet TAKE 1 TABLET BY MOUTH EVERY DAY 90 tablet 3   diphenoxylate-atropine (LOMOTIL) 2.5-0.025 MG tablet TAKE 1 TABLET BY MOUTH 4 TIMES DAILY AS NEEDED FOR DIARRHEA OR LOOSE STOOLS 120 tablet 5   gabapentin (NEURONTIN) 300 MG capsule TAKE 1 CAPSULE BY MOUTH AT BEDTIME 30 capsule 2   Loperamide HCl (IMODIUM PO) Take by mouth as needed.     meloxicam (MOBIC) 7.5 MG tablet Take 1 tablet (7.5 mg total) by mouth 2 (two) times daily. 60 tablet 5   Misc Natural Products (ALLERGY RELEAF SYSTEM PO) Take by mouth. Reported on 03/20/2016      nystatin (MYCOSTATIN/NYSTOP) powder Apply 1 Application topically 2 (two) times daily. 60 g 0   nystatin cream (MYCOSTATIN) Apply 1 application topically 2 (two) times daily. 30 g 0   rosuvastatin (CRESTOR) 5 MG tablet Take 1 tablet (5 mg total) by mouth daily. 90 tablet 1   tiZANidine (ZANAFLEX) 4 MG tablet TAKE 1 TABLET BY MOUTH AT BEDTIME AS NEEDED FOR MUSCLE SPASMS 30 tablet 0   torsemide (DEMADEX) 20 MG tablet TAKE 1 TABLET BY MOUTH EVERY DAY AS NEEDED 90 tablet 1   No current facility-administered medications for this visit.    Allergies as of 05/27/2023 - Review Complete 05/27/2023  Allergen Reaction Noted   Codeine Nausea Only 10/21/2012   Vicodin [hydrocodone-acetaminophen] Itching 11/20/2012    ROS:  General: Negative for anorexia, weight loss, fever, chills, fatigue, weakness. ENT: Negative for hoarseness, difficulty swallowing , nasal congestion. CV: Negative for chest pain, angina, palpitations, dyspnea on exertion, peripheral edema.  Respiratory: Negative for dyspnea at rest, dyspnea on exertion, cough, sputum, wheezing.  GI: See history of present illness. GU:  Negative for dysuria, hematuria, urinary incontinence, urinary frequency, nocturnal urination.  Endo: Negative for unusual weight change.    Physical Examination:   BP 136/84 (BP Location: Left Arm, Patient Position: Sitting, Cuff Size: Large)   Pulse (!) 54  Temp 98.1 F (36.7 C) (Oral)   Wt 224 lb (101.6 kg)   BMI 32.14 kg/m   General: Well-nourished, well-developed in no acute distress.  Eyes: No icterus. Conjunctivae pink. Lungs: Clear to auscultation bilaterally. Non-labored. Heart: Regular rate and rhythm, no murmurs rubs or gallops.  Abdomen: Bowel sounds are normal, nontender, nondistended, no hepatosplenomegaly or masses, no abdominal bruits or hernia , no rebound or guarding.   Extremities: No lower extremity edema. No clubbing or deformities. Neuro: Alert and oriented x 3.  Grossly  intact. Skin: Warm and dry, no jaundice.   Psych: Alert and cooperative, normal mood and affect.  Labs:    Imaging Studies: No results found.  Assessment and Plan:   Jacqueline Nichols is a 67 y.o. y/o female who comes in today with a history of hepatomegaly.  The patient does not have a significantly enlarged liver on physical exam today.  The patient has a history of irritable bowel syndrome with diarrhea.  The patient has been counseled about fiber and things to help with her irritable bowel syndrome with diarrhea.  The patient will also be set up for an ultrasound to see if her hepatomegaly is still present.  The patient has been explained the plan and agrees with it.     Midge Minium, MD. Clementeen Graham    Note: This dictation was prepared with Dragon dictation along with smaller phrase technology. Any transcriptional errors that result from this process are unintentional.

## 2023-05-28 ENCOUNTER — Encounter: Payer: Self-pay | Admitting: Internal Medicine

## 2023-05-28 DIAGNOSIS — R160 Hepatomegaly, not elsewhere classified: Secondary | ICD-10-CM | POA: Insufficient documentation

## 2023-05-29 ENCOUNTER — Ambulatory Visit: Payer: Medicare HMO

## 2023-06-02 ENCOUNTER — Other Ambulatory Visit: Payer: Self-pay | Admitting: Internal Medicine

## 2023-06-02 ENCOUNTER — Ambulatory Visit: Payer: Medicare HMO

## 2023-06-03 NOTE — Telephone Encounter (Signed)
Rx ok'd for tizanidine.

## 2023-06-06 ENCOUNTER — Ambulatory Visit
Admission: RE | Admit: 2023-06-06 | Discharge: 2023-06-06 | Disposition: A | Discharge status: Home / Self Care | Payer: Medicare HMO | Source: Ambulatory Visit | Attending: Gastroenterology | Admitting: Gastroenterology

## 2023-06-06 DIAGNOSIS — Z905 Acquired absence of kidney: Secondary | ICD-10-CM | POA: Diagnosis not present

## 2023-06-06 DIAGNOSIS — R160 Hepatomegaly, not elsewhere classified: Secondary | ICD-10-CM | POA: Diagnosis not present

## 2023-06-06 DIAGNOSIS — R932 Abnormal findings on diagnostic imaging of liver and biliary tract: Secondary | ICD-10-CM | POA: Diagnosis not present

## 2023-06-06 DIAGNOSIS — Z9049 Acquired absence of other specified parts of digestive tract: Secondary | ICD-10-CM | POA: Diagnosis not present

## 2023-06-06 DIAGNOSIS — K769 Liver disease, unspecified: Secondary | ICD-10-CM | POA: Diagnosis not present

## 2023-06-12 ENCOUNTER — Other Ambulatory Visit: Payer: Self-pay

## 2023-06-12 ENCOUNTER — Encounter: Payer: Self-pay | Admitting: Internal Medicine

## 2023-06-12 ENCOUNTER — Emergency Department: Payer: Medicare HMO

## 2023-06-12 ENCOUNTER — Inpatient Hospital Stay
Admission: EM | Admit: 2023-06-12 | Discharge: 2023-06-14 | DRG: 062 | Disposition: A | Discharge status: Home / Self Care | Payer: Medicare HMO | Attending: Hospitalist | Admitting: Hospitalist

## 2023-06-12 DIAGNOSIS — R001 Bradycardia, unspecified: Secondary | ICD-10-CM | POA: Diagnosis present

## 2023-06-12 DIAGNOSIS — Z809 Family history of malignant neoplasm, unspecified: Secondary | ICD-10-CM | POA: Diagnosis not present

## 2023-06-12 DIAGNOSIS — E785 Hyperlipidemia, unspecified: Secondary | ICD-10-CM | POA: Diagnosis present

## 2023-06-12 DIAGNOSIS — G8194 Hemiplegia, unspecified affecting left nondominant side: Secondary | ICD-10-CM | POA: Diagnosis present

## 2023-06-12 DIAGNOSIS — R531 Weakness: Principal | ICD-10-CM

## 2023-06-12 DIAGNOSIS — I6381 Other cerebral infarction due to occlusion or stenosis of small artery: Secondary | ICD-10-CM | POA: Diagnosis not present

## 2023-06-12 DIAGNOSIS — Z8249 Family history of ischemic heart disease and other diseases of the circulatory system: Secondary | ICD-10-CM | POA: Diagnosis not present

## 2023-06-12 DIAGNOSIS — Z807 Family history of other malignant neoplasms of lymphoid, hematopoietic and related tissues: Secondary | ICD-10-CM

## 2023-06-12 DIAGNOSIS — Z801 Family history of malignant neoplasm of trachea, bronchus and lung: Secondary | ICD-10-CM

## 2023-06-12 DIAGNOSIS — Z905 Acquired absence of kidney: Secondary | ICD-10-CM

## 2023-06-12 DIAGNOSIS — R29818 Other symptoms and signs involving the nervous system: Secondary | ICD-10-CM | POA: Diagnosis not present

## 2023-06-12 DIAGNOSIS — E119 Type 2 diabetes mellitus without complications: Secondary | ICD-10-CM | POA: Diagnosis not present

## 2023-06-12 DIAGNOSIS — Z8261 Family history of arthritis: Secondary | ICD-10-CM

## 2023-06-12 DIAGNOSIS — I700 Atherosclerosis of aorta: Secondary | ICD-10-CM | POA: Diagnosis not present

## 2023-06-12 DIAGNOSIS — I69351 Hemiplegia and hemiparesis following cerebral infarction affecting right dominant side: Secondary | ICD-10-CM

## 2023-06-12 DIAGNOSIS — G8190 Hemiplegia, unspecified affecting unspecified side: Secondary | ICD-10-CM | POA: Diagnosis not present

## 2023-06-12 DIAGNOSIS — K219 Gastro-esophageal reflux disease without esophagitis: Secondary | ICD-10-CM | POA: Diagnosis present

## 2023-06-12 DIAGNOSIS — I10 Essential (primary) hypertension: Secondary | ICD-10-CM | POA: Diagnosis not present

## 2023-06-12 DIAGNOSIS — Z79899 Other long term (current) drug therapy: Secondary | ICD-10-CM

## 2023-06-12 DIAGNOSIS — F32A Depression, unspecified: Secondary | ICD-10-CM | POA: Diagnosis present

## 2023-06-12 DIAGNOSIS — Z800 Family history of malignant neoplasm of digestive organs: Secondary | ICD-10-CM | POA: Diagnosis not present

## 2023-06-12 DIAGNOSIS — I6782 Cerebral ischemia: Secondary | ICD-10-CM | POA: Diagnosis not present

## 2023-06-12 DIAGNOSIS — Z860100 Personal history of colon polyps, unspecified: Secondary | ICD-10-CM

## 2023-06-12 DIAGNOSIS — R930 Abnormal findings on diagnostic imaging of skull and head, not elsewhere classified: Secondary | ICD-10-CM | POA: Diagnosis not present

## 2023-06-12 DIAGNOSIS — Z7982 Long term (current) use of aspirin: Secondary | ICD-10-CM

## 2023-06-12 DIAGNOSIS — Z885 Allergy status to narcotic agent status: Secondary | ICD-10-CM

## 2023-06-12 DIAGNOSIS — Z87891 Personal history of nicotine dependence: Secondary | ICD-10-CM

## 2023-06-12 DIAGNOSIS — I639 Cerebral infarction, unspecified: Secondary | ICD-10-CM | POA: Diagnosis not present

## 2023-06-12 DIAGNOSIS — R29710 NIHSS score 10: Secondary | ICD-10-CM | POA: Diagnosis present

## 2023-06-12 DIAGNOSIS — E871 Hypo-osmolality and hyponatremia: Secondary | ICD-10-CM | POA: Diagnosis not present

## 2023-06-12 DIAGNOSIS — R42 Dizziness and giddiness: Secondary | ICD-10-CM | POA: Diagnosis not present

## 2023-06-12 DIAGNOSIS — D696 Thrombocytopenia, unspecified: Secondary | ICD-10-CM | POA: Diagnosis not present

## 2023-06-12 DIAGNOSIS — E041 Nontoxic single thyroid nodule: Secondary | ICD-10-CM | POA: Diagnosis not present

## 2023-06-12 DIAGNOSIS — Z808 Family history of malignant neoplasm of other organs or systems: Secondary | ICD-10-CM

## 2023-06-12 DIAGNOSIS — R0902 Hypoxemia: Secondary | ICD-10-CM | POA: Diagnosis not present

## 2023-06-12 DIAGNOSIS — Z85528 Personal history of other malignant neoplasm of kidney: Secondary | ICD-10-CM | POA: Diagnosis not present

## 2023-06-12 DIAGNOSIS — Z7902 Long term (current) use of antithrombotics/antiplatelets: Secondary | ICD-10-CM

## 2023-06-12 DIAGNOSIS — Z791 Long term (current) use of non-steroidal anti-inflammatories (NSAID): Secondary | ICD-10-CM

## 2023-06-12 LAB — CBC
HCT: 40 % (ref 36.0–46.0)
Hemoglobin: 13.4 g/dL (ref 12.0–15.0)
MCH: 29.5 pg (ref 26.0–34.0)
MCHC: 33.5 g/dL (ref 30.0–36.0)
MCV: 88.1 fL (ref 80.0–100.0)
Platelets: 147 10*3/uL — ABNORMAL LOW (ref 150–400)
RBC: 4.54 MIL/uL (ref 3.87–5.11)
RDW: 12.1 % (ref 11.5–15.5)
WBC: 5.9 10*3/uL (ref 4.0–10.5)
nRBC: 0 % (ref 0.0–0.2)

## 2023-06-12 LAB — URINALYSIS, ROUTINE W REFLEX MICROSCOPIC
Bacteria, UA: NONE SEEN
Bilirubin Urine: NEGATIVE
Glucose, UA: NEGATIVE mg/dL
Hgb urine dipstick: NEGATIVE
Ketones, ur: NEGATIVE mg/dL
Leukocytes,Ua: NEGATIVE
Nitrite: NEGATIVE
Protein, ur: NEGATIVE mg/dL
Specific Gravity, Urine: 1.014 (ref 1.005–1.030)
WBC, UA: 0 WBC/hpf (ref 0–5)
pH: 8 (ref 5.0–8.0)

## 2023-06-12 LAB — URINE DRUG SCREEN, QUALITATIVE (ARMC ONLY)
Amphetamines, Ur Screen: NOT DETECTED
Barbiturates, Ur Screen: NOT DETECTED
Benzodiazepine, Ur Scrn: NOT DETECTED
Cannabinoid 50 Ng, Ur ~~LOC~~: NOT DETECTED
Cocaine Metabolite,Ur ~~LOC~~: NOT DETECTED
MDMA (Ecstasy)Ur Screen: NOT DETECTED
Methadone Scn, Ur: NOT DETECTED
Opiate, Ur Screen: NOT DETECTED
Phencyclidine (PCP) Ur S: NOT DETECTED
Tricyclic, Ur Screen: NOT DETECTED

## 2023-06-12 LAB — DIFFERENTIAL
Abs Immature Granulocytes: 0.02 10*3/uL (ref 0.00–0.07)
Basophils Absolute: 0 10*3/uL (ref 0.0–0.1)
Basophils Relative: 0 %
Eosinophils Absolute: 0.1 10*3/uL (ref 0.0–0.5)
Eosinophils Relative: 2 %
Immature Granulocytes: 0 %
Lymphocytes Relative: 33 %
Lymphs Abs: 2 10*3/uL (ref 0.7–4.0)
Monocytes Absolute: 0.3 10*3/uL (ref 0.1–1.0)
Monocytes Relative: 6 %
Neutro Abs: 3.5 10*3/uL (ref 1.7–7.7)
Neutrophils Relative %: 59 %

## 2023-06-12 LAB — COMPREHENSIVE METABOLIC PANEL
ALT: 13 U/L (ref 0–44)
AST: 19 U/L (ref 15–41)
Albumin: 4 g/dL (ref 3.5–5.0)
Alkaline Phosphatase: 73 U/L (ref 38–126)
Anion gap: 8 (ref 5–15)
BUN: 15 mg/dL (ref 8–23)
CO2: 24 mmol/L (ref 22–32)
Calcium: 9 mg/dL (ref 8.9–10.3)
Chloride: 101 mmol/L (ref 98–111)
Creatinine, Ser: 0.84 mg/dL (ref 0.44–1.00)
GFR, Estimated: >60 mL/min (ref >60)
Glucose, Bld: 109 mg/dL — ABNORMAL HIGH (ref 70–99)
Potassium: 4 mmol/L (ref 3.5–5.1)
Sodium: 133 mmol/L — ABNORMAL LOW (ref 135–145)
Total Bilirubin: 0.7 mg/dL (ref 0.3–1.2)
Total Protein: 6.6 g/dL (ref 6.5–8.1)

## 2023-06-12 LAB — APTT: aPTT: 26 s (ref 24–36)

## 2023-06-12 LAB — PROTIME-INR
INR: 0.9 (ref 0.8–1.2)
Prothrombin Time: 12.7 s (ref 11.4–15.2)

## 2023-06-12 LAB — CBG MONITORING, ED: Glucose-Capillary: 99 mg/dL (ref 70–99)

## 2023-06-12 LAB — ETHANOL: Alcohol, Ethyl (B): <10 mg/dL (ref <10)

## 2023-06-12 MED ORDER — SENNOSIDES-DOCUSATE SODIUM 8.6-50 MG PO TABS: 1.0000 | ORAL_TABLET | Freq: Every evening | ORAL | Status: DC | PRN

## 2023-06-12 MED ORDER — DIPHENOXYLATE-ATROPINE 2.5-0.025 MG PO TABS: 1.0000 | ORAL_TABLET | Freq: Four times a day (QID) | ORAL | Status: DC | PRN

## 2023-06-12 MED ORDER — PANTOPRAZOLE SODIUM 40 MG IV SOLR
40.0000 mg | Freq: Every day | INTRAVENOUS | Status: DC
  Administered 2023-06-12: 40 mg via INTRAVENOUS
  Filled 2023-06-12: qty 10

## 2023-06-12 MED ORDER — ACETAMINOPHEN 650 MG RE SUPP: 650.0000 mg | RECTAL | Status: DC | PRN

## 2023-06-12 MED ORDER — LOPERAMIDE HCL 2 MG PO CAPS: 4.0000 mg | ORAL_CAPSULE | Freq: Two times a day (BID) | ORAL | Status: DC | PRN

## 2023-06-12 MED ORDER — ACETAMINOPHEN 160 MG/5ML PO SOLN: 650.0000 mg | ORAL | Status: DC | PRN

## 2023-06-12 MED ORDER — STROKE: EARLY STAGES OF RECOVERY BOOK: Freq: Once | Status: AC

## 2023-06-12 MED ORDER — LABETALOL HCL 5 MG/ML IV SOLN: 10.0000 mg | INTRAVENOUS | Status: DC | PRN

## 2023-06-12 MED ORDER — SODIUM CHLORIDE 0.9 % IV SOLN: INTRAVENOUS | Status: DC

## 2023-06-12 MED ORDER — IOHEXOL 350 MG/ML SOLN
75.0000 mL | Freq: Once | INTRAVENOUS | Status: AC | PRN
  Administered 2023-06-12: 75 mL via INTRAVENOUS

## 2023-06-12 MED ORDER — TENECTEPLASE FOR STROKE: 25.0000 mg | PACK | Freq: Once | INTRAVENOUS | Status: DC

## 2023-06-12 MED ORDER — ACETAMINOPHEN 325 MG PO TABS
650.0000 mg | ORAL_TABLET | ORAL | Status: DC | PRN
  Administered 2023-06-12: 650 mg via ORAL
  Filled 2023-06-12: qty 2

## 2023-06-12 MED ORDER — TENECTEPLASE FOR STROKE
0.2500 mg/kg | PACK | Freq: Once | INTRAVENOUS | Status: AC
  Administered 2023-06-12: 25 mg via INTRAVENOUS

## 2023-06-12 MED ORDER — BUTALBITAL-APAP-CAFFEINE 50-325-40 MG PO TABS
2.0000 | ORAL_TABLET | ORAL | Status: DC | PRN
  Administered 2023-06-12 – 2023-06-13 (×3): 2 via ORAL
  Filled 2023-06-12 (×3): qty 2

## 2023-06-12 NOTE — ED Triage Notes (Signed)
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

## 2023-06-12 NOTE — Progress Notes (Signed)
   06/12/23 1200  Spiritual Encounters  Type of Visit Initial  Care provided to: Family  Referral source Code page  Reason for visit Code (Stroke)  OnCall Visit Yes   Chaplain responded to code stroke. Patient currently cared for by the interdisciplinary team. Compassionate care provided to family. Chaplain spiritual support services remain available as the need arises.

## 2023-06-12 NOTE — Consult Note (Signed)
CODE STROKE- PHARMACY COMMUNICATION   Time CODE STROKE called/page received:1215  Time response to CODE STROKE was made (in person or via phone): Immediately, in person  Time Stroke Kit retrieved from [employer] (only if needed):1213 (overheard EDP say they were going to call the code stroke)  Name of Provider/Nurse contacted:Dr. Thad Ranger  Past Medical History:  Diagnosis Date   Cancer (HCC)    skin   Chicken pox    Colon polyps    Depression    Diabetes mellitus (HCC)    GERD (gastroesophageal reflux disease)    History of kidney cancer    Hyperlipidemia    Hypertension    Stroke (HCC)    brainstem   Urinary incontinence    Prior to Admission medications   Medication Sig Start Date End Date Taking? Authorizing Provider  bisoprolol-hydrochlorothiazide (ZIAC) 2.5-6.25 MG tablet TAKE 1 TABLET BY MOUTH DAILY FOR HIGH BLOOD PRESSURE 08/21/22   Dale [place], MD  citalopram (CELEXA) 40 MG tablet TAKE 1 TABLET BY MOUTH DAILY 12/17/22   Dale [place], MD  clonazePAM (KLONOPIN) 0.5 MG tablet TAKE 1/2 TO 1 TABLET BY MOUTH EVERY DAY AS NEEDED 07/23/22   Dale [place], MD  clopidogrel (PLAVIX) 75 MG tablet TAKE 1 TABLET BY MOUTH EVERY DAY 08/21/22   Dale [place], MD  diphenoxylate-atropine (LOMOTIL) 2.5-0.025 MG tablet TAKE 1 TABLET BY MOUTH 4 TIMES DAILY AS NEEDED FOR DIARRHEA OR LOOSE STOOLS 05/27/23   Midge Minium, MD  gabapentin (NEURONTIN) 300 MG capsule TAKE 1 CAPSULE BY MOUTH AT BEDTIME 04/30/23   Dale [place]ingle, MD  Loperamide HCl (IMODIUM PO) Take by mouth as needed.    [provider]  meloxicam (MOBIC) 7.5 MG tablet Take 1 tablet (7.5 mg total) by mouth 2 (two) times daily. 01/17/23   Dale [place], MD  Misc Natural Products (ALLERGY RELEAF SYSTEM PO) Take by mouth. Reported on 03/20/2016    [provider]  nystatin (MYCOSTATIN/NYSTOP) powder Apply 1 Application topically 2 (two) times daily. 10/17/22   Dale [place], MD  nystatin cream (MYCOSTATIN) Apply  1 application topically 2 (two) times daily. 12/20/20   Dale [place], MD  rosuvastatin (CRESTOR) 5 MG tablet Take 1 tablet (5 mg total) by mouth daily. 01/17/23   Dale Scraper, MD  tiZANidine (ZANAFLEX) 4 MG tablet TAKE 1 TABLET BY MOUTH AT BEDTIME AS NEEDED FOR MUSCLE SPASMS 06/03/23   Dale [place], MD  torsemide (DEMADEX) 20 MG tablet TAKE 1 TABLET BY MOUTH EVERY DAY AS NEEDED 10/18/21   Dale [place], MD    Will M. Dareen Piano, PharmD Clinical Pharmacist 06/12/2023 12:51 PM

## 2023-06-12 NOTE — Progress Notes (Signed)
PT Cancellation Note  Patient Details Name: Jacqueline Nichols MRN: [id number] DOB: 03-Oct-1955   Cancelled Treatment:    Reason Eval/Treat Not Completed: Other (comment): Orders Received. Chart Reviewed. Not medically at this time. Patient was admitted for CVA work up with TNK administration on 06/12/23 at 1245. Per guidelines, strict bedrest x24 hours post infusion and follow up imaging required to be completed prior to initiation of therapy.  Will continue to follow and initiate therapy services as medically appropriate.    Howie Ill, PT, DPT 06/12/23 2:38 PM

## 2023-06-12 NOTE — Progress Notes (Signed)
OT Cancellation Note  Patient Details Name: Jacqueline Nichols MRN: [id number] DOB: 11/18/1955   Cancelled Treatment:    Reason Eval/Treat Not Completed: Patient not medically ready. Consult received and chart reviewed.  Patient admitted for CVA work up with TNK administration on 06/12/23 at 1245. Per guidelines, to be on strict bedrest x24 hours post infusion and follow up imaging required to be completed prior to initiation of therapy.  Will continue to follow and initiate as medically appropriate.  Kathie Dike, M.S. OTR/L  06/12/23, 2:36 PM  ascom [phone]

## 2023-06-12 NOTE — Consult Note (Addendum)
NEUROLOGY CONSULT NOTE   Date of service: June 12, 2023 Patient Name: Jacqueline Nichols MRN:  [id number] DOB:  1956/01/21 Chief Complaint: "Left sided numbness and weakness" Requesting Provider: Sharyn Creamer, MD  History of Present Illness  Jacqueline Nichols is a 67 y.o. female  has a past medical history of Cancer (HCC), Chicken pox, Colon polyps, Depression, Diabetes mellitus (HCC), GERD (gastroesophageal reflux disease), History of kidney cancer, Hyperlipidemia, Hypertension, Stroke with residual right hemiparesis(brain stem 2001), and Urinary incontinence. who presents with acute onset left sided symptoms.  Patient was at home when she noted numbness on her left side that spread to include her entire left side.  She noted some weakness as well.  EMS was called and patient presented for evaluation  LKW: 06/12/2023 @ 1120 Modified rankin score: 1-No significant post stroke disability and can perform usual duties with stroke symptoms IV Thrombolysis: Yes EVT:  No; No target lesion identified   ROS  Comprehensive ROS performed and pertinent positives documented in HPI   Past History   Past Medical History:  Diagnosis Date   Cancer (HCC)    skin   Chicken pox    Colon polyps    Depression    Diabetes mellitus (HCC)    GERD (gastroesophageal reflux disease)    History of kidney cancer    Hyperlipidemia    Hypertension    Stroke [facility])    brainstem   Urinary incontinence     Past Surgical History:  Procedure Laterality Date   CHOLECYSTECTOMY  2001   COLONOSCOPY WITH PROPOFOL N/A 03/27/2016   Procedure: COLONOSCOPY WITH PROPOFOL;  Surgeon: Midge Minium, MD;  Location: [facility] ENDOSCOPY;  Service: Endoscopy;  Laterality: N/A;   COLONOSCOPY WITH PROPOFOL N/A 08/31/2021   Procedure: COLONOSCOPY WITH PROPOFOL;  Surgeon: Midge Minium, MD;  Location: [facility];  Service: Endoscopy;  Laterality: N/A;   KNEE SURGERY  2003-2009   s/p 8 knee surgeries (with 3 knee replacements)    NEPHRECTOMY RADICAL  07/2015   NISSEN FUNDOPLICATION      Family History: Family History  Problem Relation Age of Onset   Colon cancer Mother        died - age 53   Heart disease Father    Hypertension Father    Lymphoma Father    Arthritis/Rheumatoid Sister    Lung cancer Brother        smoked   Arthritis/Rheumatoid Paternal Grandmother    Cancer Maternal Uncle        Unknown type of cancer    Cancer Paternal Aunt        Unknown type of cancer    Throat cancer Paternal Uncle    Colon cancer Paternal Uncle    Breast cancer Neg Hx     Social History  reports that she quit smoking about 10 years ago. Her smoking use included cigarettes. She started smoking about 25 years ago. She has a 15 pack-year smoking history. She has never used smokeless tobacco. She reports current alcohol use of about 1.0 standard drink of alcohol per week. She reports that she does not use drugs.  Allergies  Allergen Reactions   Codeine Nausea Only   Vicodin [Hydrocodone-Acetaminophen] Itching    Medications   Current Facility-Administered Medications:    tenecteplase (TNKASE) injection for Stroke 25 mg, 25 mg, Intravenous, Once, Thana Farr, MD  Current Outpatient Medications:    bisoprolol-hydrochlorothiazide (ZIAC) 2.5-6.25 MG tablet, TAKE 1 TABLET BY MOUTH DAILY FOR HIGH BLOOD PRESSURE, Disp:  90 tablet, Rfl: 3   citalopram (CELEXA) 40 MG tablet, TAKE 1 TABLET BY MOUTH DAILY, Disp: 90 tablet, Rfl: 1   clonazePAM (KLONOPIN) 0.5 MG tablet, TAKE 1/2 TO 1 TABLET BY MOUTH EVERY DAY AS NEEDED, Disp: 30 tablet, Rfl: 0   clopidogrel (PLAVIX) 75 MG tablet, TAKE 1 TABLET BY MOUTH EVERY DAY, Disp: 90 tablet, Rfl: 3   diphenoxylate-atropine (LOMOTIL) 2.5-0.025 MG tablet, TAKE 1 TABLET BY MOUTH 4 TIMES DAILY AS NEEDED FOR DIARRHEA OR LOOSE STOOLS, Disp: 120 tablet, Rfl: 5   gabapentin (NEURONTIN) 300 MG capsule, TAKE 1 CAPSULE BY MOUTH AT BEDTIME, Disp: 30 capsule, Rfl: 2   Loperamide HCl (IMODIUM PO),  Take by mouth as needed., Disp: , Rfl:    meloxicam (MOBIC) 7.5 MG tablet, Take 1 tablet (7.5 mg total) by mouth 2 (two) times daily., Disp: 60 tablet, Rfl: 5   Misc Natural Products (ALLERGY RELEAF SYSTEM PO), Take by mouth. Reported on 03/20/2016, Disp: , Rfl:    nystatin (MYCOSTATIN/NYSTOP) powder, Apply 1 Application topically 2 (two) times daily., Disp: 60 g, Rfl: 0   nystatin cream (MYCOSTATIN), Apply 1 application topically 2 (two) times daily., Disp: 30 g, Rfl: 0   rosuvastatin (CRESTOR) 5 MG tablet, Take 1 tablet (5 mg total) by mouth daily., Disp: 90 tablet, Rfl: 1   tiZANidine (ZANAFLEX) 4 MG tablet, TAKE 1 TABLET BY MOUTH AT BEDTIME AS NEEDED FOR MUSCLE SPASMS, Disp: 30 tablet, Rfl: 0   torsemide (DEMADEX) 20 MG tablet, TAKE 1 TABLET BY MOUTH EVERY DAY AS NEEDED, Disp: 90 tablet, Rfl: 1  Vitals   Vitals:   06/12/23 1200  Weight: 102.1 kg    Body mass index is 32.28 kg/m.  Physical Exam   Constitutional: Appears well-developed and well-nourished.  Psych: Affect appropriate to situation.  Eyes: No scleral injection.  Head: Normocephalic.  Cardiovascular: Normal rate and regular rhythm.  Respiratory: Effort normal, non-labored breathing.    Neurologic Examination   NIHSS components Score: Comment  1a Level of Conscious 0[x]  1[]  2[]  3[]      1b LOC Questions 0[x]  1[]  2[]       1c LOC Commands 0[x]  1[]  2[]       2 Best Gaze 0[x]  1[]  2[]       3 Visual 0[x]  1[]  2[]  3[]      4 Facial Palsy 0[]  1[x]  2[]  3[]      5a Motor Arm - left 0[]  1[]  2[]  3[x]  4[]  UN[]    5b Motor Arm - Right 0[]  1[x]  2[]  3[]  4[]  UN[]    6a Motor Leg - Left 0[]  1[]  2[x]  3[]  4[]  UN[]    6b Motor Leg - Right 0[]  1[x]  2[]  3[]  4[]  UN[]    7 Limb Ataxia 0[x]  1[]  2[]  3[]  UN[]     8 Sensory 0[]  1[x]  2[]  UN[]      9 Best Language 0[x]  1[]  2[]  3[]      10 Dysarthria 0[]  1[x]  2[]  UN[]      11 Extinct. and Inattention 0[x]  1[]  2[]       TOTAL: 10      Labs   CBC: No results for input(s): "WBC", "NEUTROABS", "HGB",  "HCT", "MCV", "PLT" in the last 168 hours.  Basic Metabolic Panel:  Lab Results  Component Value Date   NA 137 01/17/2023   K 4.1 01/17/2023   CO2 21 01/17/2023   GLUCOSE 82 01/17/2023   BUN 18 01/17/2023   CREATININE 1.00 01/17/2023   CALCIUM 9.7 01/17/2023   GFRNONAA >60 09/26/2022   GFRAA 76 07/07/2020   Lipid  Panel:  Lab Results  Component Value Date   LDLCALC 85 01/17/2023   HgbA1c:  Lab Results  Component Value Date   HGBA1C 5.7 (H) 01/17/2023   Urine Drug Screen: No results found for: "LABOPIA", "COCAINSCRNUR", "LABBENZ", "AMPHETMU", "THCU", "LABBARB"  Alcohol Level No results found for: "ETH" INR No results found for: "INR" APTT No results found for: "APTT" AED levels: No results found for: "PHENYTOIN", "ZONISAMIDE", "LAMOTRIGINE", "LEVETIRACETA"   CT Head without contrast(Personally reviewed): CT HEAD WITHOUT CONTRAST   TECHNIQUE: Contiguous axial images were obtained from the base of the skull through the vertex without intravenous contrast.   RADIATION DOSE REDUCTION: This exam was performed according to the departmental dose-optimization program which includes automated exposure control, adjustment of the mA and/or kV according to patient size and/or use of iterative reconstruction technique.   COMPARISON:  CT head 04/11/2019.   FINDINGS: Brain: Prior perforator infarct in the left basal ganglia. No evidence of acute large vascular territory infarct, acute hemorrhage, mass lesion or midline shift.   Vascular: No hyperdense vessel identified.   Skull: No acute fracture.   Sinuses/Orbits: Clear sinuses.  No acute orbital findings.   Other: No mastoid effusions.   ASPECTS [facility] Stroke Program Early CT Score) total score (0-10 with 10 being normal): 10.   Dr. Thad Ranger paged at the time of dictation for call of report.   IMPRESSION: 1. No evidence of acute intracranial abnormality. 2. ASPECTS is 10.    CT angio Head and Neck with  contrast(Personally reviewed): CT ANGIOGRAPHY HEAD AND NECK WITH AND WITHOUT CONTRAST   TECHNIQUE: Multidetector CT imaging of the head and neck was performed using the standard protocol during bolus administration of intravenous contrast. Multiplanar CT image reconstructions and MIPs were obtained to evaluate the vascular anatomy. Carotid stenosis measurements (when applicable) are obtained utilizing NASCET criteria, using the distal internal carotid diameter as the denominator.   RADIATION DOSE REDUCTION: This exam was performed according to the departmental dose-optimization program which includes automated exposure control, adjustment of the mA and/or kV according to patient size and/or use of iterative reconstruction technique.   CONTRAST:  75mL OMNIPAQUE IOHEXOL 350 MG/ML SOLN   COMPARISON:  CT head from today.   FINDINGS: CTA NECK FINDINGS   Aortic arch: Great vessel origins are patent. Aortic atherosclerosis.   Right carotid system: No evidence of dissection, stenosis (50% or greater), or occlusion.   Left carotid system: No evidence of dissection, stenosis (50% or greater), or occlusion.   Vertebral arteries: Right dominant. No evidence of dissection, stenosis (50% or greater), or occlusion.   Skeleton: No obvious acute abnormality on limited assessment. Multilevel facet and uncovertebral hypertrophy.   Other neck: Approximately 1.7 cm left thyroid nodule.   Upper chest: Visualized lung apices are clear.   Review of the MIP images confirms the above findings   CTA HEAD FINDINGS   Anterior circulation: Right A1 ACAs hypoplastic. Otherwise, Bilateral intracranial ICAs, MCAs, and ACAs are patent without proximal hemodynamically significant stenosis.   Posterior circulation: Non dominant left vertebral artery terminates as PICA, anatomic variant.   Venous sinuses: As permitted by contrast timing, patent.   Anatomic variants: Detailed above.   Review of  the MIP images confirms the above findings   IMPRESSION: 1. No large vessel occlusion or proximal high-grade stenosis. 2. Approximately 1.7 cm left thyroid nodule. Recommend thyroid ultrasound (ref: J Am Coll Radiol. 2015 Feb;12(2): 143-50). 3.  Aortic Atherosclerosis (ICD10-I70.0).   Impression   Jacqueline Nichols is  a 67 y.o. female with a past medical history of Cancer (HCC), Chicken pox, Colon polyps, Depression, Diabetes mellitus (HCC), GERD (gastroesophageal reflux disease), History of kidney cancer, Hyperlipidemia, Hypertension, Stroke with residual right hemiparesis(brain stem 2001), and Urinary incontinence. who presents with acute onset left sided symptoms.  Head CT personally reviewed.  No evidence of hemorrhage noted.  Risks and benefits of thrombolytic therapy explained to patient.  Patient initially hesitant providing consent.  Approached family who provided verbal consent and patient then agreed.  Contraindications reviewed.  TNK administered at 1244. CTA of the head and neck reviewed and shows no evidence of LVO  Recommendations  1. HgbA1c, fasting lipid panel.  Target A1c<7.0, target LDL<70. 2. MRI brain without contrast 3. PT consult, OT consult, Speech consult 4. Echocardiogram with bubble study 5. No antiplatelet or anticoagulation therapy until 24-hour imaging performed and hemorrhage ruled out 6. BP control<180/105   7. Telemetry monitoring 8. Frequent neuro checks as per post thrombolytic order set 9. Repeat CT imaging without contrast to be performed 24 hours after thrombolytic administration, unless MRI is performed at that time. 10. If acute worsening noted patient to have stat head CT without contrast and neurology to be contacted ______________________________________________________________________  This patient is critically ill and at significant risk of neurological worsening, death and care requires constant monitoring of vital signs, hemodynamics,respiratory  and cardiac monitoring, neurological assessment, discussion with family, other specialists and medical decision making of high complexity. I spent 60 minutes of neurocritical care time  in the care of  this patient.  Thana Farr, MD Neurology  06/12/2023  1:40 PM

## 2023-06-12 NOTE — Progress Notes (Signed)
eLink Physician-Brief Progress Note Patient Name: Jacqueline Nichols DOB: 1956/03/30 MRN: [id number]   Date of Service  06/12/2023  HPI/Events of Note  67 year old female with a history of cancer, depression, diabetes, and previous stroke with residual right-sided hemiparesis who presents with left-sided symptoms and underwent tenecteplase therapy for suspected acute stroke.  On presentation, she is bradycardic and mildly hypertensive into the 160s.  Saturating 97% on room air.  Results show mild hyponatremia.  Mild thrombocytopenia to 147.  Otherwise normal blood counts and electrolytes.  Negative UDS.  CT head with no acute intracranial abnormalities and no LVO.  eICU Interventions  Consider switching IV fluids for enteral nutrition  SBP goal less than 180 systolic, 105 diastolic.  Added as needed labetalol  Home PPI, GI prophylaxis DVT prophylaxis held in the setting of TNK.  Maintain SCDs     Intervention Category Evaluation Type: New Patient Evaluation  [name] 06/12/2023, 9:25 PM

## 2023-06-12 NOTE — ED Provider Notes (Signed)
[facility] Provider Note    Event Date/Time   First MD Initiated Contact with Patient 06/12/23 1215     (approximate)   History   Weakness left side acute  Code stroke activated prehospital  EM caveat acuity  HPI  Jacqueline Nichols is a 67 y.o. female       11:30 AM reports suddenly feeling numb and weak on the left side.  EMS noted slight facial droop and left-sided weakness.  Fast ED score of 3  Patient reports history of mild right-sided weakness due to a previous stroke.  Suddenly started feeling unwell, lightheaded, numb and weak over her left arm and left leg and face at 11:30 AM today.  Otherwise in her normal state of health  Primary care note also denotes a history of hypertension and diabetes.  Is also notation of a "brainstem stroke"  Patient has past medical history of hepatomegaly, renal mass, renal cell carcinoma  Current medications include Plavix.  No anticoagulant noted on patient's recent medication list for which the patient does follow-up with [facility] physician  Physical Exam   Triage Vital Signs: ED Triage Vitals  Encounter Vitals Group     BP      Systolic BP Percentile      Diastolic BP Percentile      Pulse      Resp      Temp      Temp src      SpO2      Weight      Height      Head Circumference      Peak Flow      Pain Score      Pain Loc      Pain Education      Exclude from Growth Chart     Most recent vital signs: Vitals:   06/12/23 1230  BP: (!) 162/91     General: Awake, no distress.  Sitting up without difficulty and on EMS stretcher CV:  Good peripheral perfusion.  Resp:  Normal effort.  Abd:  No distention.  Other:  No obvious facial weakness.  Extract movements normal.  She does report slight diminishment in sensation over the left side of the face left arm and left leg.  She demonstrates 3 out of 5 gravity isolated to the strength in the left upper extremity.     ED Results /  Procedures / Treatments   Labs (all labs ordered are listed, but only abnormal results are displayed) Labs Reviewed  CBC - Abnormal; Notable for the following components:      Result Value   Platelets 147 (*)    All other components within normal limits  COMPREHENSIVE METABOLIC PANEL - Abnormal; Notable for the following components:   Sodium 133 (*)    Glucose, Bld 109 (*)    All other components within normal limits  ETHANOL  PROTIME-INR  APTT  DIFFERENTIAL  URINE DRUG SCREEN, QUALITATIVE ([facility] ONLY)  URINALYSIS, ROUTINE W REFLEX MICROSCOPIC  HIV ANTIBODY (ROUTINE TESTING W REFLEX)  CBG MONITORING, ED     EKG  And interpreted by me at 1310 heart rate 50 QRS 90 QTc 440 Normal sinus rhythm no evidence of acute ischemia   RADIOLOGY CT HEAD CODE STROKE WO CONTRAST  Result Date: 06/12/2023 CLINICAL DATA:  Code stroke.  Neuro deficit, acute, stroke suspected EXAM: CT HEAD WITHOUT CONTRAST TECHNIQUE: Contiguous axial images were obtained from the base of the skull through the  vertex without intravenous contrast. RADIATION DOSE REDUCTION: This exam was performed according to the departmental dose-optimization program which includes automated exposure control, adjustment of the mA and/or kV according to patient size and/or use of iterative reconstruction technique. COMPARISON:  CT head 04/11/2019. FINDINGS: Brain: Prior perforator infarct in the left basal ganglia. No evidence of acute large vascular territory infarct, acute hemorrhage, mass lesion or midline shift. Vascular: No hyperdense vessel identified. Skull: No acute fracture. Sinuses/Orbits: Clear sinuses.  No acute orbital findings. Other: No mastoid effusions. ASPECTS [facility] Stroke Program Early CT Score) total score (0-10 with 10 being normal): 10. Dr. Thad Ranger paged at the time of dictation for call of report. IMPRESSION: 1. No evidence of acute intracranial abnormality. 2. ASPECTS is 10. Electronically Signed   By: Feliberto Harts M.D.   On: 06/12/2023 12:33       PROCEDURES:  Critical Care performed: Yes, see critical care procedure note(s)  CRITICAL CARE Performed by: Sharyn Creamer   Total critical care time: 40 minutes  Critical care time was exclusive of separately billable procedures and treating other patients.  Critical care was necessary to treat or prevent imminent or life-threatening deterioration.  Critical care was time spent personally by me on the following activities: development of treatment plan with patient and/or surrogate as well as nursing, discussions with consultants, evaluation of patient's response to treatment, examination of patient, obtaining history from patient or surrogate, ordering and performing treatments and interventions, ordering and review of laboratory studies, ordering and review of radiographic studies, pulse oximetry and re-evaluation of patient's condition.   Procedures   MEDICATIONS ORDERED IN ED: Medications  tenecteplase (TNKASE) injection for Stroke 25 mg (25 mg Intravenous Not Given 06/12/23 1247)   stroke: early stages of recovery book (has no administration in time range)  0.9 %  sodium chloride infusion (has no administration in time range)  acetaminophen (TYLENOL) tablet 650 mg (has no administration in time range)    Or  acetaminophen (TYLENOL) 160 MG/5ML solution 650 mg (has no administration in time range)    Or  acetaminophen (TYLENOL) suppository 650 mg (has no administration in time range)  senna-docusate (Senokot-S) tablet 1 tablet (has no administration in time range)  pantoprazole (PROTONIX) injection 40 mg (has no administration in time range)  tenecteplase (TNKASE) injection for Stroke 0.25 mg/kg (25 mg Intravenous Given 06/12/23 1244)  iohexol (OMNIPAQUE) 350 MG/ML injection 75 mL (75 mLs Intravenous Contrast Given 06/12/23 1252)     IMPRESSION / MDM / ASSESSMENT AND PLAN / ED COURSE  I reviewed the triage vital signs and the  nursing notes.                              Differential diagnosis includes, but is not limited to, acute stroke, intracranial hemorrhage, tumor, psychogenic, etc.  The patient does however have acute onset left-sided paresthesia with left-sided weakness consistent with a high concern for acute stroke.  Blood pressure less than 185 systolic prior to receiving TNK.    Patient's presentation is most consistent with acute presentation with potential threat to life or bodily function.   The patient is on the cardiac monitor to evaluate for evidence of arrhythmia and/or significant heart rate changes.    Clinical Course as of 06/12/23 1357  Thu Jun 12, 2023  1236 CT of the head grossly interpreted by me as negative for hemorrhage.  Await final read by radiologist  [MQ]  Clinical Course User Index [MQ] Sharyn Creamer, MD   Thrombolytic decision made by Dr. Thad Ranger.  I was present in CT, and the patient did have risks benefits of thrombolytic discussed with her by Dr. Thad Ranger, and after having discussed with her family as well, the patient agreeable with receiving TNK/thrombolytic   ----------------------------------------- 1:56 PM on 06/12/2023 ----------------------------------------- Patient admitted to now under the care of the ICU service  Normal CBC no acute finding on metabolic panel.  FINAL CLINICAL IMPRESSION(S) / ED DIAGNOSES   Final diagnoses:  Acute left-sided weakness     Rx / DC Orders   ED Discharge Orders     None        Note:  This document was prepared using Dragon voice recognition software and may include unintentional dictation errors.   Sharyn Creamer, MD 06/12/23 1357

## 2023-06-12 NOTE — Code Documentation (Addendum)
Stroke Response Nurse Documentation Code Documentation  Jacqueline Nichols is a 67 y.o. female arriving to [facility] via [facility] on 06/12/2023 with past medical hx of DM, HLD, HLD, HTN, former smoker, prev stroke with residual right sided weakness. On clopidogrel 75 mg daily. Code stroke was activated by EMS.   Patient from home where she was LKW at 1120 and now complaining of  sudden onset left sided weakness and decreased sensation. LVO score 3 per EMS  Stroke team at the bedside on patient arrival. Labs drawn and patient cleared for CT by Dr. Fanny Bien. Patient to CT with team. NIHSS 10, see documentation for details and code stroke times. Patient with right facial droop, bilateral arm weakness, bilateral leg weakness, left decreased sensation, and dysarthria  on exam. The following imaging was completed:  CT Head and CTA. Patient is a candidate for IV Thrombolytic. TNK given at 1244 in CT. Delay obtaining IV access and patient/decision family making. Patient is not a candidate for IR due to no LVO on imaging, per MD.   Care Plan: post-TNK assessments per order: q15 min NIHSS+VS until 1444, q61min NIHSS+VS until 2044, then q1h x16 hours (until 1244). NPO until swallow screen passed.   Bedside handoff with ED RN Konrad Felix.    Wille Glaser  Stroke Response RN

## 2023-06-12 NOTE — H&P (Signed)
NAME:  Jacqueline Nichols, MRN:  [id number], DOB:  13-Dec-1955, LOS: 0 ADMISSION DATE:  06/12/2023  CHIEF COMPLAINT:  acute left sided weakness  BRIEF SYNOPSIS  History of Present Illness:  67 y.o. female  has a past medical history of Cancer (HCC), Chicken pox, Colon polyps, Depression, Diabetes mellitus (HCC), GERD (gastroesophageal reflux disease), History of kidney cancer, Hyperlipidemia, Hypertension Previous Stroke with residual right hemiparesis(brain stem 2001), and Urinary incontinence.  Presents with acute onset left sided symptoms.  Patient was at home when she noted numbness on her left side that spread to include her entire left side.  She noted some weakness as well.  EMS was called and patient presented for evaluation   LKW: 06/12/2023 @ 1120 IV Thrombolysis: Yes @1244PM     Significant Hospital Events: Including procedures, antibiotic start and stop dates in addition to other pertinent events   10/10 admitted for acute CVA s/p TNKASE     Interim History / Subjective:  Patient states that symptoms are resolving VS stable Has headache which is baseline    Examination: General: Adult female,lying in bed, NAD HEENT: MM pink/moist, anicteric, atraumatic, neck supple Neuro: A&O x 3, able to follow commands, PERRL +3, MAE CN: II - Visual field intact OU III, IV, VI - Extraocular movements intact. V - Facial sensation intact bilaterally. VII - Facial movement intact bilaterally VIII - Hearing & vestibular intact bilaterally X - Palate elevates symmetrically XI - Chin turning & shoulder shrug intact bilaterally. XII - Tongue protrusion intact Motor Strength - strength 4/5 in RUE, 5/5 in other extremities and pronator drift was absent.  Sensory - Light touch was assessed and was symmetrical Coordination - The patient had normal movements in the hands and feet with no ataxia or dysmetria.  Tremor was absent Gait and Station - deferred.  NIHSS Level of   Consciousness: 0 = Alert, keenly responsive; 1 = Not alert, but arousable by minor stimulation to obey, answer, or respond; 2 = Not alert, requires repeated stimulation to attend, or is obtunded and requires strong or painful stimulation to make movements (not stereotyped); 3 = Responds only with reflex motor or autonomic effects or totally unresponsive, flaccid, and areflexic   0  LOC Questions 0 = Answers both questions correctly; 1 = Answers one question correctly; 2 = Answers neither question correctly.  0  LOC Commands 0 = Performs both tasks correctly; 1 = Performs one task correctly; 2 = Performs neither task correctly  0  Best Gaze  0 = Normal; 1 = Partial gaze palsy; gaze is abnormal in one or both eyes, but forced deviation or total gaze paresis is not present; 2 = Forced deviation, or total gaze paresis not overcome by the oculocephalic maneuver   0  Visual 0 = No visual loss; 1 = Partial hemianopia; 2 = Complete hemianopia; 3 = Bilateral hemianopia (blind including cortical blindness).   0  Facial Palsy 0 = Normal symmetrical movements; 1 = Minor paralysis (flattened nasolabial fold, asymmetry on smiling); 2 = Partial paralysis (total or near-total paralysis of lower face); 3 = Complete paralysis of one or both sides (absence of facial movement in the upper and lower face).  0  Motor Arm LEFT 0 = No drift; limb holds 90 (or 45) degrees for full 10 secs; 1 = Drift; limb holds 90 (or 45) degrees, but drifts down before full 10 seconds; does not hit bed or other support; 2 = Some effort against gravity; limb cannot get  to or maintain (if cued) 90 (or 45) degrees, drifts down to bed, but has some effort against gravity; 3 = No effort against gravity; limb falls; 4 = No movement; UN = unable to test (Amputation or joint fusion).   1  Motor Arm RIGHT 0 = No drift; limb holds 90 (or 45) degrees for full 10 secs; 1 = Drift; limb holds 90 (or 45) degrees, but drifts down before full 10 seconds; does not  hit bed or other support; 2 = Some effort against gravity; limb cannot get to or maintain (if cued) 90 (or 45) degrees, drifts down to bed, but has some effort against gravity; 3 = No effort against gravity; limb falls; 4 = No movement; UN = unable to test (Amputation or joint fusion).   0  Motor Leg LEFT 0 = No drift; leg holds 30 degree position for full 5 secs; 1 = Drift; leg falls by the end of the 5-sec period but does not hit bed; 2 = Some effort against gravity; leg falls to bed by 5 secs, but has some effort against gravity; 3 = No effort against gravity; leg falls to bed immediately; 4 = No movement; UN = unable to test (Amputation or joint fusion).   2  Motor Leg RIGHT 0 = No drift; leg holds 30 degree position for full 5 secs; 1 = Drift; leg falls by the end of the 5-sec period but does not hit bed; 2 = Some effort against gravity; leg falls to bed by 5 secs, but has some effort against gravity; 3 = No effort against gravity; leg falls to bed immediately; 4 = No movement; UN = unable to test (Amputation or joint fusion).   0  Limb Ataxia 0 = Absent; 1 = Present in one limb; 2 = Present in two limbs; UN = Amputation or joint fusion   0  Sensory  0 = Normal, no sensory loss; 1 = Mild-to-moderate sensory loss, patient feels pinprick is less sharp or is dull on the affected side, or there is a loss of superficial pain with pinprick, but patient is aware of being touched; 2 = Severe to total sensory loss, patient is not aware of being touched in the face, arm, and leg.   0  Best Language 0 = No aphasia; normal; 1 = Mild-to-moderate aphasia; some obvious loss of fluency or facility of comprehension, w/o significant limitation on ideas expressed or form of expression. Reduction of speech and/or comprehension, however, makes conversation about provided materials difficult or impossible. For example, in conversation about provided materials, examiner can identify picture or naming card content from patient's  response; 2 = Severe aphasia; all communication is through fragmentary expression; great need for inference, questioning, and guessing by the listener. Range of information that can be exchanged is limited; listener carries burden of communication. Examiner cannot identify materials provided from patient response; 3 = Mute, global aphasia; no usable speech or auditory comprehension  0  Dysarthria 0 = Normal; 1 = Mild-to-moderate dysarthria, patient slurs at least some words and, at worst, can be understood with some difficulty; 2 = Severe dysarthria, patient's speech is so slurred as to be unintelligible in the absence of or out of proportion to any dysphasia, or is mute/anarthric; UN = Intubated or other physical barrier  0  Extinction/Inattention 0 = No abnormality 1 = Visual, tactile, auditory, spatial, or personal extinction/inattention to bilateral simultaneous stimulation in one of the sensory modalities. 2 = Profound hemi-inattention or  extinction to more than one modality; does not recognize own hand or orients to only one side of space.  0  Total   3       Objective   Blood pressure (!) 128/108, pulse (!) 47, temperature 98 F (36.7 C), temperature source Oral, resp. rate 11, height 5\' 10"  (1.778 m), weight 101.3 kg, SpO2 97%.       No intake or output data in the 24 hours ending 06/12/23 1522 Filed Weights   06/12/23 1200 06/12/23 1328  Weight: 102.1 kg 101.3 kg      Labs/imaging that I havepersonally reviewed  (right click and "Reselect all SmartList Selections" daily)      ASSESSMENT AND PLAN SYNOPSIS  67 yo white female with acute CVA with left sided numbness and weakness initial NIH score was 10 s/p TNKase NIH scale now 3   CARDIAC ICU monitoring  RENAL -continue Foley Catheter-assess need -Avoid nephrotoxic agents -Follow urine output, BMP -Ensure adequate renal perfusion, optimize oxygenation -Renal dose medications    Latest Ref Rng & Units 06/12/2023    12:36 PM 01/17/2023    2:28 PM 09/26/2022    1:36 PM  BMP  Glucose 70 - 99 mg/dL 409  82  96   BUN 8 - 23 mg/dL 15  18  16    Creatinine 0.44 - 1.00 mg/dL 8.11  9.14  7.82   BUN/Creat Ratio 12 - 28  18    Sodium 135 - 145 mmol/L 133  137  136   Potassium 3.5 - 5.1 mmol/L 4.0  4.1  4.0   Chloride 98 - 111 mmol/L 101  99  102   CO2 22 - 32 mmol/L 24  21  23    Calcium 8.9 - 10.3 mg/dL 9.0  9.7  9.1        NEUROLOGY ACUTE  CVA -HgbA1c, fasting lipid panel.  Target A1c<7.0, target LDL<70. -MRI brain without contrast - PT consult, OT consult, Speech consult -Echocardiogram with bubble study - No antiplatelet or anticoagulation therapy until 24-hour imaging performed and hemorrhage ruled out - BP control<180/105   -Frequent neuro checks as per post thrombolytic order set -Repeat CT imaging without contrast to be performed 24 hours after thrombolytic administration, unless MRI is performed at that time. -If acute worsening noted patient to have stat head CT without contrast and neurology to be contacted   ENDO - ICU hypoglycemic\Hyperglycemia protocol -check FSBS per protocol   GI GI PROPHYLAXIS as indicated  NUTRITIONAL STATUS DIET-->as tolerated Constipation protocol as indicated   ELECTROLYTES -follow labs as needed -replace as needed -pharmacy consultation and following   ACUTE ANEMIA- TRANSFUSE AS NEEDED CONSIDER TRANSFUSION  IF HGB<7      Best practice (right click and "Reselect all SmartList Selections" daily)  Mobility:  bed rest  Code Status:  FULL Disposition:ICU  Labs   CBC: Recent Labs  Lab 06/12/23 1236  WBC 5.9  NEUTROABS 3.5  HGB 13.4  HCT 40.0  MCV 88.1  PLT 147*    Basic Metabolic Panel: Recent Labs  Lab 06/12/23 1236  NA 133*  K 4.0  CL 101  CO2 24  GLUCOSE 109*  BUN 15  CREATININE 0.84  CALCIUM 9.0   GFR: Estimated Creatinine Clearance: 86 mL/min (by C-G formula based on SCr of 0.84 mg/dL). Recent Labs  Lab  06/12/23 1236  WBC 5.9    Liver Function Tests: Recent Labs  Lab 06/12/23 1236  AST 19  ALT 13  ALKPHOS 73  BILITOT 0.7  PROT 6.6  ALBUMIN 4.0   No results for input(s): "LIPASE", "AMYLASE" in the last 168 hours. No results for input(s): "AMMONIA" in the last 168 hours.  ABG No results found for: "PHART", "PCO2ART", "PO2ART", "HCO3", "TCO2", "ACIDBASEDEF", "O2SAT"   Coagulation Profile: Recent Labs  Lab 06/12/23 1236  INR 0.9    Cardiac Enzymes: No results for input(s): "CKTOTAL", "CKMB", "CKMBINDEX", "TROPONINI" in the last 168 hours.  HbA1C: Hemoglobin A1C  Date/Time Value Ref Range Status  04/10/2017 12:00 AM 5.60  Final   Hgb A1c MFr Bld  Date/Time Value Ref Range Status  01/17/2023 02:28 PM 5.7 (H) 4.8 - 5.6 % Final    Comment:             Prediabetes: 5.7 - 6.4          Diabetes: >6.4          Glycemic control for adults with diabetes: <7.0   10/16/2022 12:46 PM 5.5 4.6 - 6.5 % Final    Comment:    Glycemic Control Guidelines for People with Diabetes:Non Diabetic:  <6%Goal of Therapy: <7%Additional Action Suggested:  >8%     CBG: Recent Labs  Lab 06/12/23 1218  GLUCAP 99     Past Medical History:  She,  has a past medical history of Cancer (HCC), Chicken pox, Colon polyps, Depression, Diabetes mellitus (HCC), GERD (gastroesophageal reflux disease), History of kidney cancer, Hyperlipidemia, Hypertension, Stroke (HCC), and Urinary incontinence.   Surgical History:   Past Surgical History:  Procedure Laterality Date   CHOLECYSTECTOMY  2001   COLONOSCOPY WITH PROPOFOL N/A 03/27/2016   Procedure: COLONOSCOPY WITH PROPOFOL;  Surgeon: Midge Minium, MD;  Location: [facility] ENDOSCOPY;  Service: Endoscopy;  Laterality: N/A;   COLONOSCOPY WITH PROPOFOL N/A 08/31/2021   Procedure: COLONOSCOPY WITH PROPOFOL;  Surgeon: Midge Minium, MD;  Location: [facility];  Service: Endoscopy;  Laterality: N/A;   KNEE SURGERY  2003-2009   s/p 8 knee surgeries (with  3 knee replacements)   NEPHRECTOMY RADICAL  07/2015   NISSEN FUNDOPLICATION       Social History:   reports that she quit smoking about 10 years ago. Her smoking use included cigarettes. She started smoking about 25 years ago. She has a 15 pack-year smoking history. She has never used smokeless tobacco. She reports current alcohol use of about 1.0 standard drink of alcohol per week. She reports that she does not use drugs.   Family History:  Her family history includes Arthritis/Rheumatoid in her paternal grandmother and sister; Cancer in her maternal uncle and paternal aunt; Colon cancer in her mother and paternal uncle; Heart disease in her father; Hypertension in her father; Lung cancer in her brother; Lymphoma in her father; Throat cancer in her paternal uncle. There is no history of Breast cancer.   Allergies Allergies  Allergen Reactions   Codeine Nausea Only   Vicodin [Hydrocodone-Acetaminophen] Itching     Home Medications  Prior to Admission medications   Medication Sig Start Date End Date Taking? Authorizing Provider  bisoprolol-hydrochlorothiazide (ZIAC) 2.5-6.25 MG tablet TAKE 1 TABLET BY MOUTH DAILY FOR HIGH BLOOD PRESSURE 08/21/22  Yes Dale [place], MD  cholecalciferol (VITAMIN D3) 25 MCG (1000 UNIT) tablet Take 1,000 Units by mouth daily.   Yes [provider]  citalopram (CELEXA) 40 MG tablet TAKE 1 TABLET BY MOUTH DAILY 12/17/22  Yes Dale [place], MD  clonazePAM (KLONOPIN) 0.5 MG tablet TAKE 1/2 TO 1 TABLET BY MOUTH EVERY DAY AS NEEDED  07/23/22  Yes Dale [place], MD  clopidogrel (PLAVIX) 75 MG tablet TAKE 1 TABLET BY MOUTH EVERY DAY 08/21/22  Yes Dale [place], MD  diphenoxylate-atropine (LOMOTIL) 2.5-0.025 MG tablet TAKE 1 TABLET BY MOUTH 4 TIMES DAILY AS NEEDED FOR DIARRHEA OR LOOSE STOOLS 05/27/23  Yes Midge Minium, MD  gabapentin (NEURONTIN) 300 MG capsule TAKE 1 CAPSULE BY MOUTH AT BEDTIME 04/30/23  Yes Dale [place], MD  Loperamide HCl (IMODIUM  PO) Take by mouth as needed.   Yes [provider]  meloxicam (MOBIC) 7.5 MG tablet Take 1 tablet (7.5 mg total) by mouth 2 (two) times daily. 01/17/23  Yes Dale [place], MD  Misc Natural Products (ALLERGY RELEAF SYSTEM PO) Take by mouth. Reported on 03/20/2016   Yes [provider]  nystatin (MYCOSTATIN/NYSTOP) powder Apply 1 Application topically 2 (two) times daily. 10/17/22  Yes Dale [place], MD  nystatin cream (MYCOSTATIN) Apply 1 application topically 2 (two) times daily. 12/20/20  Yes Dale [place], MD  rosuvastatin (CRESTOR) 5 MG tablet Take 1 tablet (5 mg total) by mouth daily. 01/17/23  Yes Dale [place], MD  tiZANidine (ZANAFLEX) 4 MG tablet TAKE 1 TABLET BY MOUTH AT BEDTIME AS NEEDED FOR MUSCLE SPASMS 06/03/23  Yes Dale [place], MD  torsemide (DEMADEX) 20 MG tablet TAKE 1 TABLET BY MOUTH EVERY DAY AS NEEDED 10/18/21  Yes Dale [place], MD  vitamin B-12 (CYANOCOBALAMIN) 250 MCG tablet Take 250 mcg by mouth daily.   Yes [provider]         Critical Care Time devoted to patient care services described in this note is 75 minutes.   Patient is critically ill.   Lucie Leather, M.D.  [facility]  Medical Director [facility] Medical Director [facility] Cardio-Pulmonary Department

## 2023-06-12 NOTE — Progress Notes (Signed)
1216-Code stroke activated, pt in CT. LKWT 1120, left sided weakness and left facial numbness.  1218-DrThad Ranger at bedside in CT  1230-Dr. Thad Ranger discussing TNK risks/benefits with patient.  1244-TNK ordered and given IV push by bedside RN

## 2023-06-13 ENCOUNTER — Inpatient Hospital Stay: Payer: Medicare HMO

## 2023-06-13 ENCOUNTER — Inpatient Hospital Stay
Admit: 2023-06-13 | Discharge: 2023-06-13 | Disposition: A | Discharge status: Home / Self Care | Payer: Medicare HMO | Attending: Neurology | Admitting: Neurology

## 2023-06-13 DIAGNOSIS — I639 Cerebral infarction, unspecified: Secondary | ICD-10-CM | POA: Diagnosis not present

## 2023-06-13 LAB — RENAL FUNCTION PANEL
Albumin: 3.6 g/dL (ref 3.5–5.0)
Anion gap: 9 (ref 5–15)
BUN: 14 mg/dL (ref 8–23)
CO2: 27 mmol/L (ref 22–32)
Calcium: 8.9 mg/dL (ref 8.9–10.3)
Chloride: 102 mmol/L (ref 98–111)
Creatinine, Ser: 1.02 mg/dL — ABNORMAL HIGH (ref 0.44–1.00)
GFR, Estimated: >60 mL/min (ref >60)
Glucose, Bld: 95 mg/dL (ref 70–99)
Phosphorus: 4.1 mg/dL (ref 2.5–4.6)
Potassium: 3.9 mmol/L (ref 3.5–5.1)
Sodium: 138 mmol/L (ref 135–145)

## 2023-06-13 LAB — MAGNESIUM: Magnesium: 2.2 mg/dL (ref 1.7–2.4)

## 2023-06-13 LAB — CBC
HCT: 36.8 % (ref 36.0–46.0)
Hemoglobin: 12.5 g/dL (ref 12.0–15.0)
MCH: 30 pg (ref 26.0–34.0)
MCHC: 34 g/dL (ref 30.0–36.0)
MCV: 88.5 fL (ref 80.0–100.0)
Platelets: 134 10*3/uL — ABNORMAL LOW (ref 150–400)
RBC: 4.16 MIL/uL (ref 3.87–5.11)
RDW: 12.2 % (ref 11.5–15.5)
WBC: 4.6 10*3/uL (ref 4.0–10.5)
nRBC: 0 % (ref 0.0–0.2)

## 2023-06-13 LAB — ECHOCARDIOGRAM COMPLETE
AR max vel: 2.43 cm2
AV Area VTI: 2.45 cm2
AV Area mean vel: 2.54 cm2
AV Mean grad: 4 mm[Hg]
AV Peak grad: 8.5 mm[Hg]
Ao pk vel: 1.46 m/s
Area-P 1/2: 2.83 cm2
Height: 70 in
MV VTI: 2.05 cm2
S' Lateral: 2.2 cm
Weight: 3574.4 [oz_av]

## 2023-06-13 LAB — LIPID PANEL
Cholesterol: 119 mg/dL (ref 0–200)
HDL: 41 mg/dL (ref >40)
LDL Cholesterol: 56 mg/dL (ref 0–99)
Total CHOL/HDL Ratio: 2.9 {ratio}
Triglycerides: 109 mg/dL (ref <150)
VLDL: 22 mg/dL (ref 0–40)

## 2023-06-13 LAB — HEMOGLOBIN A1C
Hgb A1c MFr Bld: 5.6 % (ref 4.8–5.6)
Mean Plasma Glucose: 114.02 mg/dL

## 2023-06-13 LAB — MRSA NEXT GEN BY PCR, NASAL: MRSA by PCR Next Gen: NOT DETECTED

## 2023-06-13 LAB — HIV ANTIBODY (ROUTINE TESTING W REFLEX): HIV Screen 4th Generation wRfx: NONREACTIVE

## 2023-06-13 LAB — GLUCOSE, CAPILLARY: Glucose-Capillary: 87 mg/dL (ref 70–99)

## 2023-06-13 MED ORDER — TRAMADOL HCL 50 MG PO TABS
50.0000 mg | ORAL_TABLET | Freq: Two times a day (BID) | ORAL | Status: DC | PRN
  Administered 2023-06-13: 50 mg via ORAL
  Filled 2023-06-13: qty 1

## 2023-06-13 MED ORDER — CLOPIDOGREL BISULFATE 75 MG PO TABS
75.0000 mg | ORAL_TABLET | Freq: Every day | ORAL | Status: DC
  Administered 2023-06-13: 75 mg via ORAL
  Filled 2023-06-13: qty 1

## 2023-06-13 MED ORDER — ATORVASTATIN CALCIUM 20 MG PO TABS
40.0000 mg | ORAL_TABLET | Freq: Every day | ORAL | Status: DC
  Administered 2023-06-13 – 2023-06-14 (×2): 40 mg via ORAL
  Filled 2023-06-13 (×2): qty 2

## 2023-06-13 MED ORDER — ACETAMINOPHEN 160 MG/5ML PO SOLN: 650.0000 mg | ORAL | Status: DC | PRN

## 2023-06-13 MED ORDER — GABAPENTIN 300 MG PO CAPS
300.0000 mg | ORAL_CAPSULE | Freq: Every day | ORAL | Status: DC
  Administered 2023-06-13: 300 mg via ORAL
  Filled 2023-06-13: qty 1

## 2023-06-13 MED ORDER — CHLORHEXIDINE GLUCONATE CLOTH 2 % EX PADS
6.0000 | MEDICATED_PAD | Freq: Every day | CUTANEOUS | Status: DC
  Administered 2023-06-13 – 2023-06-14 (×2): 6 via TOPICAL

## 2023-06-13 MED ORDER — ACETAMINOPHEN 650 MG RE SUPP: 650.0000 mg | RECTAL | Status: DC | PRN

## 2023-06-13 MED ORDER — ASPIRIN 81 MG PO TBEC
81.0000 mg | DELAYED_RELEASE_TABLET | Freq: Every day | ORAL | Status: DC
  Administered 2023-06-13: 81 mg via ORAL
  Filled 2023-06-13: qty 1

## 2023-06-13 MED ORDER — CITALOPRAM HYDROBROMIDE 20 MG PO TABS
40.0000 mg | ORAL_TABLET | Freq: Every day | ORAL | Status: DC
  Administered 2023-06-13 – 2023-06-14 (×2): 40 mg via ORAL
  Filled 2023-06-13 (×2): qty 2

## 2023-06-13 MED ORDER — ENOXAPARIN SODIUM 60 MG/0.6ML IJ SOSY: 0.5000 mg/kg | PREFILLED_SYRINGE | INTRAMUSCULAR | Status: DC

## 2023-06-13 MED ORDER — ACETAMINOPHEN 325 MG PO TABS: 650.0000 mg | ORAL_TABLET | ORAL | Status: DC | PRN

## 2023-06-13 MED ORDER — VITAMIN D 25 MCG (1000 UNIT) PO TABS
1000.0000 [IU] | ORAL_TABLET | Freq: Every day | ORAL | Status: DC
  Administered 2023-06-13 – 2023-06-14 (×2): 1000 [IU] via ORAL
  Filled 2023-06-13 (×2): qty 1

## 2023-06-13 MED ORDER — KETOROLAC TROMETHAMINE 15 MG/ML IJ SOLN
15.0000 mg | Freq: Once | INTRAMUSCULAR | Status: AC
  Administered 2023-06-13: 15 mg via INTRAVENOUS
  Filled 2023-06-13: qty 1

## 2023-06-13 NOTE — Progress Notes (Signed)
NEUROLOGY CONSULT FOLLOW UP NOTE   Date of service: June 13, 2023 Patient Name: Jacqueline Nichols MRN:  [id number] DOB:  1956-05-26  Brief HPI  Jacqueline Nichols is a 67 y.o. female  has a past medical history of Cancer (HCC), Chicken pox, Colon polyps, Depression, Diabetes mellitus (HCC), GERD (gastroesophageal reflux disease), History of kidney cancer, Hyperlipidemia, Hypertension, Stroke with residual right hemiparesis(brain stem 2001), and Urinary incontinence. who presents with acute onset left sided numbness and weakness.   Interval Hx/subjective   Symptoms resolved.  Patient reports being back to baseline.   Vitals   Vitals:   06/13/23 0900 06/13/23 0930 06/13/23 1000 06/13/23 1030  BP: (!) 113/91 123/79 (!) 100/54 116/81  Pulse: (!) 46 (!) 34 (!) 50 (!) 47  Resp: 14 14 14 12   Temp:      TempSrc:      SpO2: 97% 92% 97% 95%  Weight:      Height:         Body mass index is 32.05 kg/m.  Physical Exam   Constitutional: Appears well-developed and well-nourished.  Psych: Affect appropriate to situation.  Eyes: No scleral injection.  Head: Normocephalic.  Cardiovascular: Normal rate and regular rhythm.  Respiratory: Effort normal, non-labored breathing.    Neurologic Examination   NIHSS components Score: Comment  1a Level of Conscious 0[x]  1[]  2[]  3[]      1b LOC Questions 0[x]  1[]  2[]       1c LOC Commands 0[x]  1[]  2[]       2 Best Gaze 0[x]  1[]  2[]       3 Visual 0[x]  1[]  2[]  3[]      4 Facial Palsy 0[x]  1[]  2[]  3[]      5a Motor Arm - left 0[x]  1[]  2[]  3[]  4[]  UN[]    5b Motor Arm - Right 0[]  1[x]  2[]  3[]  4[]  UN[]    6a Motor Leg - Left 0[x]  1[]  2[]  3[]  4[]  UN[]    6b Motor Leg - Right 0[]  1[x]  2[]  3[]  4[]  UN[]    7 Limb Ataxia 0[x]  1[]  2[]  3[]  UN[]     8 Sensory 0[x]  1[]  2[]  UN[]      9 Best Language 0[x]  1[]  2[]  3[]      10 Dysarthria 0[x]  1[]  2[]  UN[]      11 Extinct. and Inattention 0[x]  1[]  2[]       TOTAL: 2      Labs and Diagnostic Imaging   CBC:  Recent Labs   Lab 06/12/23 1236 06/13/23 0327  WBC 5.9 4.6  NEUTROABS 3.5  --   HGB 13.4 12.5  HCT 40.0 36.8  MCV 88.1 88.5  PLT 147* 134*    Basic Metabolic Panel:  Lab Results  Component Value Date   NA 138 06/13/2023   K 3.9 06/13/2023   CO2 27 06/13/2023   GLUCOSE 95 06/13/2023   BUN 14 06/13/2023   CREATININE 1.02 (H) 06/13/2023   CALCIUM 8.9 06/13/2023   GFRNONAA >60 06/13/2023   GFRAA 76 07/07/2020   Lipid Panel:  Lab Results  Component Value Date   LDLCALC 56 06/13/2023   HgbA1c:  Lab Results  Component Value Date   HGBA1C 5.6 06/13/2023   Urine Drug Screen:     Component Value Date/Time   LABOPIA NONE DETECTED 06/12/2023 1522   COCAINSCRNUR NONE DETECTED 06/12/2023 1522   LABBENZ NONE DETECTED 06/12/2023 1522   AMPHETMU NONE DETECTED 06/12/2023 1522   THCU NONE DETECTED 06/12/2023 1522   LABBARB NONE DETECTED 06/12/2023 1522    Alcohol Level  Component Value Date/Time   ETH <10 06/12/2023 1236   INR  Lab Results  Component Value Date   INR 0.9 06/12/2023   APTT  Lab Results  Component Value Date   APTT 26 06/12/2023   AED levels: No results found for: "PHENYTOIN", "ZONISAMIDE", "LAMOTRIGINE", "LEVETIRACETA"   Impression   Jacqueline Nichols is a 67 y.o. female with a past medical history of Cancer (HCC), Chicken pox, Colon polyps, Depression, Diabetes mellitus (HCC), GERD (gastroesophageal reflux disease), History of kidney cancer, Hyperlipidemia, Hypertension, Stroke with residual right hemiparesis(brain stem 2001), and Urinary incontinence.who presents with acute onset left sided numbness and weakness. Received TNK in the ED.  No complications noted.  Reports being back to baseline today.  LDL 56 on Crestor.  A1c 5.6.  Echocardiogram results pending  Recommendations  MRI of the brain and echo pending Would continue statin If repeat imaging at 24 hours shows no evidence of hemorrhage would start ASA 81mg  and Plavix 75 mg with ASA to be discontinued  after 3 weeks Patient to follow up with neurology on an outpatient basis   Resistance testing can be performed at that time ______________________________________________________________________   Thank you for the opportunity to take part in the care of this patient. If you have any further questions, please contact the neurology consultation team on call. Updated oncall schedule is listed on AMION.  Signed,  Kynsie Falkner

## 2023-06-13 NOTE — Progress Notes (Addendum)
[facility]  - [place] [facility]   PATIENT NAME: Jacqueline Nichols    MR#:  [id number]  DATE OF BIRTH:  [birth year]  SUBJECTIVE:  complains of headache left-sided numbness improving. No new near neural deficit. Tolerating PO diet. Cleared from speech. No family at bedside.    VITALS:  Blood pressure 134/70, pulse (!) 51, temperature 97.9 F (36.6 C), resp. rate 13, height 5\' 10"  (1.778 m), weight 101.3 kg, SpO2 95%.  PHYSICAL EXAMINATION:   GENERAL:  67 y.o.-year-old patient with no acute distress.  LUNGS: Normal breath sounds bilaterally, no wheezing CARDIOVASCULAR: S1, S2 normal. No murmur   ABDOMEN: Soft, nontender, nondistended. Bowel sounds present.  EXTREMITIES: No  edema b/l.    NEUROLOGIC: nonfocal  patient is alert and awake SKIN: No obvious rash, lesion, or ulcer.   LABORATORY PANEL:  CBC Recent Labs  Lab 06/13/23 0327  WBC 4.6  HGB 12.5  HCT 36.8  PLT 134*    Chemistries  Recent Labs  Lab 06/12/23 1236 06/13/23 0327  NA 133* 138  K 4.0 3.9  CL 101 102  CO2 24 27  GLUCOSE 109* 95  BUN 15 14  CREATININE 0.84 1.02*  CALCIUM 9.0 8.9  MG  --  2.2  AST 19  --   ALT 13  --   ALKPHOS 73  --   BILITOT 0.7  --    Cardiac Enzymes No results for input(s): "TROPONINI" in the last 168 hours. RADIOLOGY:  ECHOCARDIOGRAM COMPLETE  Result Date: 06/13/2023    ECHOCARDIOGRAM REPORT   Patient Name:   Jacqueline Nichols Date of Exam: 06/13/2023 Medical Rec #:  [id number]       Height:       70.0 in Accession #:    [id number]      Weight:       223.4 lb Date of Birth:  Sep 01, 1956        BSA:          2.188 m Patient Age:    67 years        BP:           130/75 mmHg Patient Gender: F               HR:           43 bpm. Exam Location:  [facility] Procedure: 2D Echo, Cardiac Doppler and Color Doppler Indications:     Stroke  History:         Patient has no prior history of Echocardiogram examinations.                  Stroke, Arrythmias:Tachycardia and Bradycardia,                   Signs/Symptoms:Fatigue and Syncope; Risk Factors:Hypertension                  and Diabetes.  Sonographer:     Mikki Harbor Referring Phys:  [id number] LESLIE REYNOLDS Diagnosing Phys: Debbe Odea MD IMPRESSIONS  1. Left ventricular ejection fraction, by estimation, is 65 to 70%. The left ventricle has normal function. The left ventricle has no regional wall motion abnormalities. Left ventricular diastolic parameters were normal.  2. Right ventricular systolic function is normal. The right ventricular size is normal. There is normal pulmonary artery systolic pressure.  3. Left atrial size was mildly dilated.  4. The mitral valve is normal in structure. Mild mitral valve regurgitation.  5. The aortic valve  is tricuspid. Aortic valve regurgitation is not visualized.  6. The inferior vena cava is normal in size with greater than 50% respiratory variability, suggesting right atrial pressure of 3 mmHg. FINDINGS  Left Ventricle: Left ventricular ejection fraction, by estimation, is 65 to 70%. The left ventricle has normal function. The left ventricle has no regional wall motion abnormalities. The left ventricular internal cavity size was normal in size. There is  no left ventricular hypertrophy. Left ventricular diastolic parameters were normal. Right Ventricle: The right ventricular size is normal. No increase in right ventricular wall thickness. Right ventricular systolic function is normal. There is normal pulmonary artery systolic pressure. The tricuspid regurgitant velocity is 2.59 m/s, and  with an assumed right atrial pressure of 3 mmHg, the estimated right ventricular systolic pressure is 29.8 mmHg. Left Atrium: Left atrial size was mildly dilated. Right Atrium: Right atrial size was normal in size. Pericardium: There is no evidence of pericardial effusion. Mitral Valve: The mitral valve is normal in structure. Mild mitral valve regurgitation. MV peak gradient, 5.5 mmHg. The mean mitral valve  gradient is 1.0 mmHg. Tricuspid Valve: The tricuspid valve is normal in structure. Tricuspid valve regurgitation is mild. Aortic Valve: The aortic valve is tricuspid. Aortic valve regurgitation is not visualized. Aortic valve mean gradient measures 4.0 mmHg. Aortic valve peak gradient measures 8.5 mmHg. Aortic valve area, by VTI measures 2.45 cm. Pulmonic Valve: The pulmonic valve was normal in structure. Pulmonic valve regurgitation is not visualized. Aorta: The aortic root is normal in size and structure. Venous: The inferior vena cava is normal in size with greater than 50% respiratory variability, suggesting right atrial pressure of 3 mmHg. IAS/Shunts: No atrial level shunt detected by color flow Doppler.  LEFT VENTRICLE PLAX 2D LVIDd:         4.40 cm   Diastology LVIDs:         2.20 cm   LV e' medial:    7.94 cm/s LV PW:         1.00 cm   LV E/e' medial:  12.2 LV IVS:        1.20 cm   LV e' lateral:   9.79 cm/s LVOT diam:     1.90 cm   LV E/e' lateral: 9.9 LV SV:         95 LV SV Index:   43 LVOT Area:     2.84 cm  RIGHT VENTRICLE RV Basal diam:  3.70 cm RV Mid diam:    3.50 cm RV S prime:     11.40 cm/s LEFT ATRIUM             Index        RIGHT ATRIUM           Index LA diam:        3.80 cm 1.74 cm/m   RA Area:     19.60 cm LA Vol (A2C):   95.5 ml 43.65 ml/m  RA Volume:   58.60 ml  26.78 ml/m LA Vol (A4C):   77.8 ml 35.56 ml/m LA Biplane Vol: 87.2 ml 39.86 ml/m  AORTIC VALVE                    PULMONIC VALVE AV Area (Vmax):    2.43 cm     PV Vmax:       1.11 m/s AV Area (Vmean):   2.54 cm     PV Peak grad:  4.9 mmHg AV Area (VTI):  2.45 cm AV Vmax:           146.00 cm/s AV Vmean:          91.000 cm/s AV VTI:            0.387 m AV Peak Grad:      8.5 mmHg AV Mean Grad:      4.0 mmHg LVOT Vmax:         125.00 cm/s LVOT Vmean:        81.600 cm/s LVOT VTI:          0.335 m LVOT/AV VTI ratio: 0.87  AORTA Ao Root diam: 3.20 cm MITRAL VALVE                TRICUSPID VALVE MV Area (PHT): 2.83 cm     TR  Peak grad:   26.8 mmHg MV Area VTI:   2.05 cm     TR Vmax:        259.00 cm/s MV Peak grad:  5.5 mmHg MV Mean grad:  1.0 mmHg     SHUNTS MV Vmax:       1.17 m/s     Systemic VTI:  0.34 m MV Vmean:      47.7 cm/s    Systemic Diam: 1.90 cm MV Decel Time: 268 msec MV E velocity: 96.90 cm/s MV A velocity: 112.00 cm/s MV E/A ratio:  0.87 Debbe Odea MD Electronically signed by Debbe Odea MD Signature Date/Time: 06/13/2023/3:51:46 PM    Final    MR BRAIN WO CONTRAST  Result Date: 06/13/2023 EXAM: MRI HEAD WITHOUT CONTRAST TECHNIQUE: Multiplanar, multiecho pulse sequences of the brain and surrounding structures were obtained without intravenous contrast. COMPARISON:  MRI head December 19, 2004. FINDINGS: Brain: Small acute or subacute right thalamic infarct. Remote perforator infarct in left basal ganglia. Additional scattered T2/FLAIR hyperintensities in white matter compatible with chronic microvascular ischemic disease. No acute hemorrhage, mass lesion, midline shift or hydrocephalus. Vascular: Major arterial flow voids are maintained Skull and upper cervical spine: Normal marrow signal. Sinuses/Orbits: Clear sinuses.  No acute orbital findings. Other: No mastoid effusions. IMPRESSION: 1. Small acute or subacute right thalamic infarct. 2. Remote perforator infarct in left basal ganglia and chronic microvascular ischemic disease. Electronically Signed   By: Feliberto Harts M.D.   On: 06/13/2023 15:41   CT ANGIO HEAD NECK W WO CM (CODE STROKE)  Result Date: 06/12/2023 CLINICAL DATA:  Neuro deficit, acute, stroke suspected EXAM: CT ANGIOGRAPHY HEAD AND NECK WITH AND WITHOUT CONTRAST TECHNIQUE: Multidetector CT imaging of the head and neck was performed using the standard protocol during bolus administration of intravenous contrast. Multiplanar CT image reconstructions and MIPs were obtained to evaluate the vascular anatomy. Carotid stenosis measurements (when applicable) are obtained utilizing NASCET  criteria, using the distal internal carotid diameter as the denominator. RADIATION DOSE REDUCTION: This exam was performed according to the departmental dose-optimization program which includes automated exposure control, adjustment of the mA and/or kV according to patient size and/or use of iterative reconstruction technique. CONTRAST:  75mL OMNIPAQUE IOHEXOL 350 MG/ML SOLN COMPARISON:  CT head from today. FINDINGS: CTA NECK FINDINGS Aortic arch: Great vessel origins are patent. Aortic atherosclerosis. Right carotid system: No evidence of dissection, stenosis (50% or greater), or occlusion. Left carotid system: No evidence of dissection, stenosis (50% or greater), or occlusion. Vertebral arteries: Right dominant. No evidence of dissection, stenosis (50% or greater), or occlusion. Skeleton: No obvious acute abnormality on limited assessment. Multilevel facet and uncovertebral hypertrophy. Other  neck: Approximately 1.7 cm left thyroid nodule. Upper chest: Visualized lung apices are clear. Review of the MIP images confirms the above findings CTA HEAD FINDINGS Anterior circulation: Right A1 ACAs hypoplastic. Otherwise, Bilateral intracranial ICAs, MCAs, and ACAs are patent without proximal hemodynamically significant stenosis. Posterior circulation: Non dominant left vertebral artery terminates as PICA, anatomic variant. Venous sinuses: As permitted by contrast timing, patent. Anatomic variants: Detailed above. Review of the MIP images confirms the above findings IMPRESSION: 1. No large vessel occlusion or proximal high-grade stenosis. 2. Approximately 1.7 cm left thyroid nodule. Recommend thyroid ultrasound (ref: J Am Coll Radiol. 2015 Feb;12(2): 143-50). 3.  Aortic Atherosclerosis (ICD10-I70.0). Electronically Signed   By: Feliberto Harts M.D.   On: 06/12/2023 13:07   CT HEAD CODE STROKE WO CONTRAST  Result Date: 06/12/2023 CLINICAL DATA:  Code stroke.  Neuro deficit, acute, stroke suspected EXAM: CT HEAD  WITHOUT CONTRAST TECHNIQUE: Contiguous axial images were obtained from the base of the skull through the vertex without intravenous contrast. RADIATION DOSE REDUCTION: This exam was performed according to the departmental dose-optimization program which includes automated exposure control, adjustment of the mA and/or kV according to patient size and/or use of iterative reconstruction technique. COMPARISON:  CT head 04/11/2019. FINDINGS: Brain: Prior perforator infarct in the left basal ganglia. No evidence of acute large vascular territory infarct, acute hemorrhage, mass lesion or midline shift. Vascular: No hyperdense vessel identified. Skull: No acute fracture. Sinuses/Orbits: Clear sinuses.  No acute orbital findings. Other: No mastoid effusions. ASPECTS [facility] Stroke Program Early CT Score) total score (0-10 with 10 being normal): 10. Dr. Thad Ranger paged at the time of dictation for call of report. IMPRESSION: 1. No evidence of acute intracranial abnormality. 2. ASPECTS is 10. Electronically Signed   By: Feliberto Harts M.D.   On: 06/12/2023 12:33    Assessment and Plan 67 y.o. female  has a past medical history of Cancer (HCC), Chicken pox, Colon polyps, Depression, Diabetes mellitus (HCC), GERD (gastroesophageal reflux disease), History of kidney cancer, Hyperlipidemia, Hypertension Previous Stroke with residual right hemiparesis(brain stem 2001), and Urinary incontinence. Presents with acute onset left sided symptoms.  Patient was at home when she noted numbness on her left side that spread to include her entire left side.  She noted some weakness as well.  Patient received TNKase for acute CVA. Transferred to [facility] hospitalist today.  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 Thad Ranger start aspirin plus Plavix for 21 days and then Plavix alone if no hemorrhage noted on MRI -- continue statins (atorvastatin)-- hold  home crestor -- patient hemodynamically stable -- PT OT to work with her -- Echo shows EF of 65 to 70%  History of hypertension -- BP soft holding home meds -- one stable resume   Sinus bradycardia -- patient is symptomatic  HL -- started on high intensity atorvastatin. Hold home dose of Crestor  Transfer out of ICU to MedSurg with telemetry. Discussed results of MRI with patient on the phone. Discussed medications and secondary prevention for CVA. Patient appreciated call.   Procedures: Family communication : none Consults : neuro-, ICU CODE STATUS: full DVT Prophylaxis : Lovenox Level of care: Med-Surg Status is: Inpatient Remains inpatient appropriate because: CVA PT/OT to see pt   if remains stable discharge to home tomorrow. TOTAL TIME TAKING CARE OF THIS PATIENT: 45 minutes.  >50% time spent on counselling and coordination of care  Note: This dictation  was prepared with Dragon dictation along with smaller phrase technology. Any transcriptional errors that result from this process are unintentional.  Enedina Finner M.D    [facility]   CC: Primary care physician; [place], [place]

## 2023-06-13 NOTE — Progress Notes (Addendum)
SLP Cancellation Note  Patient Details Name: Jacqueline Nichols MRN: [id number] DOB: [birth year]   Cancelled treatment:       Reason Eval/Treat Not Completed: SLP screened, no needs identified, will sign off (chart reviewed; consulted NSG then met w/ pt in room.)  Pt denied any difficulty swallowing and is currently on a regular diet; tolerates swallowing pills w/ water per NSG. Pt conversed in conversation w/out expressive/receptive deficits noted; pt denied any speech-language deficits. Speech clear. No further skilled ST services indicated as pt appears at her communication and swallowing baseline. Pt agreed. NSG to reconsult if any change in status while admitted.       Jerilynn Som, MS, CCC-SLP Speech Language Pathologist Rehab Services; [facility] [phone] (ascom) Kurt Azimi 06/13/2023, 10:19 AM

## 2023-06-13 NOTE — Progress Notes (Signed)
OT Cancellation Note  Patient Details Name: Jacqueline Nichols MRN: [id number] DOB: June 03, 1956   Cancelled Treatment:    Reason Eval/Treat Not Completed: Patient not medically ready. Chart reviewed. Results pending from MRI with bed rest orders. Patient also getting Echo at bedside currently. Will follow up and initiate services as appropriate.   Kathie Dike, M.S. OTR/L  06/13/23, 3:21 PM  ascom [phone]

## 2023-06-13 NOTE — Progress Notes (Signed)
PT Cancellation Note  Patient Details Name: Jacqueline Nichols MRN: [id number] DOB: 1955-11-13   Cancelled Treatment:    Reason Eval/Treat Not Completed: Other (comment) (Results pending from MRI. Patient also getting Echo at bedside currently. PT will follow up as appropriate.)  Donna Bernard, PT, MPT  Ina Homes 06/13/2023, 2:20 PM

## 2023-06-13 NOTE — Progress Notes (Signed)
Patient alert and oriented x4. Vitals stable. Patient transferred to floor. Tolerated well.

## 2023-06-14 ENCOUNTER — Inpatient Hospital Stay: Payer: Medicare HMO

## 2023-06-14 DIAGNOSIS — I639 Cerebral infarction, unspecified: Secondary | ICD-10-CM | POA: Diagnosis not present

## 2023-06-14 LAB — RENAL FUNCTION PANEL
Albumin: 3.6 g/dL (ref 3.5–5.0)
Anion gap: 10 (ref 5–15)
BUN: 16 mg/dL (ref 8–23)
CO2: 26 mmol/L (ref 22–32)
Calcium: 8.8 mg/dL — ABNORMAL LOW (ref 8.9–10.3)
Chloride: 102 mmol/L (ref 98–111)
Creatinine, Ser: 1.01 mg/dL — ABNORMAL HIGH (ref 0.44–1.00)
GFR, Estimated: >60 mL/min (ref >60)
Glucose, Bld: 102 mg/dL — ABNORMAL HIGH (ref 70–99)
Phosphorus: 4.7 mg/dL — ABNORMAL HIGH (ref 2.5–4.6)
Potassium: 3.9 mmol/L (ref 3.5–5.1)
Sodium: 138 mmol/L (ref 135–145)

## 2023-06-14 MED ORDER — ASPIRIN 81 MG PO TBEC
81.0000 mg | DELAYED_RELEASE_TABLET | Freq: Every day | ORAL | Status: DC
  Administered 2023-06-14: 81 mg via ORAL
  Filled 2023-06-14: qty 1

## 2023-06-14 MED ORDER — ASPIRIN 81 MG PO TBEC: 81.0000 mg | DELAYED_RELEASE_TABLET | Freq: Every day | ORAL | 0 refills | Status: AC

## 2023-06-14 MED ORDER — ROSUVASTATIN CALCIUM 10 MG PO TABS: 10.0000 mg | ORAL_TABLET | Freq: Every day | ORAL | 2 refills | Status: DC

## 2023-06-14 MED ORDER — CLOPIDOGREL BISULFATE 75 MG PO TABS
75.0000 mg | ORAL_TABLET | Freq: Every day | ORAL | Status: DC
  Administered 2023-06-14: 75 mg via ORAL
  Filled 2023-06-14: qty 1

## 2023-06-14 NOTE — Discharge Summary (Signed)
Physician Discharge Summary   Jacqueline Nichols  female DOB: 10-19-1955  ZOX:[id number]  PCP: Dale Covington, MD  Admit date: 06/12/2023 Discharge date: 06/14/2023  Admitted From: home Disposition:  home CODE STATUS: Full code  Discharge Instructions     Ambulatory referral to Neurology   Complete by: As directed    An appointment is requested in approximately: 4 weeks   Diet - low sodium heart healthy   Complete by: As directed    Discharge instructions   Complete by: As directed    You have a new small stroke.  Neurologist recommends taking aspirin 81 mg and Plavix together for 21 days, and then back to Plavix alone.  Your Crestor is increased from 5 mg to 10 mg daily.  You have been referred to see Neurology Dr. Malvin Johns as outpatient. [facility] Course:  For full details, please see H&P, progress notes, consult notes and ancillary notes.  Briefly,  Jacqueline Nichols is a 67 y.o. 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  Patient was at home when she noted numbness on her left side that spread to include her entire left side.  She noted some weakness as well.   Patient received TNKase for acute CVA.   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 Thad Ranger start aspirin plus Plavix for 21 days and then home Plavix alone. --LDL 56, however, Dr. Thad Ranger recommended increasing home Crestor from 5 mg to 10 mg daily. --refer to outpatient neuro Dr. Malvin Johns for f/u.   History of hypertension -- resume home BP meds (as below) after discharge.   Sinus bradycardia -- patient is asymptomatic    Unless noted above, medications under "STOP" list are ones pt was not taking PTA.  Discharge Diagnoses:  Principal  Problem:   Acute cerebral infarction [facility])   30 Day Unplanned Readmission Risk Score    Flowsheet Row ED to Hosp-Admission (Current) from 06/12/2023 in [facility] 1C MEDICAL TELEMETRY  30 Day Unplanned Readmission Risk Score (%) 13.73 Filed at 06/14/2023 1200       This score is the patient's risk of an unplanned readmission within 30 days of being discharged (0 -100%). The score is based on dignosis, age, lab data, medications, orders, and past utilization.   Low:  0-14.9   Medium: 15-21.9   High: 22-29.9   Extreme: 30 and above         Discharge Instructions:  Allergies as of 06/14/2023       Reactions   Codeine Nausea Only   Vicodin [hydrocodone-acetaminophen] Itching        Medication List     STOP taking these medications    clonazePAM 0.5 MG tablet Commonly known as: KLONOPIN       TAKE these medications    ALLERGY RELEAF SYSTEM PO Take by mouth. Reported on 03/20/2016   aspirin EC 81 MG tablet Take 1 tablet (81 mg total) by mouth daily for 21 days. Swallow whole. Start taking on: June 15, 2023   bisoprolol-hydrochlorothiazide 2.5-6.25 MG tablet Commonly known as: ZIAC TAKE 1 TABLET BY MOUTH DAILY FOR HIGH BLOOD PRESSURE   cholecalciferol 25 MCG (1000 UNIT) tablet Commonly known as: VITAMIN D3 Take 1,000 Units by mouth daily.   citalopram  40 MG tablet Commonly known as: CELEXA TAKE 1 TABLET BY MOUTH DAILY   clopidogrel 75 MG tablet Commonly known as: PLAVIX TAKE 1 TABLET BY MOUTH EVERY DAY   diphenoxylate-atropine 2.5-0.025 MG tablet Commonly known as: LOMOTIL TAKE 1 TABLET BY MOUTH 4 TIMES DAILY AS NEEDED FOR DIARRHEA OR LOOSE STOOLS   gabapentin 300 MG capsule Commonly known as: NEURONTIN TAKE 1 CAPSULE BY MOUTH AT BEDTIME   IMODIUM PO Take by mouth as needed.   meloxicam 7.5 MG tablet Commonly known as: MOBIC Take 1 tablet (7.5 mg total) by mouth 2 (two) times daily.   nystatin cream Commonly known as:  MYCOSTATIN Apply 1 application topically 2 (two) times daily.   nystatin powder Commonly known as: MYCOSTATIN/NYSTOP Apply 1 Application topically 2 (two) times daily.   rosuvastatin 10 MG tablet Commonly known as: CRESTOR Take 1 tablet (10 mg total) by mouth daily. Increased from 5 mg. What changed:  medication strength how much to take additional instructions   tiZANidine 4 MG tablet Commonly known as: ZANAFLEX TAKE 1 TABLET BY MOUTH AT BEDTIME AS NEEDED FOR MUSCLE SPASMS   torsemide 20 MG tablet Commonly known as: DEMADEX TAKE 1 TABLET BY MOUTH EVERY DAY AS NEEDED   vitamin B-12 250 MCG tablet Commonly known as: CYANOCOBALAMIN Take 250 mcg by mouth daily.         Follow-up Information     Dale Duncan, MD Follow up in 1 week(s).   Specialty: Internal Medicine Contact information: [address] [place] [address] [phone]                 Allergies  Allergen Reactions   Codeine Nausea Only   Vicodin [Hydrocodone-Acetaminophen] Itching     The results of significant diagnostics from this hospitalization (including imaging, microbiology, ancillary and laboratory) are listed below for reference.   Consultations:   Procedures/Studies: US ABDOMEN LIMITED RUQ (LIVER/GB)  Result Date: 06/14/2023 CLINICAL DATA:  67 year old female with liver lesion. History of left renal cell cancer, nephrectomy. EXAM: ULTRASOUND ABDOMEN LIMITED RIGHT UPPER QUADRANT COMPARISON:  Noncontrast CT Abdomen and Pelvis 09/21/2020 and earlier. FINDINGS: Gallbladder: Surgically absent. Common bile duct: Diameter: 9 mm. Liver: Mildly lobulated low-density area in the posterior right hepatic lobe by CT in 2022 is not identified by ultrasound at this time. But that appear to be a chronic hepatic cyst, stable by CT since 2019. Background liver echogenicity within normal limits. No discrete liver lesion. Portal vein is patent on color Doppler imaging with  normal direction of blood flow towards the liver. Other: Negative visible right kidney IMPRESSION: Negative right upper quadrant ultrasound status post cholecystectomy; chronic right lower hepatic lobe benign cyst not visible by ultrasound. Electronically Signed   By: Odessa Fleming M.D.   On: 06/14/2023 11:33   CT HEAD WO CONTRAST ( )  Result Date: 06/14/2023 CLINICAL DATA:  67 year old female code stroke presentation on 06/12/2023. Small acute right thalamic infarct on MRI. EXAM: CT HEAD WITHOUT CONTRAST TECHNIQUE: Contiguous axial images were obtained from the base of the skull through the vertex without intravenous contrast. RADIATION DOSE REDUCTION: This exam was performed according to the departmental dose-optimization program which includes automated exposure control, adjustment of the mA and/or kV according to patient size and/or use of iterative reconstruction technique. COMPARISON:  Brain MRI 06/13/2023 and earlier. FINDINGS: Brain: Minimal linear hyperdensity now at the right thalamus corresponding to abnormal diffusion on 06/13/2023. No hemorrhage or mass effect. Superimposed chronic left corona radiata, internal capsule  and lentiform lacunar infarct. No midline shift, ventriculomegaly, mass effect, evidence of mass lesion, intracranial hemorrhage or evidence of cortically based acute infarction. Vascular: No suspicious intracranial vascular hyperdensity. Skull: Intact, negative. Sinuses/Orbits: Visualized paranasal sinuses and mastoids are stable and well aerated. Other: Visualized orbits and scalp soft tissues are within normal limits. IMPRESSION: 1. Known small right thalamic infarct is subtle by CT. No hemorrhage or mass effect. 2. No new intracranial abnormality. Stable chronic left hemisphere small vessel ischemia. Electronically Signed   By: Odessa Fleming M.D.   On: 06/14/2023 11:29   ECHOCARDIOGRAM COMPLETE  Result Date: 06/13/2023    ECHOCARDIOGRAM REPORT   Patient Name:   MARTHE DANT Date of  Exam: 06/13/2023 Medical Rec #:  [id number]       Height:       70.0 in Accession #:    [id number]      Weight:       223.4 lb Date of Birth:  Apr 27, 1956        BSA:          2.188 m Patient Age:    67 years        BP:           130/75 mmHg Patient Gender: F               HR:           43 bpm. Exam Location:  [facility] Procedure: 2D Echo, Cardiac Doppler and Color Doppler Indications:     Stroke  History:         Patient has no prior history of Echocardiogram examinations.                  Stroke, Arrythmias:Tachycardia and Bradycardia,                  Signs/Symptoms:Fatigue and Syncope; Risk Factors:Hypertension                  and Diabetes.  Sonographer:     Mikki Harbor Referring Phys:  [id number] LESLIE REYNOLDS Diagnosing Phys: Debbe Odea MD IMPRESSIONS  1. Left ventricular ejection fraction, by estimation, is 65 to 70%. The left ventricle has normal function. The left ventricle has no regional wall motion abnormalities. Left ventricular diastolic parameters were normal.  2. Right ventricular systolic function is normal. The right ventricular size is normal. There is normal pulmonary artery systolic pressure.  3. Left atrial size was mildly dilated.  4. The mitral valve is normal in structure. Mild mitral valve regurgitation.  5. The aortic valve is tricuspid. Aortic valve regurgitation is not visualized.  6. The inferior vena cava is normal in size with greater than 50% respiratory variability, suggesting right atrial pressure of 3 mmHg. FINDINGS  Left Ventricle: Left ventricular ejection fraction, by estimation, is 65 to 70%. The left ventricle has normal function. The left ventricle has no regional wall motion abnormalities. The left ventricular internal cavity size was normal in size. There is  no left ventricular hypertrophy. Left ventricular diastolic parameters were normal. Right Ventricle: The right ventricular size is normal. No increase in right ventricular wall thickness. Right ventricular systolic  function is normal. There is normal pulmonary artery systolic pressure. The tricuspid regurgitant velocity is 2.59 m/s, and  with an assumed right atrial pressure of 3 mmHg, the estimated right ventricular systolic pressure is 29.8 mmHg. Left Atrium: Left atrial size was mildly dilated. Right Atrium: Right atrial size was normal in size. Pericardium: There  is no evidence of pericardial effusion. Mitral Valve: The mitral valve is normal in structure. Mild mitral valve regurgitation. MV peak gradient, 5.5 mmHg. The mean mitral valve gradient is 1.0 mmHg. Tricuspid Valve: The tricuspid valve is normal in structure. Tricuspid valve regurgitation is mild. Aortic Valve: The aortic valve is tricuspid. Aortic valve regurgitation is not visualized. Aortic valve mean gradient measures 4.0 mmHg. Aortic valve peak gradient measures 8.5 mmHg. Aortic valve area, by VTI measures 2.45 cm. Pulmonic Valve: The pulmonic valve was normal in structure. Pulmonic valve regurgitation is not visualized. Aorta: The aortic root is normal in size and structure. Venous: The inferior vena cava is normal in size with greater than 50% respiratory variability, suggesting right atrial pressure of 3 mmHg. IAS/Shunts: No atrial level shunt detected by color flow Doppler.  LEFT VENTRICLE PLAX 2D LVIDd:         4.40 cm   Diastology LVIDs:         2.20 cm   LV e' medial:    7.94 cm/s LV PW:         1.00 cm   LV E/e' medial:  12.2 LV IVS:        1.20 cm   LV e' lateral:   9.79 cm/s LVOT diam:     1.90 cm   LV E/e' lateral: 9.9 LV SV:         95 LV SV Index:   43 LVOT Area:     2.84 cm  RIGHT VENTRICLE RV Basal diam:  3.70 cm RV Mid diam:    3.50 cm RV S prime:     11.40 cm/s LEFT ATRIUM             Index        RIGHT ATRIUM           Index LA diam:        3.80 cm 1.74 cm/m   RA Area:     19.60 cm LA Vol (A2C):   95.5 ml 43.65 ml/m  RA Volume:   58.60 ml  26.78 ml/m LA Vol (A4C):   77.8 ml 35.56 ml/m LA Biplane Vol: 87.2 ml 39.86 ml/m  AORTIC VALVE                     PULMONIC VALVE AV Area (Vmax):    2.43 cm     PV Vmax:       1.11 m/s AV Area (Vmean):   2.54 cm     PV Peak grad:  4.9 mmHg AV Area (VTI):     2.45 cm AV Vmax:           146.00 cm/s AV Vmean:          91.000 cm/s AV VTI:            0.387 m AV Peak Grad:      8.5 mmHg AV Mean Grad:      4.0 mmHg LVOT Vmax:         125.00 cm/s LVOT Vmean:        81.600 cm/s LVOT VTI:          0.335 m LVOT/AV VTI ratio: 0.87  AORTA Ao Root diam: 3.20 cm MITRAL VALVE                TRICUSPID VALVE MV Area (PHT): 2.83 cm     TR Peak grad:   26.8 mmHg MV Area VTI:   2.05 cm  TR Vmax:        259.00 cm/s MV Peak grad:  5.5 mmHg MV Mean grad:  1.0 mmHg     SHUNTS MV Vmax:       1.17 m/s     Systemic VTI:  0.34 m MV Vmean:      47.7 cm/s    Systemic Diam: 1.90 cm MV Decel Time: 268 msec MV E velocity: 96.90 cm/s MV A velocity: 112.00 cm/s MV E/A ratio:  0.87 Debbe Odea MD Electronically signed by Debbe Odea MD Signature Date/Time: 06/13/2023/3:51:46 PM    Final    MR BRAIN WO CONTRAST  Result Date: 06/13/2023 EXAM: MRI HEAD WITHOUT CONTRAST TECHNIQUE: Multiplanar, multiecho pulse sequences of the brain and surrounding structures were obtained without intravenous contrast. COMPARISON:  MRI head December 19, 2004. FINDINGS: Brain: Small acute or subacute right thalamic infarct. Remote perforator infarct in left basal ganglia. Additional scattered T2/FLAIR hyperintensities in white matter compatible with chronic microvascular ischemic disease. No acute hemorrhage, mass lesion, midline shift or hydrocephalus. Vascular: Major arterial flow voids are maintained Skull and upper cervical spine: Normal marrow signal. Sinuses/Orbits: Clear sinuses.  No acute orbital findings. Other: No mastoid effusions. IMPRESSION: 1. Small acute or subacute right thalamic infarct. 2. Remote perforator infarct in left basal ganglia and chronic microvascular ischemic disease. Electronically Signed   By: Feliberto Harts M.D.    On: 06/13/2023 15:41   CT ANGIO HEAD NECK W WO CM (CODE STROKE)  Result Date: 06/12/2023 CLINICAL DATA:  Neuro deficit, acute, stroke suspected EXAM: CT ANGIOGRAPHY HEAD AND NECK WITH AND WITHOUT CONTRAST TECHNIQUE: Multidetector CT imaging of the head and neck was performed using the standard protocol during bolus administration of intravenous contrast. Multiplanar CT image reconstructions and MIPs were obtained to evaluate the vascular anatomy. Carotid stenosis measurements (when applicable) are obtained utilizing NASCET criteria, using the distal internal carotid diameter as the denominator. RADIATION DOSE REDUCTION: This exam was performed according to the departmental dose-optimization program which includes automated exposure control, adjustment of the mA and/or kV according to patient size and/or use of iterative reconstruction technique. CONTRAST:  75mL OMNIPAQUE IOHEXOL 350 MG/ML SOLN COMPARISON:  CT head from today. FINDINGS: CTA NECK FINDINGS Aortic arch: Great vessel origins are patent. Aortic atherosclerosis. Right carotid system: No evidence of dissection, stenosis (50% or greater), or occlusion. Left carotid system: No evidence of dissection, stenosis (50% or greater), or occlusion. Vertebral arteries: Right dominant. No evidence of dissection, stenosis (50% or greater), or occlusion. Skeleton: No obvious acute abnormality on limited assessment. Multilevel facet and uncovertebral hypertrophy. Other neck: Approximately 1.7 cm left thyroid nodule. Upper chest: Visualized lung apices are clear. Review of the MIP images confirms the above findings CTA HEAD FINDINGS Anterior circulation: Right A1 ACAs hypoplastic. Otherwise, Bilateral intracranial ICAs, MCAs, and ACAs are patent without proximal hemodynamically significant stenosis. Posterior circulation: Non dominant left vertebral artery terminates as PICA, anatomic variant. Venous sinuses: As permitted by contrast timing, patent. Anatomic  variants: Detailed above. Review of the MIP images confirms the above findings IMPRESSION: 1. No large vessel occlusion or proximal high-grade stenosis. 2. Approximately 1.7 cm left thyroid nodule. Recommend thyroid ultrasound (ref: J Am Coll Radiol. 2015 Feb;12(2): 143-50). 3.  Aortic Atherosclerosis (ICD10-I70.0). Electronically Signed   By: Feliberto Harts M.D.   On: 06/12/2023 13:07   CT HEAD CODE STROKE WO CONTRAST  Result Date: 06/12/2023 CLINICAL DATA:  Code stroke.  Neuro deficit, acute, stroke suspected EXAM: CT HEAD WITHOUT CONTRAST TECHNIQUE: Contiguous axial  images were obtained from the base of the skull through the vertex without intravenous contrast. RADIATION DOSE REDUCTION: This exam was performed according to the departmental dose-optimization program which includes automated exposure control, adjustment of the mA and/or kV according to patient size and/or use of iterative reconstruction technique. COMPARISON:  CT head 04/11/2019. FINDINGS: Brain: Prior perforator infarct in the left basal ganglia. No evidence of acute large vascular territory infarct, acute hemorrhage, mass lesion or midline shift. Vascular: No hyperdense vessel identified. Skull: No acute fracture. Sinuses/Orbits: Clear sinuses.  No acute orbital findings. Other: No mastoid effusions. ASPECTS [facility] Stroke Program Early CT Score) total score (0-10 with 10 being normal): 10. Dr. Thad Ranger paged at the time of dictation for call of report. IMPRESSION: 1. No evidence of acute intracranial abnormality. 2. ASPECTS is 10. Electronically Signed   By: Feliberto Harts M.D.   On: 06/12/2023 12:33      Labs: BNP (last 3 results) No results for input(s): "BNP" in the last 8760 hours. Basic Metabolic Panel: Recent Labs  Lab 06/12/23 1236 06/13/23 0327 06/14/23 0544  NA 133* 138 138  K 4.0 3.9 3.9  CL 101 102 102  CO2 24 27 26   GLUCOSE 109* 95 102*  BUN 15 14 16   CREATININE 0.84 1.02* 1.01*  CALCIUM 9.0 8.9 8.8*   MG  --  2.2  --   PHOS  --  4.1 4.7*   Liver Function Tests: Recent Labs  Lab 06/12/23 1236 06/13/23 0327 06/14/23 0544  AST 19  --   --   ALT 13  --   --   ALKPHOS 73  --   --   BILITOT 0.7  --   --   PROT 6.6  --   --   ALBUMIN 4.0 3.6 3.6   No results for input(s): "LIPASE", "AMYLASE" in the last 168 hours. No results for input(s): "AMMONIA" in the last 168 hours. CBC: Recent Labs  Lab 06/12/23 1236 06/13/23 0327  WBC 5.9 4.6  NEUTROABS 3.5  --   HGB 13.4 12.5  HCT 40.0 36.8  MCV 88.1 88.5  PLT 147* 134*   Cardiac Enzymes: No results for input(s): "CKTOTAL", "CKMB", "CKMBINDEX", "TROPONINI" in the last 168 hours. BNP: Invalid input(s): "POCBNP" CBG: Recent Labs  Lab 06/12/23 1218 06/12/23 2045  GLUCAP 99 87   D-Dimer No results for input(s): "DDIMER" in the last 72 hours. Hgb A1c Recent Labs    06/13/23 0327  HGBA1C 5.6   Lipid Profile Recent Labs    06/13/23 0327  CHOL 119  HDL 41  LDLCALC 56  TRIG 109  CHOLHDL 2.9   Thyroid function studies No results for input(s): "TSH", "T4TOTAL", "T3FREE", "THYROIDAB" in the last 72 hours.  Invalid input(s): "FREET3" Anemia work up No results for input(s): "VITAMINB12", "FOLATE", "FERRITIN", "TIBC", "IRON", "RETICCTPCT" in the last 72 hours. Urinalysis    Component Value Date/Time   COLORURINE YELLOW (A) 06/12/2023 1522   APPEARANCEUR CLEAR (A) 06/12/2023 1522   APPEARANCEUR Clear 03/27/2018 1525   LABSPEC 1.014 06/12/2023 1522   LABSPEC 1.003 09/22/2012 1310   PHURINE 8.0 06/12/2023 1522   GLUCOSEU NEGATIVE 06/12/2023 1522   GLUCOSEU Negative 09/22/2012 1310   HGBUR NEGATIVE 06/12/2023 1522   BILIRUBINUR NEGATIVE 06/12/2023 1522   BILIRUBINUR Negative 03/27/2018 1525   BILIRUBINUR Negative 09/22/2012 1310   KETONESUR NEGATIVE 06/12/2023 1522   PROTEINUR NEGATIVE 06/12/2023 1522   UROBILINOGEN 0.2 12/04/2012 1352   NITRITE NEGATIVE 06/12/2023 1522   LEUKOCYTESUR NEGATIVE  06/12/2023 1522    LEUKOCYTESUR 1+ 09/22/2012 1310   Sepsis Labs Recent Labs  Lab 06/12/23 1236 06/13/23 0327  WBC 5.9 4.6   Microbiology Recent Results (from the past 240 hour(s))  MRSA Next Gen by PCR, Nasal     Status: None   Collection Time: 06/12/23  9:26 PM   Specimen: Nasal Mucosa; Nasal Swab  Result Value Ref Range Status   MRSA by PCR Next Gen NOT DETECTED NOT DETECTED Final    Comment: (NOTE) The GeneXpert MRSA Assay (FDA approved for NASAL specimens only), is one component of a comprehensive MRSA colonization surveillance program. It is not intended to diagnose MRSA infection nor to guide or monitor treatment for MRSA infections. Test performance is not FDA approved in patients less than 24 years old. Performed at [facility], [address] [place], [place] [address]      Total time spend on discharging this patient, including the last patient exam, discussing the hospital stay, instructions for ongoing care as it relates to all pertinent caregivers, as well as preparing the medical discharge records, prescriptions, and/or referrals as applicable, is 35 minutes.    Darlin Priestly, MD  [facility] 06/14/2023, 1:45 PM

## 2023-06-14 NOTE — Progress Notes (Signed)
NEUROLOGY CONSULT FOLLOW UP NOTE   Date of service: June 14, 2023 Patient Name: Jacqueline Nichols MRN:  [id number] DOB:  01-Oct-1955  Brief HPI  Jacqueline Nichols is a 67 y.o. female  has a past medical history of Cancer (HCC), Chicken pox, Colon polyps, Depression, Diabetes mellitus (HCC), GERD (gastroesophageal reflux disease), History of kidney cancer, Hyperlipidemia, Hypertension, Stroke (HCC), and Urinary incontinence. who presented with acute onset left sided numbness and weakness.    Interval Hx/subjective   Patient reports no new events overnight.  Continues to report feeling at baseline.  Vitals   Vitals:   06/13/23 2000 06/13/23 2356 06/14/23 0356 06/14/23 0751  BP: (!) 115/58 (!) 103/55 (!) 111/52 (!) 125/56  Pulse: (!) 50 (!) 45 (!) 43 (!) 48  Resp: 16 16 16 14   Temp: 97.6 F (36.4 C) 97.8 F (36.6 C) 97.8 F (36.6 C) 97.9 F (36.6 C)  TempSrc: Oral Oral Oral   SpO2: 97% 99% 99% 97%  Weight:      Height:         Body mass index is 32.05 kg/m.  Physical Exam   Constitutional: Appears well-developed and well-nourished.  Psych: Affect appropriate to situation.  Eyes: No scleral injection.  Head: Normocephalic.  Cardiovascular: Normal rate and regular rhythm.  Respiratory: Effort normal, non-labored breathing.   Neurologic Examination   NIHSS components Score: Comment  1a Level of Conscious 0[x]  1[]  2[]  3[]      1b LOC Questions 0[x]  1[]  2[]       1c LOC Commands 0[x]  1[]  2[]       2 Best Gaze 0[x]  1[]  2[]       3 Visual 0[x]  1[]  2[]  3[]      4 Facial Palsy 0[x]  1[]  2[]  3[]      5a Motor Arm - left 0[x]  1[]  2[]  3[]  4[]  UN[]    5b Motor Arm - Right 0[]  1[x]  2[]  3[]  4[]  UN[]    6a Motor Leg - Left 0[x]  1[]  2[]  3[]  4[]  UN[]    6b Motor Leg - Right 0[]  1[x]  2[]  3[]  4[]  UN[]    7 Limb Ataxia 0[x]  1[]  2[]  3[]  UN[]     8 Sensory 0[x]  1[]  2[]  UN[]      9 Best Language 0[x]  1[]  2[]  3[]      10 Dysarthria 0[x]  1[]  2[]  UN[]      11 Extinct. and Inattention 0[x]  1[]  2[]        TOTAL: 2      Labs and Diagnostic Imaging   CBC:  Recent Labs  Lab 06/12/23 1236 06/13/23 0327  WBC 5.9 4.6  NEUTROABS 3.5  --   HGB 13.4 12.5  HCT 40.0 36.8  MCV 88.1 88.5  PLT 147* 134*    Basic Metabolic Panel:  Lab Results  Component Value Date   NA 138 06/14/2023   K 3.9 06/14/2023   CO2 26 06/14/2023   GLUCOSE 102 (H) 06/14/2023   BUN 16 06/14/2023   CREATININE 1.01 (H) 06/14/2023   CALCIUM 8.8 (L) 06/14/2023   GFRNONAA >60 06/14/2023   GFRAA 76 07/07/2020   Lipid Panel:  Lab Results  Component Value Date   LDLCALC 56 06/13/2023   HgbA1c:  Lab Results  Component Value Date   HGBA1C 5.6 06/13/2023   Urine Drug Screen:     Component Value Date/Time   LABOPIA NONE DETECTED 06/12/2023 1522   COCAINSCRNUR NONE DETECTED 06/12/2023 1522   LABBENZ NONE DETECTED 06/12/2023 1522   AMPHETMU NONE DETECTED 06/12/2023 1522   THCU NONE DETECTED 06/12/2023 1522  LABBARB NONE DETECTED 06/12/2023 1522    Alcohol Level     Component Value Date/Time   ETH <10 06/12/2023 1236   INR  Lab Results  Component Value Date   INR 0.9 06/12/2023   APTT  Lab Results  Component Value Date   APTT 26 06/12/2023   AED levels: No results found for: "PHENYTOIN", "ZONISAMIDE", "LAMOTRIGINE", "LEVETIRACETA"  MRI Brain(Personally reviewed): MRI HEAD WITHOUT CONTRAST   TECHNIQUE: Multiplanar, multiecho pulse sequences of the brain and surrounding structures were obtained without intravenous contrast.   COMPARISON:  MRI head December 19, 2004.   FINDINGS: Brain: Small acute or subacute right thalamic infarct. Remote perforator infarct in left basal ganglia. Additional scattered T2/FLAIR hyperintensities in white matter compatible with chronic microvascular ischemic disease. No acute hemorrhage, mass lesion, midline shift or hydrocephalus.   Vascular: Major arterial flow voids are maintained   Skull and upper cervical spine: Normal marrow signal.   Sinuses/Orbits:  Clear sinuses.  No acute orbital findings.   Other: No mastoid effusions.   IMPRESSION: 1. Small acute or subacute right thalamic infarct. 2. Remote perforator infarct in left basal ganglia and chronic microvascular ischemic disease.   Impression   Jacqueline Nichols is a 67 y.o. female  with a past medical history of Cancer (HCC), Chicken pox, Colon polyps, Depression, Diabetes mellitus (HCC), GERD (gastroesophageal reflux disease), History of kidney cancer, Hyperlipidemia, Hypertension, Stroke with residual right hemiparesis(brain stem 2001), and Urinary incontinence.who presents with acute onset left sided numbness and weakness. Received TNK in the ED.  No complications noted.  Reports being back to baseline today.  LDL 56 on Crestor.  A1c 5.6.  Echocardiogram shows EF of 65-70% with no cardiac source of emboli noted.   MRI of the brain personally reviewed and reveals an acute right thalamic infarct.  Etiology likely small vessel disease.    Recommendations  Would continue statin ASA 81mg  and Plavix 75 mg with ASA to be discontinued after 3 weeks Patient to follow up with neurology on an outpatient basis.  Stable for discharge neurologically after therapy evaluations   ______________________________________________________________________   Thank you for the opportunity to take part in the care of this patient. If you have any further questions, please contact the neurology consultation team on call. Updated oncall schedule is listed on AMION.  Signed,  Hazelyn Kallen

## 2023-06-14 NOTE — TOC CM/SW Note (Signed)
Transition of Care [facility]) - Inpatient Brief Assessment   Patient Details  Name: Jacqueline Nichols MRN: [id number] Date of Birth: 04-20-1956  Transition of Care [facility]) CM/SW Contact:    Liliana Cline, LCSW Phone Number: 06/14/2023, 12:34 PM  Transition of Care Asessment: Insurance and Status: Insurance coverage has been reviewed Patient has primary care physician: Yes     Prior/Current Home Services: No current home services Social Determinants of Health Reivew: SDOH reviewed no interventions necessary Readmission risk has been reviewed: Yes Transition of care needs: no transition of care needs at this time

## 2023-06-14 NOTE — Plan of Care (Signed)
  Problem: Coping: Goal: Will verbalize positive feelings about self Outcome: Progressing Goal: Will identify appropriate support needs Outcome: Progressing   Problem: Coping: Goal: Will identify appropriate support needs Outcome: Progressing   Problem: Self-Care: Goal: Ability to participate in self-care as condition permits will improve Outcome: Progressing

## 2023-06-14 NOTE — Progress Notes (Signed)
OT Cancellation Note  Patient Details Name: Jacqueline Nichols MRN: [id number] DOB: 1956/07/21   Cancelled Treatment:    Reason Eval/Treat Not Completed: OT screened, no needs identified, will sign off. Pt reports ambulating IND in room, performing ADLs in room IND since last night, and has walked in hallway with PT. Pt endorses feeling at functional baseline, all acute neurological deficits have resolved per pt report. No acute OT needs identified, OT will complete orders and sign off. Educated on option of outpatient OT services should future needs arise.   Deshae Dickison L. Monetta Lick, OTR/L  06/14/23, 1:09 PM

## 2023-06-14 NOTE — Evaluation (Signed)
Physical Therapy Evaluation Patient Details Name: Jacqueline Nichols MRN: [id number] DOB: 1955/12/21 Today's Date: 06/14/2023  History of Present Illness  67 y.o. female  has a past medical history of Cancer (HCC), Chicken pox, Colon polyps, Depression, Diabetes mellitus (HCC), GERD (gastroesophageal reflux disease), History of kidney cancer, Hyperlipidemia, Hypertension  Previous Stroke with residual right hemiparesis(brain stem 2001), and Urinary incontinence.  Patient presents to ED on 06/12/2023 with acute left sided weakness. MRI shows acute right thalamic infarct and Remote perforator infarct in left basal ganglia and chronic microvascular disease. Patient was administered TNK on 06/12/23 at 12:45;  Clinical Impression  67 yo Female s/p acute right thalamic infarct, s/p TNK administration on 06/12/23; She reports feeling at baseline. Patient lives at home with her significant other. She reports being modified independent in all self care ADLs, walking without assistive device, still driving. She does have a history of previous stroke in 2001 which led to residual right sided weakness; as a result she reports she would have to be careful and take it easy. Patient is independent in bed mobility and transfers. She completed 5 times sit<>stand from regular chair, not pushing on arm rests in 19.7 sec (>15 sec does indicate increased risk for falls). She ambulated x175 feet without AD, ambulating at 1.98 feet/sec speed (limited community ambulator). Patient ambulates with reciprocal gait pattern, no unsteadiness noted and good foot clearance. She does exhibit impaired balance on right side being unable to hold SLS >1-2 sec, however was able to maintain SLS for >5 sec on LLE. This is related to previous stroke. Patient reports she is functioning at baseline. No additional skilled needs identified.         If plan is discharge home, recommend the following: Assist for transportation;Assistance with  cooking/housework   Can travel by private vehicle        Equipment Recommendations None recommended by PT  Recommendations for Other Services       Functional Status Assessment Patient has not had a recent decline in their functional status     Precautions / Restrictions Precautions Precautions: Fall Precaution Comments: fell labor day weekend tripping over a step;  no other falls Restrictions Weight Bearing Restrictions: No      Mobility  Bed Mobility Overal bed mobility: Independent                  Transfers Overall transfer level: Independent                 General transfer comment: able to complete 5 times sit<>stand 19.7 sec without using upper extremity, (>15 sec indicates high risk for falls)    Ambulation/Gait Ambulation/Gait assistance: Modified independent (Device/Increase time) Gait Distance (Feet): 175 Feet Assistive device: None Gait Pattern/deviations: Step-through pattern Gait velocity: 10 feet walk test :1.98 feet/sec Gait velocity interpretation: 1.31 - 2.62 ft/sec, indicative of limited community ambulator   General Gait Details: ambulates with reciprocal gait pattern, slightly slower gait speed, no sway, good foot clearance bilaterally;  Stairs            Wheelchair Mobility     Tilt Bed    Modified Rankin (Stroke Patients Only)       Balance Overall balance assessment: Modified Independent                               Standardized Balance Assessment Standardized Balance Assessment :  (SLS: R: 1-2 sec, L: 5 sec)  Pertinent Vitals/Pain Pain Assessment Pain Assessment: No/denies pain    Home Living Family/patient expects to be discharged to:: Private residence Living Arrangements: Spouse/significant other Available Help at Discharge: Family Type of Home: House Home Access: Level entry       Home Layout: One level Home Equipment: Grab bars - tub/shower;Shower Counsellor  (2 wheels);Cane - single point      Prior Function Prior Level of Function : Independent/Modified Independent             Mobility Comments: patient was ambulating without assistive device, still driving; does have residual right side weakness from previous stroke so she would take it easy; ADLs Comments: independent in all self care ADLs.     Extremity/Trunk Assessment   Upper Extremity Assessment Upper Extremity Assessment: Overall WFL for tasks assessed    Lower Extremity Assessment Lower Extremity Assessment: Overall WFL for tasks assessed;Generalized weakness    Cervical / Trunk Assessment Cervical / Trunk Assessment: Normal  Communication   Communication Communication: No apparent difficulties  Cognition Arousal: Alert Behavior During Therapy: WFL for tasks assessed/performed Overall Cognitive Status: Within Functional Limits for tasks assessed                                 General Comments: oriented x4        General Comments General comments (skin integrity, edema, etc.): no swelling noted; intact skin;    Exercises     Assessment/Plan    PT Assessment Patient does not need any further PT services  PT Problem List         PT Treatment Interventions      PT Goals (Current goals can be found in the Care Plan section)  Acute Rehab PT Goals Patient Stated Goal: to go home PT Goal Formulation: With patient Time For Goal Achievement: 06/14/23 Potential to Achieve Goals: Good    Frequency       Co-evaluation               AM-PAC PT "6 Clicks" Mobility  Outcome Measure Help needed turning from your back to your side while in a flat bed without using bedrails?: None Help needed moving from lying on your back to sitting on the side of a flat bed without using bedrails?: None Help needed moving to and from a bed to a chair (including a wheelchair)?: None Help needed standing up from a chair using your arms (e.g., wheelchair or  bedside chair)?: None Help needed to walk in hospital room?: None Help needed climbing 3-5 steps with a railing? : None 6 Click Score: 24    End of Session   Activity Tolerance: Patient tolerated treatment well Patient left: in chair;Other (comment) (OT in the room, starting her evaluation) Nurse Communication: Mobility status PT Visit Diagnosis: Muscle weakness (generalized) (M62.81)    Time: 4540-9811 PT Time Calculation (min) (ACUTE ONLY): 13 min   Charges:   PT Evaluation $PT Eval Low Complexity: 1 Low   PT General Charges $$ ACUTE PT VISIT: 1 Visit           Uniqua Kihn PT, DPT 06/14/2023, 12:28 PM

## 2023-06-14 NOTE — Plan of Care (Signed)
Patient is appropriate for discharge from this facility with family support in stable condition.  Discharge instructions reviewed with patient with patient in agreement.

## 2023-06-16 ENCOUNTER — Telehealth: Payer: Self-pay | Admitting: *Deleted

## 2023-06-16 NOTE — Transitions of Care (Post Inpatient/ED Visit) (Signed)
   06/16/2023  Name: Jacqueline Nichols MRN: [id number] DOB: 11/24/1955  Today's TOC FU Call Status: Today's TOC FU Call Status:: Unsuccessful Call (1st Attempt) Unsuccessful Call (1st Attempt) Date: 06/16/23  Attempted to reach the patient regarding the most recent Inpatient/ED visit.  Follow Up Plan: Additional outreach attempts will be made to reach the patient to complete the Transitions of Care (Post Inpatient/ED visit) call.   Gean Maidens BSN RN [facility] [phone]

## 2023-06-17 ENCOUNTER — Encounter: Payer: Self-pay | Admitting: Gastroenterology

## 2023-06-26 NOTE — Plan of Care (Signed)
[facility] Tonsillectomy/Adenoidectomy, Postoperative PEDS care plan entered in error.

## 2023-07-03 DIAGNOSIS — Z96653 Presence of artificial knee joint, bilateral: Secondary | ICD-10-CM | POA: Diagnosis not present

## 2023-07-07 ENCOUNTER — Other Ambulatory Visit: Payer: Self-pay | Admitting: Internal Medicine

## 2023-07-08 ENCOUNTER — Other Ambulatory Visit: Payer: Self-pay | Admitting: Internal Medicine

## 2023-07-22 ENCOUNTER — Other Ambulatory Visit (HOSPITAL_COMMUNITY)
Admission: RE | Admit: 2023-07-22 | Discharge: 2023-07-22 | Disposition: A | Discharge status: Home / Self Care | Payer: Medicare HMO | Source: Ambulatory Visit | Attending: Internal Medicine | Admitting: Internal Medicine

## 2023-07-22 ENCOUNTER — Ambulatory Visit (INDEPENDENT_AMBULATORY_CARE_PROVIDER_SITE_OTHER): Payer: Medicare HMO | Admitting: Internal Medicine

## 2023-07-22 VITALS — BP 110/70 | HR 70 | Temp 98.0°F | Resp 16 | Ht 70.0 in | Wt 226.2 lb

## 2023-07-22 DIAGNOSIS — E1165 Type 2 diabetes mellitus with hyperglycemia: Secondary | ICD-10-CM | POA: Diagnosis not present

## 2023-07-22 DIAGNOSIS — E7800 Pure hypercholesterolemia, unspecified: Secondary | ICD-10-CM

## 2023-07-22 DIAGNOSIS — Z124 Encounter for screening for malignant neoplasm of cervix: Secondary | ICD-10-CM | POA: Insufficient documentation

## 2023-07-22 DIAGNOSIS — Z1151 Encounter for screening for human papillomavirus (HPV): Secondary | ICD-10-CM | POA: Diagnosis not present

## 2023-07-22 DIAGNOSIS — I10 Essential (primary) hypertension: Secondary | ICD-10-CM | POA: Diagnosis not present

## 2023-07-22 DIAGNOSIS — Z01419 Encounter for gynecological examination (general) (routine) without abnormal findings: Secondary | ICD-10-CM | POA: Diagnosis not present

## 2023-07-22 DIAGNOSIS — Z23 Encounter for immunization: Secondary | ICD-10-CM | POA: Diagnosis not present

## 2023-07-22 DIAGNOSIS — F320 Major depressive disorder, single episode, mild: Secondary | ICD-10-CM

## 2023-07-22 DIAGNOSIS — Z85528 Personal history of other malignant neoplasm of kidney: Secondary | ICD-10-CM | POA: Diagnosis not present

## 2023-07-22 DIAGNOSIS — I700 Atherosclerosis of aorta: Secondary | ICD-10-CM

## 2023-07-22 DIAGNOSIS — R739 Hyperglycemia, unspecified: Secondary | ICD-10-CM

## 2023-07-22 DIAGNOSIS — I471 Supraventricular tachycardia, unspecified: Secondary | ICD-10-CM | POA: Diagnosis not present

## 2023-07-22 DIAGNOSIS — Z0000 Encounter for general adult medical examination without abnormal findings: Secondary | ICD-10-CM

## 2023-07-22 DIAGNOSIS — R160 Hepatomegaly, not elsewhere classified: Secondary | ICD-10-CM

## 2023-07-22 MED ORDER — BUSPIRONE HCL 5 MG PO TABS: 5.0000 mg | ORAL_TABLET | Freq: Every day | ORAL | 1 refills | Status: DC

## 2023-07-22 NOTE — Progress Notes (Signed)
Subjective:    Patient ID: Jacqueline Nichols, female    DOB: 05-30-1956, 67 y.o.   MRN: [id number]  Patient here for  Chief Complaint  Patient presents with   Annual Exam    HPI Here for a physical exam. Admitted 06/12/23 - 06/14/23 - after presenting with acute onset left sided symptoms - numbness and weakness - left side. Received TNKase for acute CVA. MRI - right thalamic small infarct. Placed on aspirin and plavix for 21 days and then plavix alone. Crestor increased to 10mg  daily. F/u with neurology as outpatient. She is doing well since her hospitalization. No residual problems.  Taking plavix only now.  No chest pain or sob reported.  No abdominal pain or bowel change reported. Increased stress - states she is more irritable.  Feels needs something to help level things out.  Taking ciralopram. Discussed buspar.  Try to avoid benzodiazepines.    Past Medical History:  Diagnosis Date   Cancer (HCC)    skin   Chicken pox    Colon polyps    Depression    Diabetes mellitus (HCC)    GERD (gastroesophageal reflux disease)    History of kidney cancer    Hyperlipidemia    Hypertension    Stroke [facility])    brainstem   Urinary incontinence    Past Surgical History:  Procedure Laterality Date   CHOLECYSTECTOMY  2001   COLONOSCOPY WITH PROPOFOL N/A 03/27/2016   Procedure: COLONOSCOPY WITH PROPOFOL;  Surgeon: Midge Minium, MD;  Location: [facility] ENDOSCOPY;  Service: Endoscopy;  Laterality: N/A;   COLONOSCOPY WITH PROPOFOL N/A 08/31/2021   Procedure: COLONOSCOPY WITH PROPOFOL;  Surgeon: Midge Minium, MD;  Location: [facility];  Service: Endoscopy;  Laterality: N/A;   KNEE SURGERY  2003-2009   s/p 8 knee surgeries (with 3 knee replacements)   NEPHRECTOMY RADICAL  07/2015   NISSEN FUNDOPLICATION     Family History  Problem Relation Age of Onset   Colon cancer Mother        died - age 31   Heart disease Father    Hypertension Father    Lymphoma Father    Arthritis/Rheumatoid  Sister    Lung cancer Brother        smoked   Arthritis/Rheumatoid Paternal Grandmother    Cancer Maternal Uncle        Unknown type of cancer    Cancer Paternal Aunt        Unknown type of cancer    Throat cancer Paternal Uncle    Colon cancer Paternal Uncle    Breast cancer Neg Hx    Social History   Socioeconomic History   Marital status: Divorced    Spouse name: Not on file   Number of children: 2   Years of education: Not on file   Highest education level: Not on file  Occupational History   Not on file  Tobacco Use   Smoking status: Former    Current packs/day: 0.00    Average packs/day: 1 pack/day for 15.0 years (15.0 ttl pk-yrs)    Types: Cigarettes    Start date: 10/23/1997    Quit date: 10/23/2012    Years since quitting: 10.7   Smokeless tobacco: Never   Tobacco comments:    Quit Feb 2014  Vaping Use   Vaping status: Former   Start date: 02/20/2013   Quit date: 02/20/2014  Substance and Sexual Activity   Alcohol use: Yes    Alcohol/week: 1.0 standard  drink of alcohol    Types: 1 Glasses of wine per week    Comment: social drinking - approx 2-4 monthly    Drug use: No   Sexual activity: Not Currently  Other Topics Concern   Not on file  Social History Narrative   Not on file   Social Determinants of Health   Financial Resource Strain: Not on file  Food Insecurity: No Food Insecurity (06/12/2023)   Hunger Vital Sign    Worried About Running Out of Food in the Last Year: Never true    Ran Out of Food in the Last Year: Never true  Transportation Needs: No Transportation Needs (06/12/2023)   PRAPARE - Administrator, Civil Service (Medical): No    Lack of Transportation (Non-Medical): No  Physical Activity: Not on file  Stress: Not on file  Social Connections: Not on file     Review of Systems  Constitutional:  Negative for appetite change and unexpected weight change.  HENT:  Negative for congestion, sinus pressure and sore throat.    Eyes:  Negative for pain and visual disturbance.  Respiratory:  Negative for cough, chest tightness and shortness of breath.   Cardiovascular:  Negative for chest pain and palpitations.  Gastrointestinal:  Negative for abdominal pain, diarrhea, nausea and vomiting.  Genitourinary:  Negative for difficulty urinating and dysuria.  Musculoskeletal:  Negative for joint swelling and myalgias.  Skin:  Negative for color change and rash.  Neurological:  Negative for dizziness and headaches.  Hematological:  Negative for adenopathy. Does not bruise/bleed easily.  Psychiatric/Behavioral:  Negative for agitation and dysphoric mood.        Increased stress as outlined.        Objective:     BP 110/70   Pulse 70   Temp 98 F (36.7 C)   Resp 16   Ht 5\' 10"  (1.778 m)   Wt 226 lb 3.2 oz (102.6 kg)   SpO2 98%   BMI 32.46 kg/m  Wt Readings from Last 3 Encounters:  07/22/23 226 lb 3.2 oz (102.6 kg)  06/12/23 223 lb 6.4 oz (101.3 kg)  05/27/23 224 lb (101.6 kg)    Physical Exam Vitals reviewed.  Constitutional:      General: She is not in acute distress.    Appearance: Normal appearance. She is well-developed.  HENT:     Head: Normocephalic and atraumatic.     Right Ear: External ear normal.     Left Ear: External ear normal.  Eyes:     General: No scleral icterus.       Right eye: No discharge.        Left eye: No discharge.     Conjunctiva/sclera: Conjunctivae normal.  Neck:     Thyroid: No thyromegaly.  Cardiovascular:     Rate and Rhythm: Normal rate and regular rhythm.  Pulmonary:     Effort: No tachypnea, accessory muscle usage or respiratory distress.     Breath sounds: Normal breath sounds. No decreased breath sounds or wheezing.  Chest:  Breasts:    Right: No inverted nipple, mass, nipple discharge or tenderness (no axillary adenopathy).     Left: No inverted nipple, mass, nipple discharge or tenderness (no axilarry adenopathy).  Abdominal:     General: Bowel sounds  are normal.     Palpations: Abdomen is soft.     Tenderness: There is no abdominal tenderness.  Genitourinary:    Comments: Normal external genitalia.  Vaginal vault without  lesions.  Cervix identified.  Pap smear performed.  Atrophy changes present. Could not appreciate any adnexal masses or tenderness.   Musculoskeletal:        General: No swelling or tenderness.     Cervical back: Neck supple.  Lymphadenopathy:     Cervical: No cervical adenopathy.  Skin:    Findings: No erythema or rash.  Neurological:     Mental Status: She is alert and oriented to person, place, and time.  Psychiatric:        Mood and Affect: Mood normal.        Behavior: Behavior normal.      Outpatient Encounter Medications as of 07/22/2023  Medication Sig   busPIRone (BUSPAR) 5 MG tablet Take 1 tablet (5 mg total) by mouth daily.   bisoprolol-hydrochlorothiazide (ZIAC) 2.5-6.25 MG tablet TAKE 1 TABLET BY MOUTH DAILY FOR HIGH BLOOD PRESSURE   cholecalciferol (VITAMIN D3) 25 MCG (1000 UNIT) tablet Take 1,000 Units by mouth daily.   citalopram (CELEXA) 40 MG tablet TAKE 1 TABLET BY MOUTH DAILY   clopidogrel (PLAVIX) 75 MG tablet TAKE 1 TABLET BY MOUTH DAILY   diphenoxylate-atropine (LOMOTIL) 2.5-0.025 MG tablet TAKE 1 TABLET BY MOUTH 4 TIMES DAILY AS NEEDED FOR DIARRHEA OR LOOSE STOOLS   gabapentin (NEURONTIN) 300 MG capsule TAKE 1 CAPSULE BY MOUTH AT BEDTIME   Loperamide HCl (IMODIUM PO) Take by mouth as needed.   meloxicam (MOBIC) 7.5 MG tablet Take 1 tablet (7.5 mg total) by mouth 2 (two) times daily.   Misc Natural Products (ALLERGY RELEAF SYSTEM PO) Take by mouth. Reported on 03/20/2016   nystatin (MYCOSTATIN/NYSTOP) powder Apply 1 Application topically 2 (two) times daily.   nystatin cream (MYCOSTATIN) Apply 1 application topically 2 (two) times daily.   rosuvastatin (CRESTOR) 10 MG tablet Take 1 tablet (10 mg total) by mouth daily. Increased from 5 mg.   tiZANidine (ZANAFLEX) 4 MG tablet TAKE 1 TABLET  BY MOUTH AT BEDTIME AS NEEDED FOR MUSCLE SPASMS   torsemide (DEMADEX) 20 MG tablet TAKE 1 TABLET BY MOUTH EVERY DAY AS NEEDED   vitamin B-12 (CYANOCOBALAMIN) 250 MCG tablet Take 250 mcg by mouth daily.   No facility-administered encounter medications on file as of 07/22/2023.     Lab Results  Component Value Date   WBC 4.6 06/13/2023   HGB 12.5 06/13/2023   HCT 36.8 06/13/2023   PLT 134 (L) 06/13/2023   GLUCOSE 102 (H) 06/14/2023   CHOL 119 06/13/2023   TRIG 109 06/13/2023   HDL 41 06/13/2023   LDLCALC 56 06/13/2023   ALT 13 06/12/2023   AST 19 06/12/2023   NA 138 06/14/2023   K 3.9 06/14/2023   CL 102 06/14/2023   CREATININE 1.01 (H) 06/14/2023   BUN 16 06/14/2023   CO2 26 06/14/2023   TSH 1.55 10/16/2022   INR 0.9 06/12/2023   HGBA1C 5.6 06/13/2023    CT HEAD WO CONTRAST ( )  Result Date: 06/14/2023 CLINICAL DATA:  67 year old female code stroke presentation on 06/12/2023. Small acute right thalamic infarct on MRI. EXAM: CT HEAD WITHOUT CONTRAST TECHNIQUE: Contiguous axial images were obtained from the base of the skull through the vertex without intravenous contrast. RADIATION DOSE REDUCTION: This exam was performed according to the departmental dose-optimization program which includes automated exposure control, adjustment of the mA and/or kV according to patient size and/or use of iterative reconstruction technique. COMPARISON:  Brain MRI 06/13/2023 and earlier. FINDINGS: Brain: Minimal linear hyperdensity now at the right thalamus corresponding to  abnormal diffusion on 06/13/2023. No hemorrhage or mass effect. Superimposed chronic left corona radiata, internal capsule and lentiform lacunar infarct. No midline shift, ventriculomegaly, mass effect, evidence of mass lesion, intracranial hemorrhage or evidence of cortically based acute infarction. Vascular: No suspicious intracranial vascular hyperdensity. Skull: Intact, negative. Sinuses/Orbits: Visualized paranasal sinuses and  mastoids are stable and well aerated. Other: Visualized orbits and scalp soft tissues are within normal limits. IMPRESSION: 1. Known small right thalamic infarct is subtle by CT. No hemorrhage or mass effect. 2. No new intracranial abnormality. Stable chronic left hemisphere small vessel ischemia. Electronically Signed   By: Odessa Fleming M.D.   On: 06/14/2023 11:29   ECHOCARDIOGRAM COMPLETE  Result Date: 06/13/2023    ECHOCARDIOGRAM REPORT   Patient Name:   Jacqueline Nichols Date of Exam: 06/13/2023 Medical Rec #:  [id number]       Height:       70.0 in Accession #:    [id number]      Weight:       223.4 lb Date of Birth:  11-19-1955        BSA:          2.188 m Patient Age:    67 years        BP:           130/75 mmHg Patient Gender: F               HR:           43 bpm. Exam Location:  [facility] Procedure: 2D Echo, Cardiac Doppler and Color Doppler Indications:     Stroke  History:         Patient has no prior history of Echocardiogram examinations.                  Stroke, Arrythmias:Tachycardia and Bradycardia,                  Signs/Symptoms:Fatigue and Syncope; Risk Factors:Hypertension                  and Diabetes.  Sonographer:     Mikki Harbor Referring Phys:  [id number] LESLIE REYNOLDS Diagnosing Phys: Debbe Odea MD IMPRESSIONS  1. Left ventricular ejection fraction, by estimation, is 65 to 70%. The left ventricle has normal function. The left ventricle has no regional wall motion abnormalities. Left ventricular diastolic parameters were normal.  2. Right ventricular systolic function is normal. The right ventricular size is normal. There is normal pulmonary artery systolic pressure.  3. Left atrial size was mildly dilated.  4. The mitral valve is normal in structure. Mild mitral valve regurgitation.  5. The aortic valve is tricuspid. Aortic valve regurgitation is not visualized.  6. The inferior vena cava is normal in size with greater than 50% respiratory variability, suggesting right atrial pressure of 3  mmHg. FINDINGS  Left Ventricle: Left ventricular ejection fraction, by estimation, is 65 to 70%. The left ventricle has normal function. The left ventricle has no regional wall motion abnormalities. The left ventricular internal cavity size was normal in size. There is  no left ventricular hypertrophy. Left ventricular diastolic parameters were normal. Right Ventricle: The right ventricular size is normal. No increase in right ventricular wall thickness. Right ventricular systolic function is normal. There is normal pulmonary artery systolic pressure. The tricuspid regurgitant velocity is 2.59 m/s, and  with an assumed right atrial pressure of 3 mmHg, the estimated right ventricular systolic pressure is 29.8 mmHg. Left Atrium: Left  atrial size was mildly dilated. Right Atrium: Right atrial size was normal in size. Pericardium: There is no evidence of pericardial effusion. Mitral Valve: The mitral valve is normal in structure. Mild mitral valve regurgitation. MV peak gradient, 5.5 mmHg. The mean mitral valve gradient is 1.0 mmHg. Tricuspid Valve: The tricuspid valve is normal in structure. Tricuspid valve regurgitation is mild. Aortic Valve: The aortic valve is tricuspid. Aortic valve regurgitation is not visualized. Aortic valve mean gradient measures 4.0 mmHg. Aortic valve peak gradient measures 8.5 mmHg. Aortic valve area, by VTI measures 2.45 cm. Pulmonic Valve: The pulmonic valve was normal in structure. Pulmonic valve regurgitation is not visualized. Aorta: The aortic root is normal in size and structure. Venous: The inferior vena cava is normal in size with greater than 50% respiratory variability, suggesting right atrial pressure of 3 mmHg. IAS/Shunts: No atrial level shunt detected by color flow Doppler.  LEFT VENTRICLE PLAX 2D LVIDd:         4.40 cm   Diastology LVIDs:         2.20 cm   LV e' medial:    7.94 cm/s LV PW:         1.00 cm   LV E/e' medial:  12.2 LV IVS:        1.20 cm   LV e' lateral:   9.79  cm/s LVOT diam:     1.90 cm   LV E/e' lateral: 9.9 LV SV:         95 LV SV Index:   43 LVOT Area:     2.84 cm  RIGHT VENTRICLE RV Basal diam:  3.70 cm RV Mid diam:    3.50 cm RV S prime:     11.40 cm/s LEFT ATRIUM             Index        RIGHT ATRIUM           Index LA diam:        3.80 cm 1.74 cm/m   RA Area:     19.60 cm LA Vol (A2C):   95.5 ml 43.65 ml/m  RA Volume:   58.60 ml  26.78 ml/m LA Vol (A4C):   77.8 ml 35.56 ml/m LA Biplane Vol: 87.2 ml 39.86 ml/m  AORTIC VALVE                    PULMONIC VALVE AV Area (Vmax):    2.43 cm     PV Vmax:       1.11 m/s AV Area (Vmean):   2.54 cm     PV Peak grad:  4.9 mmHg AV Area (VTI):     2.45 cm AV Vmax:           146.00 cm/s AV Vmean:          91.000 cm/s AV VTI:            0.387 m AV Peak Grad:      8.5 mmHg AV Mean Grad:      4.0 mmHg LVOT Vmax:         125.00 cm/s LVOT Vmean:        81.600 cm/s LVOT VTI:          0.335 m LVOT/AV VTI ratio: 0.87  AORTA Ao Root diam: 3.20 cm MITRAL VALVE                TRICUSPID VALVE MV Area (PHT): 2.83 cm  TR Peak grad:   26.8 mmHg MV Area VTI:   2.05 cm     TR Vmax:        259.00 cm/s MV Peak grad:  5.5 mmHg MV Mean grad:  1.0 mmHg     SHUNTS MV Vmax:       1.17 m/s     Systemic VTI:  0.34 m MV Vmean:      47.7 cm/s    Systemic Diam: 1.90 cm MV Decel Time: 268 msec MV E velocity: 96.90 cm/s MV A velocity: 112.00 cm/s MV E/A ratio:  0.87 Debbe Odea MD Electronically signed by Debbe Odea MD Signature Date/Time: 06/13/2023/3:51:46 PM    Final    MR BRAIN WO CONTRAST  Result Date: 06/13/2023 EXAM: MRI HEAD WITHOUT CONTRAST TECHNIQUE: Multiplanar, multiecho pulse sequences of the brain and surrounding structures were obtained without intravenous contrast. COMPARISON:  MRI head December 19, 2004. FINDINGS: Brain: Small acute or subacute right thalamic infarct. Remote perforator infarct in left basal ganglia. Additional scattered T2/FLAIR hyperintensities in white matter compatible with chronic  microvascular ischemic disease. No acute hemorrhage, mass lesion, midline shift or hydrocephalus. Vascular: Major arterial flow voids are maintained Skull and upper cervical spine: Normal marrow signal. Sinuses/Orbits: Clear sinuses.  No acute orbital findings. Other: No mastoid effusions. IMPRESSION: 1. Small acute or subacute right thalamic infarct. 2. Remote perforator infarct in left basal ganglia and chronic microvascular ischemic disease. Electronically Signed   By: Feliberto Harts M.D.   On: 06/13/2023 15:41       Assessment & Plan:  Routine general medical examination at a health care facility  Screening for cervical cancer -     Cytology - PAP  Need for influenza vaccination -     Flu Vaccine Trivalent High Dose (Fluad)  SVT (supraventricular tachycardia) (HCC) Assessment & Plan: Stable on ziac.    Personal history of renal cell carcinoma Assessment & Plan: Dr Cathie Hoops 09/26/22 - recommend f/u with oncology in one year.    Primary hypertension Assessment & Plan: Continue on torsemide and ziac.  Blood pressure as outlined.  Follow pressures.  Follow metabolic panel.    Hyperglycemia Assessment & Plan: Recent A1c 5.6.  continue low carb diet and exercise.    Hypercholesteremia Assessment & Plan: Continue crestor. Low cholesterol diet and exercise.  Follow lipid panel and liver function tests.    History of renal cell carcinoma Assessment & Plan: S/p left radical nephrectomy and partial adrenalectomy.  Seeing Dr Cathie Hoops.  Stable.  Recommended f/u in one year.    Health care maintenance Assessment & Plan: Physical today 07/22/23.  Colonoscopy 2017.  Mammogram 08/16/22- Birads I.    Type 2 diabetes mellitus with hyperglycemia, without long-term current use of insulin (HCC) Assessment & Plan: Low carb diet and exercise.  Follow met b and a1c. Recent A1c 5.6.    Depression, major, single episode, mild (HCC) Assessment & Plan: On citalopram. Was on Trintilex. Felt was  helping.  Not covered by insurance.  Attempted to get assistance.  Discussed referral to psychiatry to help with medication management.  Will let me know if desires referral.  Has tried prozac - not help.  Has been on wellbutrin previously.  Does not report adverse effect from wellbutrin.  Continues on citalopram. Trial of buspar.  Follow.  Avoid benzodiazepines.    Aortic atherosclerosis (HCC) Assessment & Plan: Continue crestor.    Hepatomegaly Assessment & Plan: Saw Dr Servando Snare 05/2023.  Abdominal ultrasound - no liver enlargement documented.  Felt no further w/up warranted.     Other orders -     busPIRone HCl; Take 1 tablet (5 mg total) by mouth daily.  Dispense: 30 tablet; Refill: 1     Dale Minor Hill, MD

## 2023-07-24 LAB — CYTOLOGY - PAP
Comment: NEGATIVE
Diagnosis: NEGATIVE
High risk HPV: NEGATIVE

## 2023-07-27 ENCOUNTER — Encounter: Payer: Self-pay | Admitting: Internal Medicine

## 2023-07-27 NOTE — Assessment & Plan Note (Signed)
Continue on torsemide and ziac.  Blood pressure as outlined.  Follow pressures.  Follow metabolic panel.

## 2023-07-27 NOTE — Assessment & Plan Note (Signed)
Dr Cathie Hoops 09/26/22 - recommend f/u with oncology in one year.

## 2023-07-27 NOTE — Assessment & Plan Note (Signed)
On citalopram. Was on Trintilex. Felt was helping.  Not covered by insurance.  Attempted to get assistance.  Discussed referral to psychiatry to help with medication management.  Will let me know if desires referral.  Has tried prozac - not help.  Has been on wellbutrin previously.  Does not report adverse effect from wellbutrin.  Continues on citalopram. Trial of buspar.  Follow.  Avoid benzodiazepines.

## 2023-07-27 NOTE — Assessment & Plan Note (Signed)
Physical today 07/22/23.  Colonoscopy 2017.  Mammogram 08/16/22- Birads I.

## 2023-07-27 NOTE — Assessment & Plan Note (Signed)
Low carb diet and exercise.  Follow met b and a1c. Recent A1c 5.6.

## 2023-07-27 NOTE — Assessment & Plan Note (Signed)
Continue crestor.  Low cholesterol diet and exercise.  Follow lipid panel and liver function tests.  

## 2023-07-27 NOTE — Assessment & Plan Note (Signed)
Stable on ziac.

## 2023-07-27 NOTE — Assessment & Plan Note (Signed)
Recent A1c 5.6.  continue low carb diet and exercise.

## 2023-07-27 NOTE — Assessment & Plan Note (Signed)
Continue crestor

## 2023-07-27 NOTE — Assessment & Plan Note (Signed)
S/p left radical nephrectomy and partial adrenalectomy.  Seeing Dr Cathie Hoops.  Stable.  Recommended f/u in one year.

## 2023-07-27 NOTE — Assessment & Plan Note (Signed)
Saw Dr Servando Snare 05/2023.  Abdominal ultrasound - no liver enlargement documented. Felt no further w/up warranted.

## 2023-08-04 ENCOUNTER — Other Ambulatory Visit: Payer: Self-pay | Admitting: Internal Medicine

## 2023-08-15 ENCOUNTER — Telehealth: Payer: Self-pay | Admitting: Internal Medicine

## 2023-08-15 DIAGNOSIS — H2513 Age-related nuclear cataract, bilateral: Secondary | ICD-10-CM | POA: Diagnosis not present

## 2023-08-15 DIAGNOSIS — H04123 Dry eye syndrome of bilateral lacrimal glands: Secondary | ICD-10-CM | POA: Diagnosis not present

## 2023-08-15 DIAGNOSIS — H40033 Anatomical narrow angle, bilateral: Secondary | ICD-10-CM | POA: Diagnosis not present

## 2023-08-15 DIAGNOSIS — H25013 Cortical age-related cataract, bilateral: Secondary | ICD-10-CM | POA: Diagnosis not present

## 2023-08-15 NOTE — Telephone Encounter (Signed)
Pt called stating she need a stronger dose of the busPIRone

## 2023-08-18 ENCOUNTER — Telehealth (INDEPENDENT_AMBULATORY_CARE_PROVIDER_SITE_OTHER): Payer: Medicare HMO | Admitting: Internal Medicine

## 2023-08-18 ENCOUNTER — Encounter: Payer: Self-pay | Admitting: Internal Medicine

## 2023-08-18 VITALS — Ht 70.0 in | Wt 226.2 lb

## 2023-08-18 DIAGNOSIS — F320 Major depressive disorder, single episode, mild: Secondary | ICD-10-CM | POA: Diagnosis not present

## 2023-08-18 DIAGNOSIS — I10 Essential (primary) hypertension: Secondary | ICD-10-CM

## 2023-08-18 MED ORDER — BUSPIRONE HCL 5 MG PO TABS: 5.0000 mg | ORAL_TABLET | Freq: Two times a day (BID) | ORAL | 1 refills | Status: DC

## 2023-08-18 NOTE — Progress Notes (Signed)
Patient ID: Jacqueline Nichols, female   DOB: May 21, 1956, 67 y.o.   MRN: [id number]   Virtual Visit via video Note  I connected with [facility] by a video enabled telemedicine application and verified that I am speaking with the correct person using two identifiers. Location patient: home Location provider: work Persons participating in the virtual visit: patient, provider  The limitations, risks, security and privacy concerns of performing an evaluation and management service by video and the availability of in person appointments have been discussed. It has also been discussed with the patient that there may be a patient responsible charge related to this service. The patient expressed understanding and agreed to proceed.   Reason for visit: work in appt  HPI: Work in to discuss increased stress/anxiety. Last visit, described being more irritable. Increased stress. Felt needed something to help level things out. Taking citalopram. Was started on buspar. Taking 5mg  in am. Feels is helping. Can tell helping - first part of day. Was questioning if she needs to increase dose of buspar. Discussed buspar and dosing. Otherwise appears to be doing ok.     ROS: See pertinent positives and negatives per HPI.  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   Urinary incontinence     Past Surgical History:  Procedure Laterality Date   CHOLECYSTECTOMY  2001   COLONOSCOPY WITH PROPOFOL N/A 03/27/2016   Procedure: COLONOSCOPY WITH PROPOFOL;  Surgeon: Midge Minium, MD;  Location: [facility] ENDOSCOPY;  Service: Endoscopy;  Laterality: N/A;   COLONOSCOPY WITH PROPOFOL N/A 08/31/2021   Procedure: COLONOSCOPY WITH PROPOFOL;  Surgeon: Midge Minium, MD;  Location: [facility];   Service: Endoscopy;  Laterality: N/A;   KNEE SURGERY  2003-2009   s/p 8 knee surgeries (with 3 knee replacements)   NEPHRECTOMY RADICAL  07/2015   NISSEN FUNDOPLICATION      Family History  Problem Relation Age of Onset   Colon cancer Mother        died - age 72   Heart disease Father    Hypertension Father    Lymphoma Father    Arthritis/Rheumatoid Sister    Lung cancer Brother        smoked   Arthritis/Rheumatoid Paternal Grandmother    Cancer Maternal Uncle        Unknown type of cancer    Cancer Paternal Aunt        Unknown type of cancer    Throat cancer Paternal Uncle    Colon cancer Paternal Uncle    Breast cancer Neg Hx     SOCIAL HX: reviewed.    Current Outpatient Medications:    bisoprolol-hydrochlorothiazide (ZIAC) 2.5-6.25 MG tablet, TAKE 1 TABLET BY MOUTH DAILY FOR HIGH BLOOD PRESSURE, Disp: 90 tablet, Rfl: 3   busPIRone (BUSPAR) 5 MG tablet, Take 1 tablet (5 mg total) by mouth 2 (two) times daily., Disp: 60 tablet, Rfl: 1   cholecalciferol (VITAMIN D3) 25 MCG (1000 UNIT) tablet, Take 1,000 Units by mouth daily., Disp: , Rfl:    citalopram (CELEXA) 40 MG tablet, TAKE 1 TABLET BY MOUTH DAILY, Disp: 90 tablet, Rfl: 1   clopidogrel (PLAVIX) 75 MG tablet, TAKE  1 TABLET BY MOUTH DAILY, Disp: 90 tablet, Rfl: 3   diphenoxylate-atropine (LOMOTIL) 2.5-0.025 MG tablet, TAKE 1 TABLET BY MOUTH 4 TIMES DAILY AS NEEDED FOR DIARRHEA OR LOOSE STOOLS, Disp: 120 tablet, Rfl: 5   gabapentin (NEURONTIN) 300 MG capsule, TAKE 1 CAPSULE BY MOUTH AT BEDTIME, Disp: 30 capsule, Rfl: 2   Loperamide HCl (IMODIUM PO), Take by mouth as needed., Disp: , Rfl:    meloxicam (MOBIC) 7.5 MG tablet, Take 1 tablet (7.5 mg total) by mouth 2 (two) times daily., Disp: 60 tablet, Rfl: 5   Misc Natural Products (ALLERGY RELEAF SYSTEM PO), Take by mouth. Reported on 03/20/2016, Disp: , Rfl:    nystatin (MYCOSTATIN/NYSTOP) powder, Apply 1 Application topically 2 (two) times daily., Disp: 60 g, Rfl: 0    nystatin cream (MYCOSTATIN), Apply 1 application topically 2 (two) times daily., Disp: 30 g, Rfl: 0   rosuvastatin (CRESTOR) 10 MG tablet, Take 1 tablet (10 mg total) by mouth daily. Increased from 5 mg., Disp: 30 tablet, Rfl: 2   tiZANidine (ZANAFLEX) 4 MG tablet, TAKE 1 TABLET BY MOUTH AT BEDTIME AS NEEDED FOR MUSCLE SPASMS, Disp: 30 tablet, Rfl: 0   torsemide (DEMADEX) 20 MG tablet, TAKE 1 TABLET BY MOUTH EVERY DAY AS NEEDED, Disp: 90 tablet, Rfl: 1   vitamin B-12 (CYANOCOBALAMIN) 250 MCG tablet, Take 250 mcg by mouth daily., Disp: , Rfl:   EXAM:  GENERAL: alert, oriented, appears well and in no acute distress  HEENT: atraumatic, conjunttiva clear, no obvious abnormalities on inspection of external nose and ears  NECK: normal movements of the head and neck  LUNGS: on inspection no signs of respiratory distress, breathing rate appears normal, no obvious gross SOB, gasping or wheezing  CV: no obvious cyanosis  PSYCH/NEURO: pleasant and cooperative, no obvious depression or anxiety, speech and thought processing grossly intact  ASSESSMENT AND PLAN:  Discussed the following assessment and plan:  Problem List Items Addressed This Visit     Depression, major, single episode, mild (HCC) - Primary   On citalopram. Was on Trintilex. Felt was helping.  Not covered by insurance.  Attempted to get assistance.  Have previously discussed referral to psychiatry to help with medication management. Has tried prozac - not help.  Has been on wellbutrin previously.  Does not report adverse effect from wellbutrin.  Continues on citalopram. Wanted to avoid benzodiazepines. Last visit, started on buspar. Taking 5mg  q am. Feels is helping, but wears off by afternoon. Discussed dosing.  Would like to increase buspar to 5mg  bid. Follow.  Call with update.       Relevant Medications   busPIRone (BUSPAR) 5 MG tablet   Hypertension   Continue on torsemide and ziac.  Follow pressures.  Follow metabolic panel.         Return if symptoms worsen or fail to improve.   I discussed the assessment and treatment plan with the patient. The patient was provided an opportunity to ask questions and all were answered. The patient agreed with the plan and demonstrated an understanding of the instructions.   The patient was advised to call back or seek an in-person evaluation if the symptoms worsen or if the condition fails to improve as anticipated.    Dale [place], MD

## 2023-08-18 NOTE — Telephone Encounter (Signed)
She does feel like it is helping. Virtual scheduled.

## 2023-08-18 NOTE — Telephone Encounter (Signed)
We started her on buspar 5 mg q day 07/22/23. Are you ok with increasing or would you prefer to see her to discuss?

## 2023-08-18 NOTE — Telephone Encounter (Signed)
Does she feel the medication is helping, just dose not strong enough?  Let me know. Can see if agreeable for add on virtual visit.

## 2023-08-23 NOTE — Assessment & Plan Note (Signed)
Continue on torsemide and ziac.  Follow pressures.  Follow metabolic panel.

## 2023-08-23 NOTE — Assessment & Plan Note (Signed)
On citalopram. Was on Trintilex. Felt was helping.  Not covered by insurance.  Attempted to get assistance.  Have previously discussed referral to psychiatry to help with medication management. Has tried prozac - not help.  Has been on wellbutrin previously.  Does not report adverse effect from wellbutrin.  Continues on citalopram. Wanted to avoid benzodiazepines. Last visit, started on buspar. Taking 5mg  q am. Feels is helping, but wears off by afternoon. Discussed dosing.  Would like to increase buspar to 5mg  bid. Follow.  Call with update.

## 2023-09-05 ENCOUNTER — Other Ambulatory Visit: Payer: Self-pay | Admitting: Internal Medicine

## 2023-09-26 ENCOUNTER — Telehealth: Payer: Self-pay | Admitting: Gastroenterology

## 2023-09-26 NOTE — Telephone Encounter (Signed)
The patient called in to see what can she take for gas. Her pharmacy is [facility] on [address] [place], [place], [place] [address]. Please call the patient.

## 2023-09-29 ENCOUNTER — Inpatient Hospital Stay: Payer: Medicare Other

## 2023-09-29 ENCOUNTER — Encounter: Payer: Self-pay | Admitting: Oncology

## 2023-09-29 ENCOUNTER — Inpatient Hospital Stay: Payer: Medicare Other | Attending: Oncology | Admitting: Oncology

## 2023-09-29 VITALS — BP 142/69 | HR 56 | Temp 97.5°F | Resp 18 | Wt 229.8 lb

## 2023-09-29 DIAGNOSIS — Z85528 Personal history of other malignant neoplasm of kidney: Secondary | ICD-10-CM

## 2023-09-29 DIAGNOSIS — Z08 Encounter for follow-up examination after completed treatment for malignant neoplasm: Secondary | ICD-10-CM | POA: Diagnosis not present

## 2023-09-29 LAB — CBC WITH DIFFERENTIAL/PLATELET
Abs Immature Granulocytes: 0.02 10*3/uL (ref 0.00–0.07)
Basophils Absolute: 0 10*3/uL (ref 0.0–0.1)
Basophils Relative: 0 %
Eosinophils Absolute: 0.2 10*3/uL (ref 0.0–0.5)
Eosinophils Relative: 3 %
HCT: 38.8 % (ref 36.0–46.0)
Hemoglobin: 12.8 g/dL (ref 12.0–15.0)
Immature Granulocytes: 0 %
Lymphocytes Relative: 30 %
Lymphs Abs: 1.9 10*3/uL (ref 0.7–4.0)
MCH: 29.9 pg (ref 26.0–34.0)
MCHC: 33 g/dL (ref 30.0–36.0)
MCV: 90.7 fL (ref 80.0–100.0)
Monocytes Absolute: 0.4 10*3/uL (ref 0.1–1.0)
Monocytes Relative: 6 %
Neutro Abs: 3.8 10*3/uL (ref 1.7–7.7)
Neutrophils Relative %: 61 %
Platelets: 159 10*3/uL (ref 150–400)
RBC: 4.28 MIL/uL (ref 3.87–5.11)
RDW: 12.3 % (ref 11.5–15.5)
WBC: 6.3 10*3/uL (ref 4.0–10.5)
nRBC: 0 % (ref 0.0–0.2)

## 2023-09-29 LAB — COMPREHENSIVE METABOLIC PANEL
ALT: 15 U/L (ref 0–44)
AST: 17 U/L (ref 15–41)
Albumin: 4.2 g/dL (ref 3.5–5.0)
Alkaline Phosphatase: 68 U/L (ref 38–126)
Anion gap: 8 (ref 5–15)
BUN: 22 mg/dL (ref 8–23)
CO2: 25 mmol/L (ref 22–32)
Calcium: 9.1 mg/dL (ref 8.9–10.3)
Chloride: 102 mmol/L (ref 98–111)
Creatinine, Ser: 0.91 mg/dL (ref 0.44–1.00)
GFR, Estimated: >60 mL/min (ref >60)
Glucose, Bld: 99 mg/dL (ref 70–99)
Potassium: 4.3 mmol/L (ref 3.5–5.1)
Sodium: 135 mmol/L (ref 135–145)
Total Bilirubin: 0.8 mg/dL (ref 0.0–1.2)
Total Protein: 6.6 g/dL (ref 6.5–8.1)

## 2023-09-29 LAB — LACTATE DEHYDROGENASE: LDH: 145 U/L (ref 98–192)

## 2023-09-29 NOTE — Assessment & Plan Note (Addendum)
#   History of Stage II renal cell carcinoma, radical nephrectomy 07/24/2015  09/21/2020 CT showed no evidence of disease recurrence or metastatic disease. It has been slightly more than 8 years since her nephrectomy surgery. Clinically patient is doing very well. Labs reviewed and discussed with patient.  Stable LDH. Discussed about future images will be clinically indicated. Patient prefers to continue follow-up with oncology annually.  She will return in a year

## 2023-09-29 NOTE — Progress Notes (Signed)
Hematology/Oncology follow up note  [phone] [phone]  REASON FOR VISIT Follow up for renal cell carcinoma  ASSESSMENT & PLAN:   Personal history of renal cell carcinoma # History of Stage II renal cell carcinoma, radical nephrectomy 07/24/2015  09/21/2020 CT showed no evidence of disease recurrence or metastatic disease. It has been slightly more than 8 years since her nephrectomy surgery. Clinically patient is doing very well. Labs reviewed and discussed with patient.  Stable LDH. Discussed about future images will be clinically indicated. Patient prefers to continue follow-up with oncology annually.  She will return in a year  Orders Placed This Encounter  Procedures   CBC with Differential (Cancer Center Only)    Standing Status:   Future    Expected Date:   09/28/2024    Expiration Date:   09/28/2024   CMP (Cancer Center only)    Standing Status:   Future    Expected Date:   09/28/2024    Expiration Date:   09/28/2024   Lactate dehydrogenase    Standing Status:   Future    Expected Date:   09/28/2024    Expiration Date:   09/28/2024   Follow-up in 1 year All questions were answered. The patient knows to call the clinic with any problems, questions or concerns.  [name], MD, PhD [facility] Hematology Oncology 09/29/2023    HISTORY OF PRESENTING ILLNESS:  Jacqueline Nichols is a  68 y.o.  female with PMH listed below who was referred to me for evaluation of history of renal cell carcinoma Patient follows up with [facility] urology and want to switch her care to local. Extensive medical chart review was performed by me. History of Stage II left renal cell carcinoma.  In 2016, she was found to have a left renal mass 7.9 cm x 2.6 cm x 9.3 cm.  Underwent laparoscopic left radical nephrectomy with Dr.Matthew Reyna on 07/18/2015.  Per Note in care everywhere, patient has clear cell renal cell carcinoma, ISU P nuclear grade 2, pT2a, margins of resections are  negative for tumor, no metastatic carcinoma identified in 1 hilar lymph nodes (0/1), background kidney with scattered sclerotic glomeruli; histologically unremarkable adrenal gland.    Former smoker  INTERVAL HISTORY Jacqueline Nichols is a 68 y.o. female who has above history reviewed by me today presents for follow up visit for management of history of renal cell carcinoma  Patient reports feeling well.  Denies any unintentional weight loss, night sweats, fever, abdominal pain, blood in urine, or bone pain. She reports to have good appetite.   Review of Systems  Constitutional:  Negative for chills, fever, malaise/fatigue and weight loss.  HENT:  Negative for sore throat.   Eyes:  Negative for redness.  Respiratory:  Negative for cough, shortness of breath and wheezing.   Cardiovascular:  Negative for chest pain, palpitations and leg swelling.  Gastrointestinal:  Negative for abdominal pain, blood in stool, nausea and vomiting.  Genitourinary:  Negative for dysuria.  Musculoskeletal:  Negative for myalgias.  Skin:  Negative for rash.  Neurological:  Negative for dizziness, tingling and tremors.  Endo/Heme/Allergies:  Does not bruise/bleed easily.  Psychiatric/Behavioral:  Negative for hallucinations.     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   Urinary incontinence     SURGICAL HISTORY: Past Surgical History:  Procedure Laterality Date   CHOLECYSTECTOMY  2001   COLONOSCOPY WITH PROPOFOL N/A 03/27/2016   Procedure: COLONOSCOPY WITH PROPOFOL;  Surgeon: Midge Minium, MD;  Location: [facility] END[facility]COPY;  Service: Endoscopy;  Laterality: N/A;   COLONOSCOPY WITH PROPOFOL N/A 08/31/2021   Procedure: COLONOSCOPY WITH PROPOFOL;  Surgeon: Midge Minium, MD;  Location: [facility];  Service: Endoscopy;  Laterality: N/A;   KNEE SURGERY  2003-2009   s/p 8 knee surgeries (with 3 knee replacements)   NEPHRECTOMY RADICAL  07/2015   NISSEN FUNDOPLICATION      SOCIAL HISTORY: Social History   Socioeconomic History   Marital status: Divorced    Spouse name: Not on file   Number of children: 2   Years of education: Not on file   Highest education level: Not on file  Occupational History   Not on file  Tobacco Use   Smoking status: Former    Current packs/day: 0.00    Average packs/day: 1 pack/day for 15.0 years (15.0 ttl pk-yrs)    Types: Cigarettes    Start date: 10/23/1997    Quit date: 10/23/2012    Years since quitting: 10.9   Smokeless tobacco: Never   Tobacco comments:    Quit Feb 2014  Vaping Use   Vaping status: Former   Start date: 02/20/2013   Quit date: 02/20/2014  Substance and Sexual Activity   Alcohol use: Yes    Alcohol/week: 1.0 standard drink of alcohol    Types: 1 Glasses of wine per week    Comment: social drinking - approx 2-4 monthly    Drug use: No   Sexual activity: Not Currently  Other Topics Concern   Not on file  Social History Narrative   Not on file   Social Drivers of Health   Financial Resource Strain: Not on file  Food Insecurity: No Food Insecurity (06/12/2023)   Hunger Vital Sign    Worried About Running Out of Food in the Last Year: Never true    Ran Out of Food in the Last Year: Never true  Transportation Needs: No Transportation Needs (06/12/2023)   PRAPARE - Administrator, Civil Service (Medical): No    Lack of Transportation (Non-Medical): No  Physical Activity: Not on file  Stress: Not on file  Social Connections: Not on file  Intimate Partner Violence: Not At Risk (06/12/2023)   Humiliation, Afraid, Rape, and Kick questionnaire    Fear of Current or Ex-Partner: No    Emotionally Abused: No    Physically Abused: No    Sexually Abused: No    FAMILY  HISTORY: Family History  Problem Relation Age of Onset   Colon cancer Mother        died - age 80   Heart disease Father    Hypertension Father    Lymphoma Father    Arthritis/Rheumatoid Sister    Lung cancer Brother        smoked   Arthritis/Rheumatoid Paternal Grandmother    Cancer Maternal Uncle        Unknown type of cancer    Cancer Paternal Aunt        Unknown type of cancer    Throat cancer Paternal Uncle    Colon cancer Paternal Uncle    Breast cancer Neg Hx  ALLERGIES:  is allergic to codeine and vicodin [hydrocodone-acetaminophen].  MEDICATIONS:  Current Outpatient Medications  Medication Sig Dispense Refill   bisoprolol-hydrochlorothiazide (ZIAC) 2.5-6.25 MG tablet TAKE 1 TABLET BY MOUTH DAILY FOR HIGH BLOOD PRESSURE 90 tablet 3   busPIRone (BUSPAR) 5 MG tablet Take 1 tablet (5 mg total) by mouth 2 (two) times daily. 60 tablet 1   cholecalciferol (VITAMIN D3) 25 MCG (1000 UNIT) tablet Take 1,000 Units by mouth daily.     citalopram (CELEXA) 40 MG tablet TAKE 1 TABLET BY MOUTH DAILY 90 tablet 1   clopidogrel (PLAVIX) 75 MG tablet TAKE 1 TABLET BY MOUTH DAILY 90 tablet 3   diphenoxylate-atropine (LOMOTIL) 2.5-0.025 MG tablet TAKE 1 TABLET BY MOUTH 4 TIMES DAILY AS NEEDED FOR DIARRHEA OR LOOSE STOOLS 120 tablet 5   gabapentin (NEURONTIN) 300 MG capsule TAKE 1 CAPSULE BY MOUTH AT BEDTIME 30 capsule 2   Loperamide HCl (IMODIUM PO) Take by mouth as needed.     meloxicam (MOBIC) 7.5 MG tablet Take 1 tablet (7.5 mg total) by mouth 2 (two) times daily. 60 tablet 5   Misc Natural Products (ALLERGY RELEAF SYSTEM PO) Take by mouth. Reported on 03/20/2016     nystatin (MYCOSTATIN/NYSTOP) powder Apply 1 Application topically 2 (two) times daily. 60 g 0   nystatin cream (MYCOSTATIN) Apply 1 application topically 2 (two) times daily. 30 g 0   rosuvastatin (CRESTOR) 10 MG tablet Take 1 tablet (10 mg total) by mouth daily. Increased from 5 mg. 30 tablet 2   tiZANidine (ZANAFLEX) 4  MG tablet TAKE 1 TABLET BY MOUTH AT BEDTIME AS NEEDED FOR MUSCLE SPASMS 30 tablet 0   torsemide (DEMADEX) 20 MG tablet TAKE 1 TABLET BY MOUTH EVERY DAY AS NEEDED 90 tablet 1   vitamin B-12 (CYANOCOBALAMIN) 250 MCG tablet Take 250 mcg by mouth daily.     No current facility-administered medications for this visit.     PHYSICAL EXAMINATION: ECOG PERFORMANCE STATUS: 0 - Asymptomatic Vitals:   09/29/23 1340  BP: (!) 142/69  Pulse: (!) 56  Resp: 18  Temp: (!) 97.5 F (36.4 C)  SpO2: 96%   Filed Weights   09/29/23 1340  Weight: 229 lb 12.8 oz (104.2 kg)    Physical Exam Constitutional:      General: She is not in acute distress. HENT:     Head: Normocephalic and atraumatic.  Eyes:     General: No scleral icterus.    Conjunctiva/sclera: Conjunctivae normal.     Pupils: Pupils are equal, round, and reactive to light.  Cardiovascular:     Rate and Rhythm: Normal rate.  Pulmonary:     Effort: Pulmonary effort is normal. No respiratory distress.     Breath sounds: Normal breath sounds. No wheezing.  Abdominal:     General: Bowel sounds are normal. There is no distension.     Palpations: Abdomen is soft. There is no mass.     Tenderness: There is no abdominal tenderness.  Musculoskeletal:        General: No deformity. Normal range of motion.     Cervical back: Normal range of motion and neck supple.  Lymphadenopathy:     Cervical: No cervical adenopathy.  Skin:    General: Skin is warm and dry.     Findings: No erythema or rash.  Neurological:     Mental Status: She is alert and oriented to person, place, and time. Mental status is at baseline.     Cranial Nerves: No  cranial nerve deficit.     Coordination: Coordination normal.  Psychiatric:        Mood and Affect: Mood normal.      LABORATORY DATA:  I have reviewed the data as listed Lab Results  Component Value Date   WBC 6.3 09/29/2023   HGB 12.8 09/29/2023   HCT 38.8 09/29/2023   MCV 90.7 09/29/2023   PLT  159 09/29/2023   Recent Labs    01/17/23 1428 01/17/23 1428 06/12/23 1236 06/13/23 0327 06/14/23 0544 09/29/23 1329  NA 137   < > 133* 138 138 135  K 4.1  --  4.0 3.9 3.9 4.3  CL 99  --  101 102 102 102  CO2 21  --  24 27 26 25   GLUCOSE 82   < > 109* 95 102* 99  BUN 18   < > 15 14 16 22   CREATININE 1.00  --  0.84 1.02* 1.01* 0.91  CALCIUM 9.7  --  9.0 8.9 8.8* 9.1  GFRNONAA  --    < > >60 >60 >60 >60  PROT 6.4  --  6.6  --   --  6.6  ALBUMIN 4.4   < > 4.0 3.6 3.6 4.2  AST 20  --  19  --   --  17  ALT 16  --  13  --   --  15  ALKPHOS 90  --  73  --   --  68  BILITOT 0.7  --  0.7  --   --  0.8  BILIDIR 0.18  --   --   --   --   --    < > = values in this interval not displayed.

## 2023-09-30 ENCOUNTER — Encounter: Payer: Self-pay | Admitting: *Deleted

## 2023-10-06 ENCOUNTER — Other Ambulatory Visit: Payer: Self-pay | Admitting: Internal Medicine

## 2023-10-07 NOTE — Telephone Encounter (Signed)
Rx ok'd for tizanidine #30 with one refill.

## 2023-10-08 ENCOUNTER — Other Ambulatory Visit (INDEPENDENT_AMBULATORY_CARE_PROVIDER_SITE_OTHER): Payer: Medicare Other

## 2023-10-08 DIAGNOSIS — E559 Vitamin D deficiency, unspecified: Secondary | ICD-10-CM

## 2023-10-08 DIAGNOSIS — E7800 Pure hypercholesterolemia, unspecified: Secondary | ICD-10-CM

## 2023-10-08 DIAGNOSIS — I10 Essential (primary) hypertension: Secondary | ICD-10-CM | POA: Diagnosis not present

## 2023-10-08 DIAGNOSIS — E1165 Type 2 diabetes mellitus with hyperglycemia: Secondary | ICD-10-CM

## 2023-10-09 ENCOUNTER — Other Ambulatory Visit: Payer: Medicare HMO

## 2023-10-09 LAB — HEPATIC FUNCTION PANEL
ALT: 10 U/L (ref 0–35)
AST: 15 U/L (ref 0–37)
Albumin: 4.2 g/dL (ref 3.5–5.2)
Alkaline Phosphatase: 73 U/L (ref 39–117)
Bilirubin, Direct: 0.1 mg/dL (ref 0.0–0.3)
Total Bilirubin: 0.5 mg/dL (ref 0.2–1.2)
Total Protein: 6.3 g/dL (ref 6.0–8.3)

## 2023-10-09 LAB — BASIC METABOLIC PANEL
BUN: 21 mg/dL (ref 6–23)
CO2: 28 meq/L (ref 19–32)
Calcium: 9.3 mg/dL (ref 8.4–10.5)
Chloride: 102 meq/L (ref 96–112)
Creatinine, Ser: 1.02 mg/dL (ref 0.40–1.20)
GFR: 57.56 mL/min — ABNORMAL LOW (ref >60.00)
Glucose, Bld: 105 mg/dL — ABNORMAL HIGH (ref 70–99)
Potassium: 4.5 meq/L (ref 3.5–5.1)
Sodium: 138 meq/L (ref 135–145)

## 2023-10-09 LAB — LIPID PANEL
Cholesterol: 129 mg/dL (ref 0–200)
HDL: 50.1 mg/dL (ref >39.00)
LDL Cholesterol: 50 mg/dL (ref 0–99)
NonHDL: 78.9
Total CHOL/HDL Ratio: 3
Triglycerides: 147 mg/dL (ref 0.0–149.0)
VLDL: 29.4 mg/dL (ref 0.0–40.0)

## 2023-10-09 LAB — MICROALBUMIN / CREATININE URINE RATIO
Creatinine,U: 110.7 mg/dL
Microalb Creat Ratio: 0.6 mg/g (ref 0.0–30.0)
Microalb, Ur: <0.7 mg/dL (ref 0.0–1.9)

## 2023-10-09 LAB — HEMOGLOBIN A1C: Hgb A1c MFr Bld: 5.7 % (ref 4.6–6.5)

## 2023-10-09 LAB — TSH: TSH: 1.1 u[IU]/mL (ref 0.35–5.50)

## 2023-10-09 LAB — VITAMIN D 25 HYDROXY (VIT D DEFICIENCY, FRACTURES): VITD: 27.77 ng/mL — ABNORMAL LOW (ref 30.00–100.00)

## 2023-10-14 ENCOUNTER — Encounter: Payer: Medicare HMO | Admitting: Internal Medicine

## 2023-10-21 ENCOUNTER — Other Ambulatory Visit: Payer: Self-pay | Admitting: Internal Medicine

## 2023-10-24 DIAGNOSIS — M25551 Pain in right hip: Secondary | ICD-10-CM | POA: Diagnosis not present

## 2023-10-24 DIAGNOSIS — G8929 Other chronic pain: Secondary | ICD-10-CM | POA: Diagnosis not present

## 2023-11-05 ENCOUNTER — Other Ambulatory Visit: Payer: Self-pay | Admitting: Internal Medicine

## 2023-11-10 ENCOUNTER — Encounter: Payer: Self-pay | Admitting: Family Medicine

## 2023-11-10 ENCOUNTER — Telehealth: Payer: Self-pay

## 2023-11-10 ENCOUNTER — Ambulatory Visit (INDEPENDENT_AMBULATORY_CARE_PROVIDER_SITE_OTHER): Admitting: Family Medicine

## 2023-11-10 VITALS — BP 128/84 | HR 60 | Temp 97.7°F | Ht 70.0 in | Wt 232.2 lb

## 2023-11-10 DIAGNOSIS — N949 Unspecified condition associated with female genital organs and menstrual cycle: Secondary | ICD-10-CM | POA: Diagnosis not present

## 2023-11-10 DIAGNOSIS — N760 Acute vaginitis: Secondary | ICD-10-CM

## 2023-11-10 MED ORDER — CLOBETASOL PROPIONATE 0.05 % EX OINT: 1.0000 | TOPICAL_OINTMENT | Freq: Two times a day (BID) | CUTANEOUS | 0 refills | Status: AC

## 2023-11-10 MED ORDER — REPHRESH VA GEL
1.0000 | VAGINAL | 12 refills | Status: AC
Start: 2023-11-10 — End: ?

## 2023-11-10 NOTE — Telephone Encounter (Signed)
 Copied from CRM [id number]. Topic: Clinical - Prescription Issue >> Nov 10, 2023  4:27 PM [place] J wrote: Reason for CRM: [facility] (pharmacy) calling in regarding medication: Vaginal Lubricant (REPHRESH) GEL  This medication only comes as an odor reducing gel now, did provider just want a lubricant insert? (Replens)  Callback Number: [phone]

## 2023-11-10 NOTE — Patient Instructions (Addendum)
 It was a pleasure meeting you today. Thank you for allowing me to take part in your health care.  Our goals for today as we discussed include:  Use Rephresh vaginal gel 1 application every 3 days   KY moisturizer regularly and with sexual intercourse  Use clobetasol cream two times a day for 5 days for itching  Follow up if no improvement   This is a list of the screening recommended for you and due dates:  Health Maintenance  Topic Date Due   Medicare Annual Wellness Visit  Never done   Eye exam for diabetics  Never done   Zoster (Shingles) Vaccine (1 of 2) Never done   COVID-19 Vaccine (3 - Moderna risk series) 03/15/2020   DEXA scan (bone density measurement)  Never done   Complete foot exam   06/29/2023   Mammogram  08/17/2023   Pneumonia Vaccine (2 of 2 - PCV) 04/17/2024*   Hemoglobin A1C  04/06/2024   Yearly kidney function blood test for diabetes  10/07/2024   Yearly kidney health urinalysis for diabetes  10/07/2024   Colon Cancer Screening  09/01/2031   Flu Shot  Completed   Hepatitis C Screening  Completed   HPV Vaccine  Aged Out   DTaP/Tdap/Td vaccine  Discontinued  *Topic was postponed. The date shown is not the original due date.    If you have any questions or concerns, please do not hesitate to call the office at [phone].  I look forward to our next visit and until then take care and stay safe.  Regards,   Dana Allan, MD   [facility]

## 2023-11-10 NOTE — Progress Notes (Unsigned)
 SUBJECTIVE:   Chief Complaint  Patient presents with   Vaginal Itching    At top of vagina for past 6 weeks   HPI Presents for acute visit  Discussed the use of AI scribe software for clinical note transcription with the patient, who gave verbal consent to proceed.  History of Present Illness The patient is a 68 year old who presents with itching in the genital area.  She has been experiencing intense itching in the genital area, specifically at the top, for approximately six weeks. The itching is severe, with a sensation of 'something bubbling out' without any visible discharge. No changes in underwear type, detergent, or body wash have been made, and she continues to use a cotton crotch underwear. She uses Summer's Eve female wash and has not changed this routine. She has been using LoniStat cream after showers, which provides temporary relief, and has also used hydrocortisone cream a couple of times due to severe itching.  She is sexually active with a female partner of four years and does not use condoms. She reports discomfort during intercourse due to her partner's size and feels 'drier down that way.' She does not use lubrication, as her partner does not prefer it.  She recalls being prediabetic when she weighed 280 pounds but denies any current diagnosis of diabetes or changes in her medications. She does not monitor her blood glucose at home.      PERTINENT PMH / PSH: As above  OBJECTIVE:  BP 128/84   Pulse 60   Temp 97.7 F (36.5 C) (Oral)   Ht 5\' 10"  (1.778 m)   Wt 232 lb 3.2 oz (105.3 kg)   SpO2 99%   BMI 33.32 kg/m    Physical Exam Vitals reviewed. Exam conducted with a chaperone present.  Constitutional:      General: She is not in acute distress.    Appearance: Normal appearance. She is normal weight. She is not ill-appearing, toxic-appearing or diaphoretic.  Eyes:     General:        Right eye: No discharge.        Left eye: No discharge.      Conjunctiva/sclera: Conjunctivae normal.  Cardiovascular:     Rate and Rhythm: Normal rate.  Pulmonary:     Effort: Pulmonary effort is normal.  Genitourinary:    Exam position: Lithotomy position.     Pubic Area: No rash.      Labia:        Right: No rash.        Left: No rash.      Comments: Erythema and thinning of labia majora Musculoskeletal:        General: Normal range of motion.  Skin:    General: Skin is warm and dry.  Neurological:     General: No focal deficit present.     Mental Status: She is alert and oriented to person, place, and time. Mental status is at baseline.  Psychiatric:        Mood and Affect: Mood normal.        Behavior: Behavior normal.        Thought Content: Thought content normal.        Judgment: Judgment normal.           08/18/2023    4:27 PM 07/22/2023    1:49 PM 04/18/2023    2:04 PM 01/17/2023    1:43 PM 08/09/2022    2:29 PM  Depression screen PHQ 2/9  Decreased Interest 0 2 1 1 1   Down, Depressed, Hopeless 1 1 1 1 1   PHQ - 2 Score 1 3 2 2 2   Altered sleeping 0 3 1 0 0  Tired, decreased energy 0 3 0 3 1  Change in appetite 1 3 2 3  0  Feeling bad or failure about yourself  0 3 0 3 0  Trouble concentrating 0 3 3 3  0  Moving slowly or fidgety/restless 0 0 0 0 0  Suicidal thoughts 0 0 0 0 0  PHQ-9 Score 2 18 8 14 3   Difficult doing work/chores Not difficult at all Somewhat difficult Somewhat difficult Somewhat difficult Somewhat difficult      08/18/2023    4:28 PM  GAD 7 : Generalized Anxiety Score  Nervous, Anxious, on Edge 1  Control/stop worrying 1  Worry too much - different things 0  Trouble relaxing 1  Restless 0  Easily annoyed or irritable 1  Afraid - awful might happen 1  Total GAD 7 Score 5  Anxiety Difficulty Not difficult at all    ASSESSMENT/PLAN:  Vaginal discomfort Assessment & Plan: Severe itching with skin thinning, exacerbated by soaps and detergents. Temporary relief with Nystatin cream. -  Discontinue soaps and detergents in vulvar area. - Use KY Jelly or similar for moisturizing and itch relief. - Avoid underwear at night to allow area to breathe. - Clobetasol cream bid x 5 days - Monitor symptoms and report if no improvement in 1-2 weeks.  Orders: -     RepHresh; Place 1 applicator vaginally every 3 (three) days.  Dispense: 8 g; Refill: 12 -     Clobetasol Propionate; Apply 1 Application topically 2 (two) times daily. For 5 days  Dispense: 60 g; Refill: 0     PDMP reviewed  Return if symptoms worsen or fail to improve, for PCP.  Dana Allan, MD

## 2023-11-11 NOTE — Telephone Encounter (Signed)
 It appears she saw Dr Clent Ridges 11/10/23.  I think this is something she ordered. Please clarify with her what she ordered. Let me know if I need to do anything more.

## 2023-11-11 NOTE — Telephone Encounter (Signed)
 Pharmacy stated they would explain that to her.

## 2023-11-14 ENCOUNTER — Encounter: Payer: Self-pay | Admitting: Family Medicine

## 2023-11-14 NOTE — Assessment & Plan Note (Signed)
 Severe itching with skin thinning, exacerbated by soaps and detergents. Temporary relief with Nystatin cream. - Discontinue soaps and detergents in vulvar area. - Use KY Jelly or similar for moisturizing and itch relief. - Avoid underwear at night to allow area to breathe. - Clobetasol cream bid x 5 days - Monitor symptoms and report if no improvement in 1-2 weeks.

## 2023-11-25 ENCOUNTER — Other Ambulatory Visit: Payer: Self-pay | Admitting: Internal Medicine

## 2023-11-28 DIAGNOSIS — M5416 Radiculopathy, lumbar region: Secondary | ICD-10-CM | POA: Diagnosis not present

## 2023-11-28 DIAGNOSIS — M7061 Trochanteric bursitis, right hip: Secondary | ICD-10-CM | POA: Diagnosis not present

## 2023-11-28 DIAGNOSIS — M47816 Spondylosis without myelopathy or radiculopathy, lumbar region: Secondary | ICD-10-CM | POA: Diagnosis not present

## 2023-12-02 DIAGNOSIS — H25043 Posterior subcapsular polar age-related cataract, bilateral: Secondary | ICD-10-CM | POA: Diagnosis not present

## 2023-12-02 DIAGNOSIS — H18413 Arcus senilis, bilateral: Secondary | ICD-10-CM | POA: Diagnosis not present

## 2023-12-02 DIAGNOSIS — H2511 Age-related nuclear cataract, right eye: Secondary | ICD-10-CM | POA: Diagnosis not present

## 2023-12-02 DIAGNOSIS — H25013 Cortical age-related cataract, bilateral: Secondary | ICD-10-CM | POA: Diagnosis not present

## 2023-12-02 DIAGNOSIS — H2513 Age-related nuclear cataract, bilateral: Secondary | ICD-10-CM | POA: Diagnosis not present

## 2023-12-08 ENCOUNTER — Other Ambulatory Visit: Payer: Self-pay | Admitting: Internal Medicine

## 2023-12-08 NOTE — Telephone Encounter (Signed)
 Rx ok'd for gabapentin #30 with one refill. Tizanidine #30 with no refills.

## 2024-01-05 ENCOUNTER — Other Ambulatory Visit: Payer: Self-pay | Admitting: Internal Medicine

## 2024-02-06 DIAGNOSIS — M5416 Radiculopathy, lumbar region: Secondary | ICD-10-CM | POA: Diagnosis not present

## 2024-02-06 DIAGNOSIS — M7061 Trochanteric bursitis, right hip: Secondary | ICD-10-CM | POA: Diagnosis not present

## 2024-02-06 DIAGNOSIS — M47816 Spondylosis without myelopathy or radiculopathy, lumbar region: Secondary | ICD-10-CM | POA: Diagnosis not present

## 2024-02-10 ENCOUNTER — Ambulatory Visit (INDEPENDENT_AMBULATORY_CARE_PROVIDER_SITE_OTHER): Admitting: Internal Medicine

## 2024-02-10 VITALS — BP 126/70 | HR 63 | Temp 98.0°F | Resp 16 | Ht 70.0 in | Wt 233.0 lb

## 2024-02-10 DIAGNOSIS — I10 Essential (primary) hypertension: Secondary | ICD-10-CM | POA: Diagnosis not present

## 2024-02-10 DIAGNOSIS — R160 Hepatomegaly, not elsewhere classified: Secondary | ICD-10-CM

## 2024-02-10 DIAGNOSIS — Z1231 Encounter for screening mammogram for malignant neoplasm of breast: Secondary | ICD-10-CM

## 2024-02-10 DIAGNOSIS — E1165 Type 2 diabetes mellitus with hyperglycemia: Secondary | ICD-10-CM

## 2024-02-10 DIAGNOSIS — R739 Hyperglycemia, unspecified: Secondary | ICD-10-CM

## 2024-02-10 DIAGNOSIS — I700 Atherosclerosis of aorta: Secondary | ICD-10-CM

## 2024-02-10 DIAGNOSIS — R5383 Other fatigue: Secondary | ICD-10-CM

## 2024-02-10 DIAGNOSIS — E7800 Pure hypercholesterolemia, unspecified: Secondary | ICD-10-CM | POA: Diagnosis not present

## 2024-02-10 DIAGNOSIS — F320 Major depressive disorder, single episode, mild: Secondary | ICD-10-CM

## 2024-02-10 DIAGNOSIS — I471 Supraventricular tachycardia, unspecified: Secondary | ICD-10-CM

## 2024-02-10 DIAGNOSIS — Z85528 Personal history of other malignant neoplasm of kidney: Secondary | ICD-10-CM

## 2024-02-10 LAB — HM DIABETES FOOT EXAM

## 2024-02-10 NOTE — Progress Notes (Signed)
 Subjective:    Patient ID: Jacqueline Nichols, female    DOB: Feb 15, 1956, 68 y.o.   MRN: [id number]  Patient here for  Chief Complaint  Patient presents with   Medical Management of Chronic Issues    HPI Here for a scheduled follow up - follow up regarding diabetes, hypercholesterolemia, hypertension and increased stress. Continues on citalopram  and buspar . Had f/u with Dr Wilhelmenia Harada - 09/29/23 - f/u [facility]. Stable. Recommended f/u in one year. Saw Dr Hooten 10/2023 - right hip pain - right greater trochanteric bursitis. S/p injection. Persistent issues - f/u 02/06/24 - repeat injection. Increased stress. Discussed. Stress with her current relationship. Discussed counseling. Discussed referral to psychiatry. Increased fatigue.    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   Urinary incontinence    Past Surgical History:  Procedure Laterality Date   CHOLECYSTECTOMY  2001   COLONOSCOPY WITH PROPOFOL  N/A 03/27/2016   Procedure: COLONOSCOPY WITH PROPOFOL ;  Surgeon: Marnee Sink, MD;  Location: [facility] ENDOSCOPY;  Service: Endoscopy;  Laterality: N/A;   COLONOSCOPY WITH PROPOFOL  N/A 08/31/2021   Procedure: COLONOSCOPY WITH PROPOFOL ;  Surgeon: Marnee Sink, MD;  Location: [facility];  Service: Endoscopy;  Laterality: N/A;   KNEE SURGERY  2003-2009   s/p 8 knee surgeries (with 3 knee replacements)   NEPHRECTOMY RADICAL  07/2015   NISSEN FUNDOPLICATION     Family History  Problem Relation Age of Onset   Colon cancer Mother        died - age 13   Heart disease Father    Hypertension Father    Lymphoma Father    Arthritis/Rheumatoid Sister    Lung cancer Brother        smoked   Arthritis/Rheumatoid Paternal Grandmother    Cancer Maternal Uncle        Unknown type of  cancer    Cancer Paternal Aunt        Unknown type of cancer    Throat cancer Paternal Uncle    Colon cancer Paternal Uncle    Breast cancer Neg Hx    Social History   Socioeconomic History   Marital status: Divorced    Spouse name: Not on file   Number of children: 2   Years of education: Not on file   Highest education level: Not on file  Occupational History   Not on file  Tobacco Use   Smoking status: Former    Current packs/day: 0.00    Average packs/day: 1 pack/day for 15.0 years (15.0 ttl pk-yrs)    Types: Cigarettes    Start date: 10/23/1997    Quit date: 10/23/2012    Years since quitting: 11.3   Smokeless tobacco: Never   Tobacco comments:    Quit Feb 2014  Vaping Use   Vaping status: Former   Start date: 02/20/2013   Quit date: 02/20/2014  Substance and Sexual Activity   Alcohol use: Yes    Alcohol/week: 1.0 standard drink of alcohol    Types: 1 Glasses of wine per week    Comment: social drinking - approx 2-4 monthly    Drug use: No   Sexual activity: Not Currently  Other Topics Concern   Not on file  Social History Narrative   Not on file   Social Drivers of Health   Financial Resource Strain: Low Risk  (10/24/2023)   Received from [facility]   Overall Financial Resource Strain (CARDIA)    Difficulty of Paying Living Expenses: Not hard at all  Food Insecurity: No Food Insecurity (10/24/2023)   Received from [facility]   Hunger Vital Sign    Within the past 12 months, you worried that your food would run out before you got the money to buy more.: Never true    Within the past 12 months, the food you bought just didn't last and you didn't have money to get more.: Never true  Transportation Needs: No Transportation Needs (10/24/2023)   Received from [facility] - Transportation    In the past 12 months, has lack of transportation kept you from medical appointments or from getting  medications?: No    Lack of Transportation (Non-Medical): No  Physical Activity: Not on file  Stress: Not on file  Social Connections: Not on file     Review of Systems  Constitutional:  Positive for fatigue. Negative for unexpected weight change.  HENT:  Negative for congestion and sinus pressure.   Respiratory:  Negative for cough, chest tightness and shortness of breath.   Cardiovascular:  Negative for chest pain, palpitations and leg swelling.  Gastrointestinal:  Negative for abdominal pain, diarrhea, nausea and vomiting.  Genitourinary:  Negative for difficulty urinating and dysuria.  Musculoskeletal:  Negative for joint swelling and myalgias.       Right hip pain.   Skin:  Negative for color change and rash.  Neurological:  Negative for dizziness and headaches.  Psychiatric/Behavioral:  Negative for agitation and dysphoric mood.        Objective:     BP 126/70   Pulse 63   Temp 98 F (36.7 C)   Resp 16   Ht 5' 10 (1.778 m)   Wt 233 lb (105.7 kg)   SpO2 98%   BMI 33.43 kg/m  Wt Readings from Last 3 Encounters:  02/10/24 233 lb (105.7 kg)  11/10/23 232 lb 3.2 oz (105.3 kg)  09/29/23 229 lb 12.8 oz (104.2 kg)    Physical Exam Vitals reviewed.  Constitutional:      General: She is not in acute distress.    Appearance: Normal appearance.  HENT:     Head: Normocephalic and atraumatic.     Right Ear: External ear normal.     Left Ear: External ear normal.     Mouth/Throat:     Pharynx: No oropharyngeal exudate or posterior oropharyngeal erythema.   Eyes:     General: No scleral icterus.       Right eye: No discharge.        Left eye: No discharge.     Conjunctiva/sclera: Conjunctivae normal.   Neck:     Thyroid : No thyromegaly.   Cardiovascular:     Rate and Rhythm: Normal rate and regular rhythm.  Pulmonary:     Effort: No respiratory distress.     Breath sounds: Normal breath sounds. No wheezing.  Abdominal:     General: Bowel sounds are normal.      Palpations: Abdomen is soft.     Tenderness: There is no abdominal tenderness.   Musculoskeletal:        General: No swelling or tenderness.  Cervical back: Neck supple. No tenderness.  Lymphadenopathy:     Cervical: No cervical adenopathy.   Skin:    Findings: No erythema or rash.   Neurological:     Mental Status: She is alert.   Psychiatric:        Mood and Affect: Mood normal.        Behavior: Behavior normal.         Outpatient Encounter Medications as of 02/10/2024  Medication Sig   bisoprolol -hydrochlorothiazide  (ZIAC ) 2.5-6.25 MG tablet TAKE 1 TABLET BY MOUTH DAILY FOR HIGH BLOOD PRESSURE   busPIRone  (BUSPAR ) 5 MG tablet TAKE 1 TABLET BY MOUTH TWICE A DAY   cholecalciferol  (VITAMIN D3) 25 MCG (1000 UNIT) tablet Take 1,000 Units by mouth daily.   citalopram  (CELEXA ) 40 MG tablet TAKE 1 TABLET BY MOUTH DAILY   clobetasol  ointment (TEMOVATE ) 0.05 % Apply 1 Application topically 2 (two) times daily. For 5 days   clopidogrel  (PLAVIX ) 75 MG tablet TAKE 1 TABLET BY MOUTH DAILY   diphenoxylate -atropine  (LOMOTIL ) 2.5-0.025 MG tablet TAKE 1 TABLET BY MOUTH 4 TIMES DAILY AS NEEDED FOR DIARRHEA OR LOOSE STOOLS   Loperamide  HCl (IMODIUM  PO) Take by mouth as needed.   meloxicam  (MOBIC ) 7.5 MG tablet Take 1 tablet (7.5 mg total) by mouth 2 (two) times daily.   Misc Natural Products (ALLERGY RELEAF SYSTEM PO) Take by mouth. Reported on 03/20/2016   nystatin  (MYCOSTATIN /NYSTOP ) powder Apply 1 Application topically 2 (two) times daily.   nystatin  cream (MYCOSTATIN ) Apply 1 application topically 2 (two) times daily.   rosuvastatin  (CRESTOR ) 10 MG tablet TAKE 1 TABLET BY MOUTH DAILY. INCREASED FROM 5 MG.   torsemide  (DEMADEX ) 20 MG tablet TAKE 1 TABLET BY MOUTH EVERY DAY AS NEEDED   Vaginal Lubricant (REPHRESH) GEL Place 1 applicator vaginally every 3 (three) days.   vitamin B-12 (CYANOCOBALAMIN ) 250 MCG tablet Take 250 mcg by mouth daily.   [DISCONTINUED] gabapentin  (NEURONTIN )  300 MG capsule TAKE 1 CAPSULE BY MOUTH AT BEDTIME   [DISCONTINUED] tiZANidine  (ZANAFLEX ) 4 MG tablet TAKE 1 TABLET BY MOUTH AT BEDTIME AS NEEDED FOR MUSCLE SPASMS   No facility-administered encounter medications on file as of 02/10/2024.     Lab Results  Component Value Date   WBC 6.3 09/29/2023   HGB 12.8 09/29/2023   HCT 38.8 09/29/2023   PLT 159 09/29/2023   GLUCOSE 86 02/10/2024   CHOL 139 02/10/2024   TRIG 83 02/10/2024   HDL 55 02/10/2024   LDLCALC 68 02/10/2024   ALT 11 02/10/2024   AST 14 02/10/2024   NA 139 02/10/2024   K 4.6 02/10/2024   CL 104 02/10/2024   CREATININE 1.07 (H) 02/10/2024   BUN 27 02/10/2024   CO2 21 02/10/2024   TSH 1.10 10/08/2023   INR 0.9 06/12/2023   HGBA1C 5.6 02/10/2024   MICROALBUR <0.7 10/08/2023       Assessment & Plan:  Hypercholesteremia Assessment & Plan: Continue crestor . Low cholesterol diet and exercise. Follow lipid panel and liver function tests.   Orders: -     Hepatic function panel -     Lipid panel  Hyperglycemia Assessment & Plan: Low carb diet and exercise. Follow met b anda 1c.   Lab Results  Component Value Date   HGBA1C 5.6 02/10/2024     Orders: -     Basic metabolic panel with GFR -     Hemoglobin A1c  Primary hypertension Assessment & Plan: Continue on torsemide  and ziac .  Follow pressures.  Check metabolic panel.   Orders: -     Basic metabolic panel with GFR  Other fatigue Assessment & Plan: Discussed increased fatigue. Increased stress. Check cbc and metabolic panel and B12.   Orders: -     Vitamin B12 -     CBC with Differential/Platelet  Encounter for screening mammogram for malignant neoplasm of breast -     3D Screening Mammogram, Left and Right; Future  Aortic atherosclerosis (HCC) Assessment & Plan: Continue crestor .    Depression, major, single episode, mild (HCC) Assessment & Plan: On citalopram . Was on Trintilex. Felt was helping.  Not covered by insurance.  Attempted to  get assistance.  Have previously discussed referral to psychiatry to help with medication management. Has tried prozac - not help.  Continues on citalopram . Wanted to avoid benzodiazepines. Taking buspar . Discussed increased stress. Discussed counseling. She declines. Will notify me if feels needs further intervention.    Type 2 diabetes mellitus with hyperglycemia, without long-term current use of insulin (HCC) Assessment & Plan: Low carb diet and exercise.  Follow met b and a1c. Lab Results  Component Value Date   HGBA1C 5.6 02/10/2024      Hepatomegaly Assessment & Plan: Saw Dr Ole Berkeley 05/2023.  Abdominal ultrasound - no liver enlargement documented. Felt no further w/up warranted.     History of renal cell carcinoma Assessment & Plan: S/p left radical nephrectomy and partial adrenalectomy.  Seeing Dr Wilhelmenia Harada.  Stable. Recommended f/u in one year.    SVT (supraventricular tachycardia) (HCC) Assessment & Plan: Stable.       Dellar Fenton, MD

## 2024-02-11 ENCOUNTER — Other Ambulatory Visit: Payer: Self-pay | Admitting: Internal Medicine

## 2024-02-11 LAB — BASIC METABOLIC PANEL WITH GFR
BUN/Creatinine Ratio: 25 (ref 12–28)
BUN: 27 mg/dL (ref 8–27)
CO2: 21 mmol/L (ref 20–29)
Calcium: 9.5 mg/dL (ref 8.7–10.3)
Chloride: 104 mmol/L (ref 96–106)
Creatinine, Ser: 1.07 mg/dL — ABNORMAL HIGH (ref 0.57–1.00)
Glucose: 86 mg/dL (ref 70–99)
Potassium: 4.6 mmol/L (ref 3.5–5.2)
Sodium: 139 mmol/L (ref 134–144)
eGFR: 57 mL/min/{1.73_m2} — ABNORMAL LOW (ref >59)

## 2024-02-11 LAB — LIPID PANEL
Chol/HDL Ratio: 2.5 ratio (ref 0.0–4.4)
Cholesterol, Total: 139 mg/dL (ref 100–199)
HDL: 55 mg/dL (ref >39)
LDL Chol Calc (NIH): 68 mg/dL (ref 0–99)
Triglycerides: 83 mg/dL (ref 0–149)
VLDL Cholesterol Cal: 16 mg/dL (ref 5–40)

## 2024-02-11 LAB — HEPATIC FUNCTION PANEL
ALT: 11 IU/L (ref 0–32)
AST: 14 IU/L (ref 0–40)
Albumin: 4.5 g/dL (ref 3.9–4.9)
Alkaline Phosphatase: 96 IU/L (ref 44–121)
Bilirubin Total: 0.5 mg/dL (ref 0.0–1.2)
Bilirubin, Direct: 0.18 mg/dL (ref 0.00–0.40)
Total Protein: 6.3 g/dL (ref 6.0–8.5)

## 2024-02-11 LAB — HEMOGLOBIN A1C
Est. average glucose Bld gHb Est-mCnc: 114 mg/dL
Hgb A1c MFr Bld: 5.6 % (ref 4.8–5.6)

## 2024-02-11 NOTE — Telephone Encounter (Signed)
 Refilled zanaflex  and gabapentin .

## 2024-02-13 ENCOUNTER — Ambulatory Visit: Payer: Self-pay | Admitting: Internal Medicine

## 2024-02-15 ENCOUNTER — Encounter: Payer: Self-pay | Admitting: Internal Medicine

## 2024-02-15 NOTE — Assessment & Plan Note (Signed)
 Stable

## 2024-02-15 NOTE — Assessment & Plan Note (Signed)
 Continue crestor.  Low cholesterol diet and exercise.  Follow lipid panel and liver function tests.

## 2024-02-15 NOTE — Assessment & Plan Note (Signed)
 S/p left radical nephrectomy and partial adrenalectomy.  Seeing Dr Cathie Hoops.  Stable.  Recommended f/u in one year.

## 2024-02-15 NOTE — Assessment & Plan Note (Signed)
 Low carb diet and exercise.  Follow met b and a1c. Lab Results  Component Value Date   HGBA1C 5.6 02/10/2024

## 2024-02-15 NOTE — Assessment & Plan Note (Signed)
 Continue crestor

## 2024-02-15 NOTE — Assessment & Plan Note (Signed)
 On citalopram . Was on Trintilex. Felt was helping.  Not covered by insurance.  Attempted to get assistance.  Have previously discussed referral to psychiatry to help with medication management. Has tried prozac - not help.  Continues on citalopram . Wanted to avoid benzodiazepines. Taking buspar . Discussed increased stress. Discussed counseling. She declines. Will notify me if feels needs further intervention.

## 2024-02-15 NOTE — Assessment & Plan Note (Signed)
 Saw Dr Servando Snare 05/2023.  Abdominal ultrasound - no liver enlargement documented. Felt no further w/up warranted.

## 2024-02-15 NOTE — Assessment & Plan Note (Signed)
 Discussed increased fatigue. Increased stress. Check cbc and metabolic panel and B12.

## 2024-02-15 NOTE — Assessment & Plan Note (Signed)
 Continue on torsemide  and ziac .  Follow pressures.  Check metabolic panel.

## 2024-02-16 DIAGNOSIS — H2511 Age-related nuclear cataract, right eye: Secondary | ICD-10-CM | POA: Diagnosis not present

## 2024-02-17 DIAGNOSIS — H2512 Age-related nuclear cataract, left eye: Secondary | ICD-10-CM | POA: Diagnosis not present

## 2024-02-17 DIAGNOSIS — H25042 Posterior subcapsular polar age-related cataract, left eye: Secondary | ICD-10-CM | POA: Diagnosis not present

## 2024-02-17 DIAGNOSIS — H25012 Cortical age-related cataract, left eye: Secondary | ICD-10-CM | POA: Diagnosis not present

## 2024-02-18 ENCOUNTER — Other Ambulatory Visit: Payer: Self-pay | Admitting: Internal Medicine

## 2024-02-19 ENCOUNTER — Telehealth: Payer: Self-pay

## 2024-02-19 NOTE — Telephone Encounter (Signed)
 Mammogram is scheduled for 03/12/24.

## 2024-02-20 ENCOUNTER — Encounter

## 2024-03-01 ENCOUNTER — Telehealth: Payer: Self-pay | Admitting: Internal Medicine

## 2024-03-01 DIAGNOSIS — R944 Abnormal results of kidney function studies: Secondary | ICD-10-CM

## 2024-03-01 NOTE — Telephone Encounter (Signed)
 Patient need labs ordered please

## 2024-03-08 ENCOUNTER — Other Ambulatory Visit: Payer: Self-pay | Admitting: Internal Medicine

## 2024-03-08 DIAGNOSIS — H2512 Age-related nuclear cataract, left eye: Secondary | ICD-10-CM | POA: Diagnosis not present

## 2024-03-08 DIAGNOSIS — H25042 Posterior subcapsular polar age-related cataract, left eye: Secondary | ICD-10-CM | POA: Diagnosis not present

## 2024-03-08 DIAGNOSIS — H25012 Cortical age-related cataract, left eye: Secondary | ICD-10-CM | POA: Diagnosis not present

## 2024-03-12 ENCOUNTER — Ambulatory Visit
Admission: RE | Admit: 2024-03-12 | Discharge: 2024-03-12 | Disposition: A | Discharge status: Home / Self Care | Source: Ambulatory Visit | Attending: Internal Medicine | Admitting: Internal Medicine

## 2024-03-12 ENCOUNTER — Other Ambulatory Visit

## 2024-03-12 DIAGNOSIS — Z1231 Encounter for screening mammogram for malignant neoplasm of breast: Secondary | ICD-10-CM | POA: Diagnosis not present

## 2024-03-12 NOTE — Addendum Note (Signed)
 Addended by: TANDA HARVEY D on: 03/12/2024 01:03 PM   Modules accepted: Orders

## 2024-03-15 ENCOUNTER — Other Ambulatory Visit: Payer: Self-pay | Admitting: Internal Medicine

## 2024-03-22 NOTE — Telephone Encounter (Signed)
 Noted

## 2024-04-05 ENCOUNTER — Other Ambulatory Visit: Payer: Self-pay | Admitting: Internal Medicine

## 2024-04-14 ENCOUNTER — Other Ambulatory Visit: Payer: Self-pay | Admitting: Internal Medicine

## 2024-04-19 ENCOUNTER — Other Ambulatory Visit: Payer: Self-pay | Admitting: Internal Medicine

## 2024-04-19 NOTE — Telephone Encounter (Signed)
 Reviewed. Please call and notify her that if she is needing a fluid pill prn, would like to reassess at a f/u appt.  Taking extra fluid pill, etc - can affect kidney function. Hold on refill at [facility] time. Can schedule appt to discuss or can discuss at her next appt.

## 2024-04-21 NOTE — Telephone Encounter (Signed)
 Pt says she will wait to discuss at her next appt because she only uses about twice a month

## 2024-05-20 ENCOUNTER — Other Ambulatory Visit: Payer: Self-pay | Admitting: Internal Medicine

## 2024-05-21 NOTE — Telephone Encounter (Signed)
 Rx ok'd for zanaflex .

## 2024-06-08 ENCOUNTER — Encounter: Payer: Self-pay | Admitting: Internal Medicine

## 2024-06-08 ENCOUNTER — Ambulatory Visit: Admitting: Internal Medicine

## 2024-06-08 VITALS — BP 128/72 | HR 80 | Resp 16 | Ht 70.0 in | Wt 234.0 lb

## 2024-06-08 DIAGNOSIS — R160 Hepatomegaly, not elsewhere classified: Secondary | ICD-10-CM

## 2024-06-08 DIAGNOSIS — I10 Essential (primary) hypertension: Secondary | ICD-10-CM | POA: Diagnosis not present

## 2024-06-08 DIAGNOSIS — Z23 Encounter for immunization: Secondary | ICD-10-CM

## 2024-06-08 DIAGNOSIS — E7800 Pure hypercholesterolemia, unspecified: Secondary | ICD-10-CM | POA: Diagnosis not present

## 2024-06-08 DIAGNOSIS — I471 Supraventricular tachycardia, unspecified: Secondary | ICD-10-CM

## 2024-06-08 DIAGNOSIS — E559 Vitamin D deficiency, unspecified: Secondary | ICD-10-CM

## 2024-06-08 DIAGNOSIS — I700 Atherosclerosis of aorta: Secondary | ICD-10-CM

## 2024-06-08 DIAGNOSIS — E1165 Type 2 diabetes mellitus with hyperglycemia: Secondary | ICD-10-CM

## 2024-06-08 DIAGNOSIS — F320 Major depressive disorder, single episode, mild: Secondary | ICD-10-CM

## 2024-06-08 DIAGNOSIS — Z85528 Personal history of other malignant neoplasm of kidney: Secondary | ICD-10-CM

## 2024-06-08 LAB — LIPID PANEL
Cholesterol: 131 mg/dL (ref 0–200)
HDL: 50.4 mg/dL (ref >39.00)
LDL Cholesterol: 60 mg/dL (ref 0–99)
NonHDL: 80.49
Total CHOL/HDL Ratio: 3
Triglycerides: 101 mg/dL (ref 0.0–149.0)
VLDL: 20.2 mg/dL (ref 0.0–40.0)

## 2024-06-08 LAB — BASIC METABOLIC PANEL WITH GFR
BUN: 22 mg/dL (ref 6–23)
CO2: 29 meq/L (ref 19–32)
Calcium: 10.3 mg/dL (ref 8.4–10.5)
Chloride: 99 meq/L (ref 96–112)
Creatinine, Ser: 1.03 mg/dL (ref 0.40–1.20)
GFR: 56.63 mL/min — ABNORMAL LOW (ref >60.00)
Glucose, Bld: 93 mg/dL (ref 70–99)
Potassium: 4.4 meq/L (ref 3.5–5.1)
Sodium: 137 meq/L (ref 135–145)

## 2024-06-08 LAB — HEPATIC FUNCTION PANEL
ALT: 14 U/L (ref 0–35)
AST: 18 U/L (ref 0–37)
Albumin: 4.8 g/dL (ref 3.5–5.2)
Alkaline Phosphatase: 75 U/L (ref 39–117)
Bilirubin, Direct: 0.1 mg/dL (ref 0.0–0.3)
Total Bilirubin: 0.7 mg/dL (ref 0.2–1.2)
Total Protein: 6.9 g/dL (ref 6.0–8.3)

## 2024-06-08 LAB — HEMOGLOBIN A1C: Hgb A1c MFr Bld: 5.7 % (ref 4.6–6.5)

## 2024-06-08 NOTE — Progress Notes (Signed)
 Subjective:    Patient ID: Jacqueline Nichols, female    DOB: 06-20-1956, 68 y.o.   MRN: [id number]  Patient here for  Chief Complaint  Patient presents with   Medical Management of Chronic Issues    HPI Here for a scheduled follow up - follow up regarding diabetes, hypercholesterolemia, hypertension and increased stress. Continues on citalopram  and buspar . Increased stress. Her and her partner have separated. He has moved out. Discussed. Discussed therapy and psychiatry referral. She declines at this time. Has good family support. Had f/u with Dr Babara - 09/29/23 - f/u [facility]. Stable. Recommended f/u in one year. Saw Dr Hooten 10/2023 - right hip pain - right greater trochanteric bursitis. S/p injection. Persistent issues - f/u 02/06/24 - repeat injection  breathing stable.    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   Urinary incontinence    Past Surgical History:  Procedure Laterality Date   CHOLECYSTECTOMY  2001   COLONOSCOPY WITH PROPOFOL  N/A 03/27/2016   Procedure: COLONOSCOPY WITH PROPOFOL ;  Surgeon: Rogelia Copping, MD;  Location: [facility] ENDOSCOPY;  Service: Endoscopy;  Laterality: N/A;   COLONOSCOPY WITH PROPOFOL  N/A 08/31/2021   Procedure: COLONOSCOPY WITH PROPOFOL ;  Surgeon: Copping Rogelia, MD;  Location: [facility];  Service: Endoscopy;  Laterality: N/A;   KNEE SURGERY  2003-2009   s/p 8 knee surgeries (with 3 knee replacements)   NEPHRECTOMY RADICAL  07/2015   NISSEN FUNDOPLICATION     Family History  Problem Relation Age of Onset   Colon cancer Mother        died - age 47   Heart disease Father    Hypertension Father    Lymphoma Father    Arthritis/Rheumatoid Sister    Lung cancer Brother        smoked   Arthritis/Rheumatoid Paternal  Grandmother    Cancer Maternal Uncle        Unknown type of cancer    Cancer Paternal Aunt        Unknown type of cancer    Throat cancer Paternal Uncle    Colon cancer Paternal Uncle    Breast cancer Neg Hx    Social History   Socioeconomic History   Marital status: Divorced    Spouse name: Not on file   Number of children: 2   Years of education: Not on file   Highest education level: Not on file  Occupational History   Not on file  Tobacco Use   Smoking status: Former    Current packs/day: 0.00    Average packs/day: 1 pack/day for 15.0 years (15.0 ttl pk-yrs)    Types: Cigarettes    Start date: 10/23/1997    Quit date: 10/23/2012    Years since quitting: 11.6   Smokeless tobacco: Never   Tobacco comments:    Quit Feb 2014  Vaping Use   Vaping status: Former   Start date: 02/20/2013   Quit date: 02/20/2014  Substance and Sexual Activity   Alcohol use: Yes    Alcohol/week: 1.0 standard drink of alcohol    Types: 1 Glasses of wine per week    Comment: social drinking - approx 2-4  monthly    Drug use: No   Sexual activity: Not Currently  Other Topics Concern   Not on file  Social History Narrative   Not on file   Social Drivers of Health   Financial Resource Strain: Low Risk  (10/24/2023)   Received from [facility]   Overall Financial Resource Strain (CARDIA)    Difficulty of Paying Living Expenses: Not hard at all  Food Insecurity: No Food Insecurity (10/24/2023)   Received from [facility]   Hunger Vital Sign    Within the past 12 months, you worried that your food would run out before you got the money to buy more.: Never true    Within the past 12 months, the food you bought just didn't last and you didn't have money to get more.: Never true  Transportation Needs: No Transportation Needs (10/24/2023)   Received from [facility] - Transportation    In the past 12 months, has lack of transportation  kept you from medical appointments or from getting medications?: No    Lack of Transportation (Non-Medical): No  Physical Activity: Not on file  Stress: Not on file  Social Connections: Not on file     Review of Systems  Constitutional:  Negative for appetite change and unexpected weight change.  HENT:  Negative for congestion and sinus pressure.   Respiratory:  Negative for cough and chest tightness.        Breathing stable.   Cardiovascular:  Negative for chest pain and palpitations.       No increased swelling.   Gastrointestinal:  Negative for abdominal pain, diarrhea, nausea and vomiting.  Genitourinary:  Negative for difficulty urinating and dysuria.  Musculoskeletal:  Negative for joint swelling and myalgias.       Has been seeing ortho - right hip pain.   Skin:  Negative for color change and rash.  Neurological:  Negative for dizziness and headaches.  Psychiatric/Behavioral:         Increased stress as outlined.        Objective:     BP 128/72   Pulse 80   Resp 16   Ht 5' 10 (1.778 m)   Wt 234 lb (106.1 kg)   SpO2 98%   BMI 33.58 kg/m  Wt Readings from Last 3 Encounters:  06/08/24 234 lb (106.1 kg)  02/10/24 233 lb (105.7 kg)  11/10/23 232 lb 3.2 oz (105.3 kg)    Physical Exam Vitals reviewed.  Constitutional:      General: She is not in acute distress.    Appearance: Normal appearance.  HENT:     Head: Normocephalic and atraumatic.     Right Ear: External ear normal.     Left Ear: External ear normal.     Mouth/Throat:     Pharynx: No oropharyngeal exudate or posterior oropharyngeal erythema.  Eyes:     General: No scleral icterus.       Right eye: No discharge.        Left eye: No discharge.     Conjunctiva/sclera: Conjunctivae normal.  Neck:     Thyroid : No thyromegaly.  Cardiovascular:     Rate and Rhythm: Normal rate and regular rhythm.  Pulmonary:     Effort: No respiratory distress.     Breath sounds: Normal breath sounds. No wheezing.   Abdominal:     General: Bowel sounds are normal.     Palpations: Abdomen is soft.  Tenderness: There is no abdominal tenderness.  Musculoskeletal:        General: No swelling or tenderness.     Cervical back: Neck supple. No tenderness.  Lymphadenopathy:     Cervical: No cervical adenopathy.  Skin:    Findings: No erythema or rash.  Neurological:     Mental Status: She is alert.  Psychiatric:        Mood and Affect: Mood normal.        Behavior: Behavior normal.         Outpatient Encounter Medications as of 06/08/2024  Medication Sig   bisoprolol -hydrochlorothiazide  (ZIAC ) 2.5-6.25 MG tablet TAKE 1 TABLET BY MOUTH DAILY FOR HIGH BLOOD PRESSURE   busPIRone  (BUSPAR ) 5 MG tablet TAKE 1 TABLET BY MOUTH TWICE A DAY   cholecalciferol  (VITAMIN D3) 25 MCG (1000 UNIT) tablet Take 1,000 Units by mouth daily.   citalopram  (CELEXA ) 40 MG tablet TAKE 1 TABLET BY MOUTH DAILY   clobetasol  ointment (TEMOVATE ) 0.05 % Apply 1 Application topically 2 (two) times daily. For 5 days   clopidogrel  (PLAVIX ) 75 MG tablet TAKE 1 TABLET BY MOUTH DAILY   diphenoxylate -atropine  (LOMOTIL ) 2.5-0.025 MG tablet TAKE 1 TABLET BY MOUTH 4 TIMES DAILY AS NEEDED FOR DIARRHEA OR LOOSE STOOLS   gabapentin  (NEURONTIN ) 300 MG capsule TAKE 1 CAPSULE BY MOUTH AT BEDTIME   Loperamide  HCl (IMODIUM  PO) Take by mouth as needed.   meloxicam  (MOBIC ) 7.5 MG tablet TAKE 1 TABLET BY MOUTH TWICE A DAY   Misc Natural Products (ALLERGY RELEAF SYSTEM PO) Take by mouth. Reported on 03/20/2016   nystatin  (MYCOSTATIN /NYSTOP ) powder Apply 1 Application topically 2 (two) times daily.   nystatin  cream (MYCOSTATIN ) Apply 1 application topically 2 (two) times daily.   rosuvastatin  (CRESTOR ) 10 MG tablet TAKE 1 TABLET BY MOUTH DAILY. INCREASED FROM 5 MG.   tiZANidine  (ZANAFLEX ) 4 MG tablet TAKE 1 TABLET BY MOUTH AT BEDTIME AS NEEDED FOR MUSCLE SPASMS   torsemide  (DEMADEX ) 20 MG tablet TAKE 1 TABLET BY MOUTH EVERY DAY AS NEEDED   Vaginal  Lubricant (REPHRESH) GEL Place 1 applicator vaginally every 3 (three) days.   vitamin B-12 (CYANOCOBALAMIN ) 250 MCG tablet Take 250 mcg by mouth daily.   No facility-administered encounter medications on file as of 06/08/2024.     Lab Results  Component Value Date   WBC 6.3 09/29/2023   HGB 12.8 09/29/2023   HCT 38.8 09/29/2023   PLT 159 09/29/2023   GLUCOSE 93 06/08/2024   CHOL 131 06/08/2024   TRIG 101.0 06/08/2024   HDL 50.40 06/08/2024   LDLCALC 60 06/08/2024   ALT 14 06/08/2024   AST 18 06/08/2024   NA 137 06/08/2024   K 4.4 06/08/2024   CL 99 06/08/2024   CREATININE 1.03 06/08/2024   BUN 22 06/08/2024   CO2 29 06/08/2024   TSH 1.10 10/08/2023   INR 0.9 06/12/2023   HGBA1C 5.7 06/08/2024    MM 3D SCREENING MAMMOGRAM BILATERAL BREAST Result Date: 03/17/2024 CLINICAL DATA:  Screening. EXAM: DIGITAL SCREENING BILATERAL MAMMOGRAM WITH TOMOSYNTHESIS AND CAD TECHNIQUE: Bilateral screening digital craniocaudal and mediolateral oblique mammograms were obtained. Bilateral screening digital breast tomosynthesis was performed. The images were evaluated with computer-aided detection. COMPARISON:  Previous exam(s). ACR Breast Density Category a: The breasts are almost entirely fatty. FINDINGS: There are no findings suspicious for malignancy. IMPRESSION: No mammographic evidence of malignancy. A result letter of this screening mammogram will be mailed directly to the patient. RECOMMENDATION: Screening mammogram in one year. (Code:SM-B-01Y) BI-RADS  CATEGORY  1: Negative. Electronically Signed   By: Alm Parkins M.D.   On: 03/17/2024 12:49       Assessment & Plan:  Type 2 diabetes mellitus with hyperglycemia, without long-term current use of insulin (HCC) Assessment & Plan: Low carb diet and exercise. Follow met b and A1c.  Lab Results  Component Value Date   HGBA1C 5.7 06/08/2024     Orders: -     Basic metabolic panel with GFR -     Hemoglobin A1c -     Microalbumin / creatinine  urine ratio  Primary hypertension Assessment & Plan: Continue on torsemide  and ziac .  Follow pressures.  Follow metabolic panel.   Orders: -     Basic metabolic panel with GFR  Hypercholesteremia Assessment & Plan: Continue crestor . Low cholesterol diet and exercise. Follow lipid panel and liver function tests.   Orders: -     Hepatic function panel -     Lipid panel  Immunization due -     Flu vaccine HIGH DOSE PF(Fluzone Trivalent)  Vitamin D  deficiency Assessment & Plan: Vitamin  D level 27 10/08/23 - continue vitamin D  supplements.    SVT (supraventricular tachycardia) Assessment & Plan: Stable.    Aortic atherosclerosis Assessment & Plan: Continue crestor .    Depression, major, single episode, mild Assessment & Plan: On citalopram . Was on Trintilex. Felt was helping.  Not covered by insurance.  Attempted to get assistance.  Have previously discussed referral to psychiatry to help with medication management. Has tried prozac - not help.  Continues on citalopram . Wanted to avoid benzodiazepines. Taking buspar . Discussed increased stress. Discussed counseling. No SI. Family supportive. Will notify me if feels needs further intervention.    Hepatomegaly Assessment & Plan: Saw Dr Jinny 05/2023.  Abdominal ultrasound - no liver enlargement documented. Felt no further w/up warranted.     History of renal cell carcinoma Assessment & Plan: S/p left radical nephrectomy and partial adrenalectomy.  Seeing Dr Babara.  Stable. Recommended f/u in one year.       Allena Hamilton, MD

## 2024-06-09 ENCOUNTER — Ambulatory Visit: Payer: Self-pay | Admitting: Internal Medicine

## 2024-06-13 ENCOUNTER — Encounter: Payer: Self-pay | Admitting: Internal Medicine

## 2024-06-13 NOTE — Assessment & Plan Note (Signed)
 S/p left radical nephrectomy and partial adrenalectomy.  Seeing Dr Cathie Hoops.  Stable.  Recommended f/u in one year.

## 2024-06-13 NOTE — Assessment & Plan Note (Signed)
 On citalopram . Was on Trintilex. Felt was helping.  Not covered by insurance.  Attempted to get assistance.  Have previously discussed referral to psychiatry to help with medication management. Has tried prozac - not help.  Continues on citalopram . Wanted to avoid benzodiazepines. Taking buspar . Discussed increased stress. Discussed counseling. No SI. Family supportive. Will notify me if feels needs further intervention.

## 2024-06-13 NOTE — Assessment & Plan Note (Signed)
 Vitamin  D level 27 10/08/23 - continue vitamin D  supplements.

## 2024-06-13 NOTE — Assessment & Plan Note (Signed)
 Continue crestor.  Low cholesterol diet and exercise.  Follow lipid panel and liver function tests.

## 2024-06-13 NOTE — Assessment & Plan Note (Signed)
 Low carb diet and exercise. Follow met b and A1c.  Lab Results  Component Value Date   HGBA1C 5.7 06/08/2024

## 2024-06-13 NOTE — Assessment & Plan Note (Signed)
 Continue crestor

## 2024-06-13 NOTE — Assessment & Plan Note (Signed)
 Stable

## 2024-06-13 NOTE — Assessment & Plan Note (Signed)
 Saw Dr Servando Snare 05/2023.  Abdominal ultrasound - no liver enlargement documented. Felt no further w/up warranted.

## 2024-06-13 NOTE — Assessment & Plan Note (Signed)
 Continue on torsemide and ziac.  Follow pressures.  Follow metabolic panel.

## 2024-06-14 ENCOUNTER — Other Ambulatory Visit: Payer: Self-pay | Admitting: Internal Medicine

## 2024-06-21 ENCOUNTER — Other Ambulatory Visit: Payer: Self-pay | Admitting: Internal Medicine

## 2024-07-05 ENCOUNTER — Other Ambulatory Visit: Payer: Self-pay | Admitting: Internal Medicine

## 2024-07-15 ENCOUNTER — Other Ambulatory Visit: Payer: Self-pay | Admitting: Internal Medicine

## 2024-07-20 NOTE — Telephone Encounter (Signed)
 open in error

## 2024-07-26 ENCOUNTER — Other Ambulatory Visit: Payer: Self-pay | Admitting: Internal Medicine

## 2024-07-26 NOTE — Telephone Encounter (Signed)
 Rx ok'd for refill tizanidine .

## 2024-08-03 DIAGNOSIS — M5416 Radiculopathy, lumbar region: Secondary | ICD-10-CM | POA: Diagnosis not present

## 2024-08-03 DIAGNOSIS — M7061 Trochanteric bursitis, right hip: Secondary | ICD-10-CM | POA: Diagnosis not present

## 2024-08-03 DIAGNOSIS — Z96651 Presence of right artificial knee joint: Secondary | ICD-10-CM | POA: Diagnosis not present

## 2024-08-03 DIAGNOSIS — M47816 Spondylosis without myelopathy or radiculopathy, lumbar region: Secondary | ICD-10-CM | POA: Diagnosis not present

## 2024-08-06 ENCOUNTER — Other Ambulatory Visit: Payer: Self-pay | Admitting: Orthopedic Surgery

## 2024-08-06 DIAGNOSIS — M47816 Spondylosis without myelopathy or radiculopathy, lumbar region: Secondary | ICD-10-CM

## 2024-08-06 DIAGNOSIS — M5416 Radiculopathy, lumbar region: Secondary | ICD-10-CM

## 2024-08-08 ENCOUNTER — Ambulatory Visit
Admission: RE | Admit: 2024-08-08 | Discharge: 2024-08-08 | Disposition: A | Source: Ambulatory Visit | Attending: Orthopedic Surgery | Admitting: Orthopedic Surgery

## 2024-08-08 DIAGNOSIS — M5416 Radiculopathy, lumbar region: Secondary | ICD-10-CM | POA: Diagnosis not present

## 2024-08-08 DIAGNOSIS — M47816 Spondylosis without myelopathy or radiculopathy, lumbar region: Secondary | ICD-10-CM

## 2024-08-28 ENCOUNTER — Other Ambulatory Visit: Payer: Self-pay | Admitting: Internal Medicine

## 2024-09-20 ENCOUNTER — Ambulatory Visit: Payer: Self-pay

## 2024-09-20 ENCOUNTER — Ambulatory Visit (INDEPENDENT_AMBULATORY_CARE_PROVIDER_SITE_OTHER): Admitting: Family Medicine

## 2024-09-20 VITALS — BP 135/80 | HR 55 | Resp 16

## 2024-09-20 DIAGNOSIS — R350 Frequency of micturition: Secondary | ICD-10-CM

## 2024-09-20 DIAGNOSIS — R319 Hematuria, unspecified: Secondary | ICD-10-CM | POA: Diagnosis not present

## 2024-09-20 DIAGNOSIS — N390 Urinary tract infection, site not specified: Secondary | ICD-10-CM

## 2024-09-20 LAB — POCT URINALYSIS DIPSTICK
Bilirubin, UA: NEGATIVE
Glucose, UA: NEGATIVE
Nitrite, UA: NEGATIVE
Protein, UA: POSITIVE — AB
Spec Grav, UA: 1.01
Urobilinogen, UA: 1 U/dL
pH, UA: 8

## 2024-09-20 MED ORDER — CEPHALEXIN 500 MG PO CAPS: 500.0000 mg | ORAL_CAPSULE | Freq: Three times a day (TID) | ORAL | 0 refills | Status: AC

## 2024-09-20 NOTE — Progress Notes (Signed)
 "     Established patient visit   Patient: Jacqueline Nichols   DOB: 01/23/1956   69 y.o. Female  MRN: [id number] Visit Date: 09/20/2024  Today's healthcare provider: Nancyann Perry, MD   Chief Complaint  Patient presents with   Acute Visit   Urinary Tract Infection    Possible UTI?   Subjective    Discussed the use of AI scribe software for clinical note transcription with the patient, who gave verbal consent to proceed.  History of Present Illness   Jacqueline Nichols is a 69 year old female who presents with symptoms of a urinary tract infection.  Symptoms began suddenly on Friday evening, including dysuria, hematuria, and increased urinary frequency. No back pain, chills, fever, or sweats.  She has a history of stroke and is currently taking Plavix  as her only blood thinner. She is allergic to codeine and has not taken Keflex  or cephalexin  before. She started taking cranberry juice and Azo on Saturday morning to alleviate her symptoms.      Medications: Show/hide medication list[1] Review of Systems     Objective    BP 135/80 (BP Location: Right Arm, Patient Position: Sitting, Cuff Size: Normal)   Pulse (!) 55   Resp 16   SpO2 96%   Physical Exam   General appearance: Mildly obese female, cooperative and in no acute distress Head: Normocephalic, without obvious abnormality, atraumatic Respiratory: Respirations even and unlabored, normal respiratory rate Extremities: All extremities are intact.  Skin: Skin color, texture, turgor normal. No rashes seen  Psych: Appropriate mood and affect. Neurologic: Mental status: Alert, oriented to person, place, and time, thought content appropriate.   Results for orders placed or performed in visit on 09/20/24  POCT urinalysis dipstick  Result Value Ref Range   Color, UA cloudy    Clarity, UA cloudy    Glucose, UA Negative Negative   Bilirubin, UA negatve    Ketones, UA Large    Spec Grav, UA 1.010 1.010 - 1.025    Blood, UA Large    pH, UA 8.0 5.0 - 8.0   Protein, UA Positive (A) Negative   Urobilinogen, UA 1.0 0.2 or 1.0 E.U./dL   Nitrite, UA negative    Leukocytes, UA Large (3+) (A) Negative   Appearance     Odor      Assessment & Plan    1. Frequent urination (Primary)   2. Urinary tract infection with hematuria, site unspecified  - cephALEXin  (KEFLEX ) 500 MG capsule; Take 1 capsule (500 mg total) by mouth 3 (three) times daily for 7 days.  Dispense: 21 capsule; Refill: 0 - Urine Culture  Call if symptoms change or if not rapidly improving.        Nancyann Perry, MD  [facility] [phone] (phone) [phone] (fax)  [facility]    [1]  Outpatient Medications Prior to Visit  Medication Sig   bisoprolol -hydrochlorothiazide  (ZIAC ) 2.5-6.25 MG tablet TAKE 1 TABLET BY MOUTH DAILY FOR HIGH BLOOD PRESSURE   busPIRone  (BUSPAR ) 5 MG tablet TAKE 1 TABLET BY MOUTH TWICE A DAY   cholecalciferol  (VITAMIN D3) 25 MCG (1000 UNIT) tablet Take 1,000 Units by mouth daily.   citalopram  (CELEXA ) 40 MG tablet TAKE 1 TABLET BY MOUTH DAILY   clobetasol  ointment (TEMOVATE ) 0.05 % Apply 1 Application topically 2 (two) times daily. For 5 days   clopidogrel  (PLAVIX ) 75 MG tablet TAKE 1 TABLET BY MOUTH DAILY   diphenoxylate -atropine  (LOMOTIL ) 2.5-0.025 MG  tablet TAKE 1 TABLET BY MOUTH 4 TIMES DAILY AS NEEDED FOR DIARRHEA OR LOOSE STOOLS   gabapentin  (NEURONTIN ) 300 MG capsule TAKE 1 CAPSULE BY MOUTH AT BEDTIME   Loperamide  HCl (IMODIUM  PO) Take by mouth as needed.   meloxicam  (MOBIC ) 7.5 MG tablet TAKE 1 TABLET BY MOUTH TWICE A DAY   Misc Natural Products (ALLERGY RELEAF SYSTEM PO) Take by mouth. Reported on 03/20/2016   nystatin  (MYCOSTATIN /NYSTOP ) powder Apply 1 Application topically 2 (two) times daily.   nystatin  cream (MYCOSTATIN ) Apply 1 application topically 2 (two) times daily.   rosuvastatin  (CRESTOR ) 10 MG tablet TAKE 1 TABLET BY MOUTH DAILY. INCREASED  FROM 5 MG.   tiZANidine  (ZANAFLEX ) 4 MG tablet TAKE 1 TABLET BY MOUTH AT BEDTIME AS NEEDED FOR MUSCLE SPASMS   torsemide  (DEMADEX ) 20 MG tablet TAKE 1 TABLET BY MOUTH EVERY DAY AS NEEDED   Vaginal Lubricant (REPHRESH) GEL Place 1 applicator vaginally every 3 (three) days.   vitamin B-12 (CYANOCOBALAMIN ) 250 MCG tablet Take 250 mcg by mouth daily.   No facility-administered medications prior to visit.   "

## 2024-09-20 NOTE — Telephone Encounter (Signed)
 FYI Only or Action Required?: FYI only for provider: appointment scheduled on 09/20/24.  Patient was last seen in primary care on 06/08/2024 by Glendia Shad, MD.  Called Nurse Triage reporting Hematuria, Urinary Frequency, and Dysuria.  Symptoms began 3 days ago.  Interventions attempted: OTC medications: Azo; cranberry juice.  Symptoms are: gradually worsening.  Triage Disposition: See HCP Within 4 Hours (Or PCP Triage)  Patient/caregiver understands and will follow disposition?: Yes                 Message from [facility] sent at 09/20/2024 12:33 PM EST  Reason for Triage: UTI since Friday, taking AZO and cranberry juice, symptoms came back yesterday and now has blood in urine   Reason for Disposition  [1] SEVERE pain with urination (e.g., excruciating) AND [2] not improved after 2 hours of pain medicine  Answer Assessment - Initial Assessment Questions 1. SEVERITY: How bad is the pain?  (e.g., Scale 1-10; mild, moderate, or severe)     8/10 with AZO.  2. FREQUENCY: How many times have you had painful urination today?      4-5 times since 9am, becoming more frequent.  3. PATTERN: Is pain present every time you urinate or just sometimes?      Every time.  4. ONSET: When did the painful urination start?      Friday evening. Saturday AM started with AZO and cranberry juice Symptoms improved yesterday, worsening today  5. FEVER: Do you have a fever? If Yes, ask: What is your temperature, how was it measured, and when did it start?     No.  6. PAST UTI: Have you had a urine infection before? If Yes, ask: When was the last time? and What happened that time?      Yes, several years since last UTI.  7. CAUSE: What do you think is causing the painful urination?  (e.g., UTI, scratch, Herpes sore)     UTI.  8. OTHER SYMPTOMS: Do you have any other symptoms? (e.g., blood in urine, flank pain, genital sores, urgency, vaginal discharge)     Urinary  frequency, pressure/pulsating feeling in bladder when urinating, urinary retention (as to urinate shortly after going), fleck of blood clots and pink urine (blood in urine). No vomiting,  Protocols used: Urination Pain - Female-A-AH

## 2024-09-20 NOTE — Telephone Encounter (Signed)
 Attempted to contact pt to f/u on request. NA, LM for pt to return call to speak to staff. Attempt #1    Copied from CRM #[id number]. Topic: Clinical - Medical Advice >> Sep 20, 2024  3:00 PM Victoria B wrote: Reason for CRM: Patient called in states forgot to ask Dr for med for yeast infection for prevention, since she is takin an antibiotic

## 2024-09-20 NOTE — Telephone Encounter (Signed)
 Attempted to contact pt to f/u on request. NA, LM for pt to return call to speak to staff. With request and LM x3, will route to clinic for further review d/t appt today 09/20/24.    Copied from CRM [id number]. Topic: Clinical - Medical Advice >> Sep 20, 2024  3:00 PM Victoria B wrote: Reason for CRM: Patient called in states forgot to ask Dr for med for yeast infection for prevention, since she is takin an antibiotic

## 2024-09-21 ENCOUNTER — Telehealth: Payer: Self-pay

## 2024-09-21 MED ORDER — FLUCONAZOLE 150 MG PO TABS: 150.0000 mg | ORAL_TABLET | Freq: Once | ORAL | 0 refills | Status: AC

## 2024-09-21 MED ORDER — FLUCONAZOLE 150 MG PO TABS: 150.0000 mg | ORAL_TABLET | Freq: Once | ORAL | 0 refills | Status: DC

## 2024-09-21 NOTE — Addendum Note (Signed)
 Addended by: GASPER NANCYANN BRAVO on: 09/21/2024 10:32 AM   Modules accepted: Orders

## 2024-09-21 NOTE — Telephone Encounter (Signed)
 I have sent in the prescription for diflucan . Let us  know if any problems.

## 2024-09-21 NOTE — Telephone Encounter (Signed)
 Reviewed. Was evaluated and treated.

## 2024-09-21 NOTE — Telephone Encounter (Signed)
 Copied from CRM #[id number]. Topic: Clinical - Medication Question >> Sep 21, 2024  9:10 AM Montie POUR wrote: Reason for CRM:  Ms. Goswick is checking to see if Dr. Gasper would call her in medication so she will not get a yeast infection. She is on an antibiotic. Notes are in chart from yesterday. Please call her with questions at [phone] or send medication to [facility]. Thanks

## 2024-09-21 NOTE — Telephone Encounter (Signed)
 PT NOTIFIED

## 2024-09-21 NOTE — Telephone Encounter (Signed)
 Have sent [facility]lucan  [facility]

## 2024-09-21 NOTE — Addendum Note (Signed)
 Addended by: GLENDIA ALLENA RAMAN on: 09/21/2024 12:43 PM   Modules accepted: Orders

## 2024-09-23 ENCOUNTER — Ambulatory Visit: Admitting: Internal Medicine

## 2024-09-23 ENCOUNTER — Encounter: Payer: Self-pay | Admitting: Internal Medicine

## 2024-09-23 ENCOUNTER — Ambulatory Visit: Payer: Self-pay | Admitting: Family Medicine

## 2024-09-23 VITALS — BP 128/82 | HR 58 | Temp 98.3°F | Ht 70.0 in | Wt 222.0 lb

## 2024-09-23 DIAGNOSIS — E7800 Pure hypercholesterolemia, unspecified: Secondary | ICD-10-CM

## 2024-09-23 DIAGNOSIS — Z85528 Personal history of other malignant neoplasm of kidney: Secondary | ICD-10-CM

## 2024-09-23 DIAGNOSIS — D509 Iron deficiency anemia, unspecified: Secondary | ICD-10-CM

## 2024-09-23 DIAGNOSIS — E1165 Type 2 diabetes mellitus with hyperglycemia: Secondary | ICD-10-CM | POA: Diagnosis not present

## 2024-09-23 DIAGNOSIS — F321 Major depressive disorder, single episode, moderate: Secondary | ICD-10-CM | POA: Diagnosis not present

## 2024-09-23 DIAGNOSIS — I10 Essential (primary) hypertension: Secondary | ICD-10-CM | POA: Diagnosis not present

## 2024-09-23 DIAGNOSIS — I471 Supraventricular tachycardia, unspecified: Secondary | ICD-10-CM

## 2024-09-23 DIAGNOSIS — Z0000 Encounter for general adult medical examination without abnormal findings: Secondary | ICD-10-CM

## 2024-09-23 DIAGNOSIS — E559 Vitamin D deficiency, unspecified: Secondary | ICD-10-CM | POA: Diagnosis not present

## 2024-09-23 DIAGNOSIS — R739 Hyperglycemia, unspecified: Secondary | ICD-10-CM

## 2024-09-23 LAB — SPECIMEN STATUS REPORT

## 2024-09-23 LAB — URINE CULTURE

## 2024-09-23 MED ORDER — BUSPIRONE HCL 5 MG PO TABS: 5.0000 mg | ORAL_TABLET | Freq: Two times a day (BID) | ORAL | 1 refills | Status: AC

## 2024-09-23 MED ORDER — GABAPENTIN 300 MG PO CAPS: 300.0000 mg | ORAL_CAPSULE | Freq: Every day | ORAL | 1 refills | Status: AC

## 2024-09-23 MED ORDER — CITALOPRAM HYDROBROMIDE 40 MG PO TABS: 40.0000 mg | ORAL_TABLET | Freq: Every day | ORAL | 1 refills | Status: AC

## 2024-09-23 NOTE — Patient Instructions (Signed)
 Magnesium glycinate - one before bed

## 2024-09-23 NOTE — Assessment & Plan Note (Addendum)
 Physical today 09/23/24.  Colonoscopy 08/31/21 - internal hemorrhoids. .  Mammogram 03/12/24- Birads I.

## 2024-09-23 NOTE — Progress Notes (Signed)
 "  Subjective:    Patient ID: Jacqueline Nichols, female    DOB: 02-06-1956, 69 y.o.   MRN: [id number]  Patient here for  Chief Complaint  Patient presents with   Annual Exam    HPI Here for a scheduled f/u. SABRA Evaluated 09/20/24 - UTI. Treated with keflex . Saw Dr Hooten 08/03/24 - hisotry of right and left TKA. Doing well with her knees. Felt to have lumbar radiculopathy. Recommended lumbar MRI.  Had f/u with Dr Babara - 09/29/23 - f/u [facility]. Stable. Recommended f/u in one year. Continues on citalopram  and buspar . Increased stress with some depression. Out of long term relationship. Not wanting to go out. Not wanting to do things previously enjoyed. No SI. Discussed. Agreeable for referral to psychiatry. Some headaches with increased stress. Breathing stable.    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;  Service: Endoscopy;  Laterality: N/A;   KNEE SURGERY  2003-2009   s/p 8 knee surgeries (with 3 knee replacements)   NEPHRECTOMY RADICAL  07/2015   NISSEN FUNDOPLICATION     Family History  Problem Relation Age of Onset   Colon cancer Mother        died - age 68   Heart disease Father    Hypertension Father    Lymphoma Father    Arthritis/Rheumatoid Sister    Lung cancer Brother        smoked   Arthritis/Rheumatoid Paternal Grandmother    Cancer  Maternal Uncle        Unknown type of cancer    Cancer Paternal Aunt        Unknown type of cancer    Throat cancer Paternal Uncle    Colon cancer Paternal Uncle    Breast cancer Neg Hx    Social History   Socioeconomic History   Marital status: Divorced    Spouse name: Not on file   Number of children: 2   Years of education: Not on file   Highest education level: Not on file  Occupational History   Not on file  Tobacco Use   Smoking status: Former    Current packs/day: 0.00    Average packs/day: 1 pack/day for 15.0 years (15.0 ttl pk-yrs)    Types: Cigarettes    Start date: 10/23/1997    Quit date: 10/23/2012    Years since quitting: 11.9   Smokeless tobacco: Never   Tobacco comments:    Quit Feb 2014  Vaping Use   Vaping status: Former   Start date: 02/20/2013   Quit date: 02/20/2014  Substance and Sexual Activity   Alcohol use: Yes    Alcohol/week: 1.0 standard drink of alcohol    Types: 1 Glasses of wine per week    Comment: social  drinking - approx 2-4 monthly    Drug use: No   Sexual activity: Not Currently  Other Topics Concern   Not on file  Social History Narrative   Not on file   Social Drivers of Health   Tobacco Use: Medium Risk (09/26/2024)   Patient History    Smoking Tobacco Use: Former    Smokeless Tobacco Use: Never    Passive Exposure: Not on file  Financial Resource Strain: Low Risk  (09/24/2024)   Received from [facility]   Overall Financial Resource Strain (CARDIA)    Difficulty of Paying Living Expenses: Not very hard  Food Insecurity: No Food Insecurity (09/24/2024)   Received from [facility]   Epic    Within the past 12 months, you worried that your food would run out before you got the money to buy more.: Never true    Within the past 12 months, the food you bought just didn't last and you didn't have money to get more.: Never true  Transportation Needs: No Transportation Needs (09/24/2024)    Received from [facility] - Transportation    In the past 12 months, has lack of transportation kept you from medical appointments or from getting medications?: No    Lack of Transportation (Non-Medical): No  Physical Activity: Not on file  Stress: Not on file  Social Connections: Not on file  Depression (PHQ2-9): High Risk (09/23/2024)   Depression (PHQ2-9)    PHQ-2 Score: 21  Alcohol Screen: Not on file  Housing: Low Risk  (09/24/2024)   Received from [facility]   Epic    In the last 12 months, was there a time when you were not able to pay the mortgage or rent on time?: No    In the past 12 months, how many times have you moved where you were living?: 0    At any time in the past 12 months, were you homeless or living in a shelter (including now)?: No  Utilities: Not At Risk (09/24/2024)   Received from [facility]   Epic    In the past 12 months has the electric, gas, oil, or water  company threatened to shut off services in your home?: No  Health Literacy: Not on file     Review of Systems  Constitutional:  Negative for appetite change and unexpected weight change.  HENT:  Negative for congestion, sinus pressure and sore throat.   Eyes:  Negative for pain and visual disturbance.  Respiratory:  Negative for cough, chest tightness and shortness of breath.   Cardiovascular:  Negative for chest pain, palpitations and leg swelling.  Gastrointestinal:  Negative for abdominal pain, diarrhea, nausea and vomiting.  Genitourinary:  Negative for difficulty urinating and dysuria.  Musculoskeletal:  Negative for joint swelling and myalgias.  Skin:  Negative for color change and rash.  Neurological:  Positive for headaches. Negative for dizziness.  Hematological:  Negative for adenopathy. Does not bruise/bleed easily.  Psychiatric/Behavioral:  Negative for agitation and dysphoric mood.        Objective:     BP 128/82    Pulse (!) 58   Temp 98.3 F (36.8 C) (Oral)   Ht 5' 10 (1.778 m)   Wt 222 lb (100.7 kg)   SpO2 97%   BMI 31.85 kg/m  Wt Readings from Last 3 Encounters:  09/23/24 222 lb (100.7 kg)  06/08/24 234 lb (106.1 kg)  02/10/24  233 lb (105.7 kg)    Physical Exam Vitals reviewed.  Constitutional:      General: She is not in acute distress.    Appearance: Normal appearance.  HENT:     Head: Normocephalic and atraumatic.     Right Ear: External ear normal.     Left Ear: External ear normal.     Mouth/Throat:     Pharynx: No oropharyngeal exudate or posterior oropharyngeal erythema.  Eyes:     General: No scleral icterus.       Right eye: No discharge.        Left eye: No discharge.     Conjunctiva/sclera: Conjunctivae normal.  Neck:     Thyroid : No thyromegaly.  Cardiovascular:     Rate and Rhythm: Normal rate and regular rhythm.  Pulmonary:     Effort: No respiratory distress.     Breath sounds: Normal breath sounds. No wheezing.  Abdominal:     General: Bowel sounds are normal.     Palpations: Abdomen is soft.     Tenderness: There is no abdominal tenderness.  Musculoskeletal:        General: No swelling or tenderness.     Cervical back: Neck supple. No tenderness.  Lymphadenopathy:     Cervical: No cervical adenopathy.  Skin:    Findings: No erythema or rash.  Neurological:     Mental Status: She is alert.  Psychiatric:        Mood and Affect: Mood normal.        Behavior: Behavior normal.         Outpatient Encounter Medications as of 09/23/2024  Medication Sig   bisoprolol -hydrochlorothiazide  (ZIAC ) 2.5-6.25 MG tablet TAKE 1 TABLET BY MOUTH DAILY FOR HIGH BLOOD PRESSURE   cephALEXin  (KEFLEX ) 500 MG capsule Take 1 capsule (500 mg total) by mouth 3 (three) times daily for 7 days.   cholecalciferol  (VITAMIN D3) 25 MCG (1000 UNIT) tablet Take 1,000 Units by mouth daily.   clobetasol  ointment (TEMOVATE ) 0.05 % Apply 1 Application topically 2 (two) times daily. For  5 days   clopidogrel  (PLAVIX ) 75 MG tablet TAKE 1 TABLET BY MOUTH DAILY   diphenoxylate -atropine  (LOMOTIL ) 2.5-0.025 MG tablet TAKE 1 TABLET BY MOUTH 4 TIMES DAILY AS NEEDED FOR DIARRHEA OR LOOSE STOOLS   Loperamide  HCl (IMODIUM  PO) Take by mouth as needed.   meloxicam  (MOBIC ) 7.5 MG tablet TAKE 1 TABLET BY MOUTH TWICE A DAY   Misc Natural Products (ALLERGY RELEAF SYSTEM PO) Take by mouth. Reported on 03/20/2016   nystatin  (MYCOSTATIN /NYSTOP ) powder Apply 1 Application topically 2 (two) times daily.   nystatin  cream (MYCOSTATIN ) Apply 1 application topically 2 (two) times daily.   rosuvastatin  (CRESTOR ) 10 MG tablet TAKE 1 TABLET BY MOUTH DAILY. INCREASED FROM 5 MG.   tiZANidine  (ZANAFLEX ) 4 MG tablet TAKE 1 TABLET BY MOUTH AT BEDTIME AS NEEDED FOR MUSCLE SPASMS   torsemide  (DEMADEX ) 20 MG tablet TAKE 1 TABLET BY MOUTH EVERY DAY AS NEEDED   Vaginal Lubricant (REPHRESH) GEL Place 1 applicator vaginally every 3 (three) days.   vitamin B-12 (CYANOCOBALAMIN ) 250 MCG tablet Take 250 mcg by mouth daily.   busPIRone  (BUSPAR ) 5 MG tablet Take 1 tablet (5 mg total) by mouth 2 (two) times daily.   citalopram  (CELEXA ) 40 MG tablet Take 1 tablet (40 mg total) by mouth daily.   gabapentin  (NEURONTIN ) 300 MG capsule Take 1 capsule (300 mg total) by mouth at bedtime.   [DISCONTINUED] busPIRone  (BUSPAR ) 5 MG tablet TAKE  1 TABLET BY MOUTH TWICE A DAY   [DISCONTINUED] citalopram  (CELEXA ) 40 MG tablet TAKE 1 TABLET BY MOUTH DAILY   [DISCONTINUED] gabapentin  (NEURONTIN ) 300 MG capsule TAKE 1 CAPSULE BY MOUTH AT BEDTIME   No facility-administered encounter medications on file as of 09/23/2024.     Lab Results  Component Value Date   WBC 6.3 09/29/2023   HGB 12.8 09/29/2023   HCT 38.8 09/29/2023   PLT 159 09/29/2023   GLUCOSE 93 06/08/2024   CHOL 131 06/08/2024   TRIG 101.0 06/08/2024   HDL 50.40 06/08/2024   LDLCALC 60 06/08/2024   ALT 14 06/08/2024   AST 18 06/08/2024   NA 137 06/08/2024   K 4.4  06/08/2024   CL 99 06/08/2024   CREATININE 1.03 06/08/2024   BUN 22 06/08/2024   CO2 29 06/08/2024   TSH 1.10 10/08/2023   INR 0.9 06/12/2023   HGBA1C 5.7 06/08/2024    MR LUMBAR SPINE WO CONTRAST Result Date: 08/11/2024 CLINICAL DATA:  Right lower extremity radicular pain EXAM: MRI LUMBAR SPINE WITHOUT CONTRAST TECHNIQUE: Multiplanar, multisequence MR imaging of the lumbar spine was performed. No intravenous contrast was administered. COMPARISON:  10/08/2021 FINDINGS: Segmentation:  Standard. Alignment:  Physiologic. Vertebrae: No acute fracture, evidence of discitis, or aggressive bone lesion. Conus medullaris and cauda equina: Conus extends to the T12-L1 level. Conus and cauda equina appear normal. Paraspinal and other soft tissues: No acute paraspinal abnormality. Disc levels: Disc spaces: Moderate disc height loss at L4-5. Minimal disc height loss at L3-4 and L5-S1. T12-L1: No significant disc bulge. No neural foraminal stenosis. No central canal stenosis. L1-L2: No significant disc bulge. Mild bilateral facet arthropathy. No foraminal or central canal stenosis. L2-L3: No significant disc bulge. Mild bilateral facet arthropathy. No foraminal or central canal stenosis. L3-L4: Mild disc bulge with a small left subarticular disc protrusion. Mild bilateral facet arthropathy. Mild central canal stenosis. No foraminal stenosis. L4-L5: Mild disc bulge. Mild bilateral facet arthropathy. No central canal stenosis. No right foraminal stenosis. Mild left foraminal stenosis. L5-S1: No disc herniation. No foraminal or central canal stenosis. Mild right and moderate left facet arthropathy. IMPRESSION: 1. At L3-4 there is a mild disc bulge with a small left subarticular disc protrusion. Mild bilateral facet arthropathy. Mild central canal stenosis. 2. At L4-5 there is a mild disc bulge. Mild bilateral facet arthropathy. Mild left foraminal stenosis. 3. At L5-S1 there is mild right and moderate left facet  arthropathy. 4. No acute osseous injury of the lumbar spine. Electronically Signed   By: Julaine Blanch M.D.   On: 08/11/2024 17:40       Assessment & Plan:  Hypercholesteremia Assessment & Plan: Continue crestor . Low cholesterol diet and exercise. Follow lipid panel.   Orders: -     Lipid panel; Future -     Hepatic function panel; Future -     CBC with Differential/Platelet; Future -     TSH; Future  Type 2 diabetes mellitus with hyperglycemia, without long-term current use of insulin (HCC) Assessment & Plan: Low carb diet and exercise. Follow met b and A1c.  Lab Results  Component Value Date   HGBA1C 5.7 06/08/2024     Orders: -     Microalbumin / creatinine urine ratio; Future -     Hemoglobin A1c; Future -     Basic metabolic panel with GFR; Future  Primary hypertension Assessment & Plan: Continue on torsemide  and ziac .  Follow pressures.  Follow metabolic panel.   Orders: -  Basic metabolic panel with GFR; Future  Vitamin D  deficiency Assessment & Plan: Follow vitamin D  level.   Orders: -     VITAMIN D  25 Hydroxy (Vit-D Deficiency, Fractures); Future  Health care maintenance Assessment & Plan: Physical today 09/23/24.  Colonoscopy 08/31/21 - internal hemorrhoids. .  Mammogram 03/12/24- Birads I.    Iron deficiency anemia, unspecified iron deficiency anemia type Assessment & Plan: Follow cbc.    Moderate major depression (HCC) Assessment & Plan: On citalopram . Was on Trintilex. Felt was helping.  Not covered by insurance.  Attempted to get assistance.  Have previously discussed referral to psychiatry to help with medication management. Has tried prozac - not help.  Continues on citalopram . Wanted to avoid benzodiazepines. Taking buspar . Discussed increased stress. Discussed counseling. No SI. Agreeable today for referral to psychiatry.   Orders: -     Ambulatory referral to Psychiatry  History of renal cell carcinoma Assessment & Plan: S/p left radical  nephrectomy and partial adrenalectomy.  Seeing Dr Babara. Had f/u with Dr Babara - 09/29/23 - f/u [facility]. Stable. Recommended f/u in one year.   Hyperglycemia Assessment & Plan: Low carb diet and exercise. Follow met b anda 1c.   Lab Results  Component Value Date   HGBA1C 5.7 06/08/2024      SVT (supraventricular tachycardia) Assessment & Plan: Stable.    Other orders -     busPIRone  HCl; Take 1 tablet (5 mg total) by mouth 2 (two) times daily.  Dispense: 60 tablet; Refill: 1 -     Citalopram  Hydrobromide; Take 1 tablet (40 mg total) by mouth daily.  Dispense: 90 tablet; Refill: 1 -     Gabapentin ; Take 1 capsule (300 mg total) by mouth at bedtime.  Dispense: 30 capsule; Refill: 1     Allena Hamilton, MD "

## 2024-09-26 ENCOUNTER — Encounter: Payer: Self-pay | Admitting: Internal Medicine

## 2024-09-26 NOTE — Assessment & Plan Note (Signed)
 Continue on torsemide and ziac.  Follow pressures.  Follow metabolic panel.

## 2024-09-26 NOTE — Assessment & Plan Note (Signed)
 S/p left radical nephrectomy and partial adrenalectomy.  Seeing Dr Babara. Had f/u with Dr Babara - 09/29/23 - f/u [facility]. Stable. Recommended f/u in one year.

## 2024-09-26 NOTE — Assessment & Plan Note (Signed)
 Follow cbc.

## 2024-09-26 NOTE — Assessment & Plan Note (Signed)
 Low carb diet and exercise. Follow met b and A1c.  Lab Results  Component Value Date   HGBA1C 5.7 06/08/2024

## 2024-09-26 NOTE — Assessment & Plan Note (Signed)
Follow vitamin D level.  

## 2024-09-26 NOTE — Assessment & Plan Note (Signed)
 Low carb diet and exercise. Follow met b anda 1c.   Lab Results  Component Value Date   HGBA1C 5.7 06/08/2024

## 2024-09-26 NOTE — Assessment & Plan Note (Signed)
Continue crestor.  Low cholesterol diet and exercise.  Follow lipid panel.  

## 2024-09-26 NOTE — Assessment & Plan Note (Signed)
 On citalopram . Was on Trintilex. Felt was helping.  Not covered by insurance.  Attempted to get assistance.  Have previously discussed referral to psychiatry to help with medication management. Has tried prozac - not help.  Continues on citalopram . Wanted to avoid benzodiazepines. Taking buspar . Discussed increased stress. Discussed counseling. No SI. Agreeable today for referral to psychiatry.

## 2024-09-26 NOTE — Assessment & Plan Note (Signed)
 Stable

## 2024-09-28 ENCOUNTER — Other Ambulatory Visit: Payer: Medicare Other

## 2024-09-28 ENCOUNTER — Encounter: Payer: Self-pay | Admitting: Oncology

## 2024-09-28 ENCOUNTER — Inpatient Hospital Stay: Attending: Oncology

## 2024-09-28 ENCOUNTER — Ambulatory Visit: Payer: Medicare Other | Admitting: Oncology

## 2024-09-28 ENCOUNTER — Inpatient Hospital Stay: Admitting: Oncology

## 2024-09-28 VITALS — BP 127/83 | HR 62 | Temp 97.6°F | Resp 20 | Wt 222.9 lb

## 2024-09-28 DIAGNOSIS — Z85528 Personal history of other malignant neoplasm of kidney: Secondary | ICD-10-CM

## 2024-09-28 DIAGNOSIS — D72829 Elevated white blood cell count, unspecified: Secondary | ICD-10-CM | POA: Diagnosis not present

## 2024-09-28 LAB — CBC WITH DIFFERENTIAL (CANCER CENTER ONLY)
Abs Immature Granulocytes: 0.11 10*3/uL — ABNORMAL HIGH (ref 0.00–0.07)
Basophils Absolute: 0 10*3/uL (ref 0.0–0.1)
Basophils Relative: 0 %
Eosinophils Absolute: 0 10*3/uL (ref 0.0–0.5)
Eosinophils Relative: 0 %
HCT: 43.3 % (ref 36.0–46.0)
Hemoglobin: 14.3 g/dL (ref 12.0–15.0)
Immature Granulocytes: 1 %
Lymphocytes Relative: 9 %
Lymphs Abs: 1 10*3/uL (ref 0.7–4.0)
MCH: 29.4 pg (ref 26.0–34.0)
MCHC: 33 g/dL (ref 30.0–36.0)
MCV: 89.1 fL (ref 80.0–100.0)
Monocytes Absolute: 0.2 10*3/uL (ref 0.1–1.0)
Monocytes Relative: 2 %
Neutro Abs: 10.2 10*3/uL — ABNORMAL HIGH (ref 1.7–7.7)
Neutrophils Relative %: 88 %
Platelet Count: 224 10*3/uL (ref 150–400)
RBC: 4.86 MIL/uL (ref 3.87–5.11)
RDW: 13.3 % (ref 11.5–15.5)
WBC Count: 11.5 10*3/uL — ABNORMAL HIGH (ref 4.0–10.5)
nRBC: 0 % (ref 0.0–0.2)

## 2024-09-28 LAB — CMP (CANCER CENTER ONLY)
ALT: 11 U/L (ref 0–44)
AST: 16 U/L (ref 15–41)
Albumin: 4.6 g/dL (ref 3.5–5.0)
Alkaline Phosphatase: 89 U/L (ref 38–126)
Anion gap: 13 (ref 5–15)
BUN: 34 mg/dL — ABNORMAL HIGH (ref 8–23)
CO2: 22 mmol/L (ref 22–32)
Calcium: 10.1 mg/dL (ref 8.9–10.3)
Chloride: 102 mmol/L (ref 98–111)
Creatinine: 0.97 mg/dL (ref 0.44–1.00)
GFR, Estimated: >60 mL/min
Glucose, Bld: 110 mg/dL — ABNORMAL HIGH (ref 70–99)
Potassium: 4.1 mmol/L (ref 3.5–5.1)
Sodium: 137 mmol/L (ref 135–145)
Total Bilirubin: 0.4 mg/dL (ref 0.0–1.2)
Total Protein: 7.1 g/dL (ref 6.5–8.1)

## 2024-09-28 LAB — LACTATE DEHYDROGENASE: LDH: 187 U/L (ref 105–235)

## 2024-09-28 NOTE — Progress Notes (Signed)
 " Hematology/Oncology follow up note  [phone] [phone]  REASON FOR VISIT Follow up for renal cell carcinoma  ASSESSMENT & PLAN:   Personal history of renal cell carcinoma # History of Stage II renal cell carcinoma, radical nephrectomy 07/24/2015  09/21/2020 CT showed no evidence of disease recurrence or metastatic disease. It has been slightly more than 9 years since her nephrectomy surgery. Clinically patient is doing very well. Labs reviewed and discussed with patient.  Stable LDH. Discussed about future images will be clinically indicated. Follow up PRN  Leukocytosis Leukocytosis is likely reactive, due to recent UTI.   No orders of the defined types were placed in this encounter.  Follow-up PRN All questions were answered. The patient knows to call the clinic with any problems, questions or concerns.  Zelphia Cap, MD, PhD [facility] Hematology Oncology 09/28/2024    HISTORY OF PRESENTING ILLNESS:  Jacqueline Nichols is a  69 y.o.  Nichols with PMH listed below who was referred to me for evaluation of history of renal cell carcinoma Patient follows up with [facility] urology and want to switch her care to local. Extensive medical chart review was performed by me. History of Stage II left renal cell carcinoma.  In 2016, she was found to have a left renal mass 7.9 cm x 2.6 cm x 9.3 cm.  Underwent laparoscopic left radical nephrectomy with Dr.Matthew Reyna on 07/18/2015.  Per Note in care everywhere, patient has clear cell renal cell carcinoma, ISU P nuclear grade 2, pT2a, margins of resections are negative for tumor, no metastatic carcinoma identified in 1 hilar lymph nodes (0/1), background kidney with scattered sclerotic glomeruli; histologically unremarkable adrenal gland.    Former smoker  INTERVAL HISTORY Jacqueline Nichols is a 69 y.o. Nichols who has above history reviewed by me today presents for follow up visit for management of history of renal cell  carcinoma  Patient reports feeling well.  Denies any unintentional weight loss, night sweats, fever, abdominal pain, blood in urine, or bone pain. She reports to have good appetite. Recent UTI, treated with antibiotics.  + life stressors.    Review of Systems  Constitutional:  Negative for chills, fever, malaise/fatigue and weight loss.  HENT:  Negative for sore throat.   Eyes:  Negative for redness.  Respiratory:  Negative for cough, shortness of breath and wheezing.   Cardiovascular:  Negative for chest pain, palpitations and leg swelling.  Gastrointestinal:  Negative for abdominal pain, blood in stool, nausea and vomiting.  Genitourinary:  Negative for dysuria.  Musculoskeletal:  Negative for myalgias.  Skin:  Negative for rash.  Neurological:  Negative for dizziness, tingling and tremors.  Endo/Heme/Allergies:  Does not bruise/bleed easily.  Psychiatric/Behavioral:  Negative for hallucinations.     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   Urinary incontinence     SURGICAL HISTORY: Past Surgical History:  Procedure Laterality Date   CHOLECYSTECTOMY  2001   COLONOSCOPY WITH PROPOFOL  N/A 03/27/2016   Procedure: COLONOSCOPY WITH PROPOFOL ;  Surgeon: Rogelia Copping, MD;  Location: [facility] ENDOSCOPY;  Service: Endoscopy;  Laterality: N/A;   COLONOSCOPY WITH PROPOFOL  N/A 08/31/2021  Procedure: COLONOSCOPY WITH PROPOFOL ;  Surgeon: Jinny Carmine, MD;  Location: [facility];  Service: Endoscopy;  Laterality: N/A;   KNEE SURGERY  2003-2009   s/p 8 knee surgeries (with 3 knee replacements)   NEPHRECTOMY RADICAL  07/2015   NISSEN FUNDOPLICATION      SOCIAL HISTORY: Social History   Socioeconomic History   Marital status:  Divorced    Spouse name: Not on file   Number of children: 2   Years of education: Not on file   Highest education level: Not on file  Occupational History   Not on file  Tobacco Use   Smoking status: Former    Current packs/day: 0.00    Average packs/day: 1 pack/day for 15.0 years (15.0 ttl pk-yrs)    Types: Cigarettes    Start date: 10/23/1997    Quit date: 10/23/2012    Years since quitting: 11.9   Smokeless tobacco: Never   Tobacco comments:    Quit Feb 2014  Vaping Use   Vaping status: Former   Start date: 02/20/2013   Quit date: 02/20/2014  Substance and Sexual Activity   Alcohol use: Yes    Alcohol/week: 1.0 standard drink of alcohol    Types: 1 Glasses of wine per week    Comment: social drinking - approx 2-4 monthly    Drug use: No   Sexual activity: Not Currently  Other Topics Concern   Not on file  Social History Narrative   Not on file   Social Drivers of Health   Tobacco Use: Medium Risk (09/28/2024)   Patient History    Smoking Tobacco Use: Former    Smokeless Tobacco Use: Never    Passive Exposure: Not on file  Financial Resource Strain: Low Risk  (09/24/2024)   Received from [facility]   Overall Financial Resource Strain (CARDIA)    Difficulty of Paying Living Expenses: Not very hard  Food Insecurity: No Food Insecurity (09/24/2024)   Received from [facility]   Epic    Within the past 12 months, you worried that your food would run out before you got the money to buy more.: Never true    Within the past 12 months, the food you bought just didn't last and you didn't have money to get more.: Never true  Transportation Needs: No Transportation Needs (09/24/2024)   Received from [facility] - Transportation    In the past 12 months, has lack of transportation kept you from medical appointments or from getting medications?: No    Lack of Transportation (Non-Medical): No  Physical Activity: Not  on file  Stress: Not on file  Social Connections: Not on file  Intimate Partner Violence: Not At Risk (06/12/2023)   Humiliation, Afraid, Rape, and Kick questionnaire    Fear of Current or Ex-Partner: No    Emotionally Abused: No    Physically Abused: No    Sexually Abused: No  Depression (PHQ2-9): High Risk (09/23/2024)   Depression (PHQ2-9)    PHQ-2 Score: 21  Alcohol Screen: Not on file  Housing: Low Risk  (09/24/2024)   Received from [facility]   Epic    In the last 12 months, was there a time when you were not able to pay the mortgage or rent on time?: No    In the past 12 months, how many times have you moved where you were living?: 0    At any time in  the past 12 months, were you homeless or living in a shelter (including now)?: No  Utilities: Not At Risk (09/24/2024)   Received from [facility]   Epic    In the past 12 months has the electric, gas, oil, or water  company threatened to shut off services in your home?: No  Health Literacy: Not on file    FAMILY HISTORY: Family History  Problem Relation Age of Onset   Colon cancer Mother        died - age 42   Heart disease Father    Hypertension Father    Lymphoma Father    Arthritis/Rheumatoid Sister    Lung cancer Brother        smoked   Arthritis/Rheumatoid Paternal Grandmother    Cancer Maternal Uncle        Unknown type of cancer    Cancer Paternal Aunt        Unknown type of cancer    Throat cancer Paternal Uncle    Colon cancer Paternal Uncle    Breast cancer Neg Hx     ALLERGIES:  is allergic to codeine and vicodin [hydrocodone-acetaminophen ].  MEDICATIONS:  Current Outpatient Medications  Medication Sig Dispense Refill   bisoprolol -hydrochlorothiazide  (ZIAC ) 2.5-6.25 MG tablet TAKE 1 TABLET BY MOUTH DAILY FOR HIGH BLOOD PRESSURE 90 tablet 3   busPIRone  (BUSPAR ) 5 MG tablet Take 1 tablet (5 mg total) by mouth 2 (two) times daily. 60 tablet 1   cholecalciferol   (VITAMIN D3) 25 MCG (1000 UNIT) tablet Take 1,000 Units by mouth daily.     citalopram  (CELEXA ) 40 MG tablet Take 1 tablet (40 mg total) by mouth daily. 90 tablet 1   clobetasol  ointment (TEMOVATE ) 0.05 % Apply 1 Application topically 2 (two) times daily. For 5 days 60 g 0   clopidogrel  (PLAVIX ) 75 MG tablet TAKE 1 TABLET BY MOUTH DAILY 90 tablet 3   diphenoxylate -atropine  (LOMOTIL ) 2.5-0.025 MG tablet TAKE 1 TABLET BY MOUTH 4 TIMES DAILY AS NEEDED FOR DIARRHEA OR LOOSE STOOLS 120 tablet 5   gabapentin  (NEURONTIN ) 300 MG capsule Take 1 capsule (300 mg total) by mouth at bedtime. 30 capsule 1   Loperamide  HCl (IMODIUM  PO) Take by mouth as needed.     magnesium gluconate (MAGONATE) 500 (27 Mg) MG TABS tablet Take 500 mg by mouth in the morning and at bedtime.     meloxicam  (MOBIC ) 7.5 MG tablet TAKE 1 TABLET BY MOUTH TWICE A DAY 60 tablet 5   Misc Natural Products (ALLERGY RELEAF SYSTEM PO) Take by mouth. Reported on 03/20/2016     nystatin  (MYCOSTATIN /NYSTOP ) powder Apply 1 Application topically 2 (two) times daily. 60 g 0   nystatin  cream (MYCOSTATIN ) Apply 1 application topically 2 (two) times daily. 30 g 0   predniSONE (DELTASONE) 10 MG tablet Take 6 tablets daily for 2 days, then 5 tablets x 2 days, then 4 tablets x 2 days, then 3 tablets x 2 days, then 2 tablets x 2 days, then 1 tablet x 2 days     rosuvastatin  (CRESTOR ) 10 MG tablet TAKE 1 TABLET BY MOUTH DAILY. INCREASED FROM 5 MG. 30 tablet 11   tiZANidine  (ZANAFLEX ) 4 MG tablet TAKE 1 TABLET BY MOUTH AT BEDTIME AS NEEDED FOR MUSCLE SPASMS 30 tablet 1   torsemide  (DEMADEX ) 20 MG tablet TAKE 1 TABLET BY MOUTH EVERY DAY AS NEEDED 90 tablet 1   Vaginal Lubricant (REPHRESH) GEL Place 1 applicator vaginally every 3 (three) days. 8 g 12  vitamin B-12 (CYANOCOBALAMIN ) 250 MCG tablet Take 250 mcg by mouth daily.     No current facility-administered medications for this visit.     PHYSICAL EXAMINATION: ECOG PERFORMANCE STATUS: 0 -  Asymptomatic Vitals:   09/28/24 1338  BP: 127/83  Pulse: 62  Resp: 20  Temp: 97.6 F (36.4 C)  SpO2: 100%   Filed Weights   09/28/24 1338  Weight: 222 lb 14.4 oz (101.1 kg)    Physical Exam Constitutional:      General: She is not in acute distress. HENT:     Head: Normocephalic and atraumatic.  Eyes:     General: No scleral icterus. Cardiovascular:     Rate and Rhythm: Normal rate.  Pulmonary:     Effort: Pulmonary effort is normal. No respiratory distress.     Breath sounds: Normal breath sounds. No wheezing.  Abdominal:     General: Bowel sounds are normal. There is no distension.     Palpations: Abdomen is soft. There is no mass.     Tenderness: There is no abdominal tenderness.  Musculoskeletal:        General: No deformity. Normal range of motion.     Cervical back: Normal range of motion and neck supple.  Lymphadenopathy:     Cervical: No cervical adenopathy.  Skin:    General: Skin is warm and dry.     Findings: No erythema or rash.  Neurological:     Mental Status: She is alert and oriented to person, place, and time. Mental status is at baseline.  Psychiatric:        Mood and Affect: Mood normal.      LABORATORY DATA:  I have reviewed the data as listed Lab Results  Component Value Date   WBC 11.5 (H) 09/28/2024   HGB 14.3 09/28/2024   HCT 43.3 09/28/2024   MCV 89.1 09/28/2024   PLT 224 09/28/2024   Recent Labs    10/08/23 1549 02/10/24 1545 06/08/24 1423 09/28/24 1331  NA 138 139 137 137  K 4.5 4.6 4.4 4.1  CL 102 104 99 102  CO2 28 21 29 22   GLUCOSE 105* 86 93 110*  BUN 21 27 22  34*  CREATININE 1.02 1.07* 1.03 0.97  CALCIUM  9.3 9.5 10.3 10.1  GFRNONAA  --   --   --  >60  PROT 6.3 6.3 6.9 7.1  ALBUMIN 4.2 4.5 4.8 4.6  AST 15 14 18 16   ALT 10 11 14 11   ALKPHOS 73 96 75 89  BILITOT 0.5 0.5 0.7 0.4  BILIDIR 0.1 0.18 0.1  --    "

## 2024-09-28 NOTE — Assessment & Plan Note (Addendum)
#   History of Stage II renal cell carcinoma, radical nephrectomy 07/24/2015  09/21/2020 CT showed no evidence of disease recurrence or metastatic disease. It has been slightly more than 9 years since her nephrectomy surgery. Clinically patient is doing very well. Labs reviewed and discussed with patient.  Stable LDH. Discussed about future images will be clinically indicated. Follow up PRN

## 2024-09-28 NOTE — Assessment & Plan Note (Signed)
 Leukocytosis is likely reactive, due to recent UTI.

## 2024-09-30 ENCOUNTER — Telehealth: Payer: Self-pay | Admitting: Internal Medicine

## 2024-09-30 NOTE — Telephone Encounter (Signed)
 Copied from CRM #[id number]. Topic: Referral - Status >> Sep 30, 2024  1:51 PM Zy'onna H wrote: Reason for CRM: Vina from they Psychiatry office of Viviane MARLA Drone, MD.  She called to inform we are not in network with this provider and they will have to send a referral to alternative provider.

## 2024-10-01 ENCOUNTER — Other Ambulatory Visit: Payer: Self-pay | Admitting: Internal Medicine

## 2024-10-08 ENCOUNTER — Telehealth: Payer: Self-pay

## 2024-10-08 MED ORDER — TORSEMIDE 20 MG PO TABS: 20.0000 mg | ORAL_TABLET | Freq: Every day | ORAL | 1 refills | Status: AC | PRN

## 2024-10-08 NOTE — Telephone Encounter (Signed)
 Refill sent

## 2024-10-08 NOTE — Telephone Encounter (Signed)
 Copied from CRM [id number]. Topic: Clinical - Medication Refill >> Oct 08, 2024 10:56 AM Robinson H wrote: Medication: torsemide  (DEMADEX ) 20 MG tablet  Has the patient contacted their pharmacy? Yes, pharmacy needs a new prescription only takes as needed (Agent: If no, request that the patient contact the pharmacy for the refill. If patient does not wish to contact the pharmacy document the reason why and proceed with request.) (Agent: If yes, when and what did the pharmacy advise?)  This is the patient's preferred pharmacy:  [facility] - [place], [place] - [address] [place] [address] Phone: [phone] Fax: [phone]  Is this the correct pharmacy for this prescription? Yes If no, delete pharmacy and type the correct one.   Has the prescription been filled recently? No  Is the patient out of the medication? Yes  Has the patient been seen for an appointment in the last year OR does the patient have an upcoming appointment? Yes  Can we respond through MyChart? Yes  Agent: Please be advised that Rx refills may take up to 3 business days. We ask that you follow-up with your pharmacy.

## 2024-10-08 NOTE — Progress Notes (Signed)
 Jacqueline Nichols                                          MRN: [id number]   10/08/2024   The [facility] Quality Team Specialist reviewed this patient medical record for the purposes of chart review for care gap closure. The following were reviewed: chart review for care gap closure-kidney health evaluation for diabetes:eGFR  and uACR.    [facility] Quality Team

## 2024-12-23 ENCOUNTER — Ambulatory Visit: Admitting: Internal Medicine
